# Patient Record
Sex: Male | Born: 1935 | Race: White | Hispanic: No | Marital: Married | Smoking: Former smoker
Health system: Southern US, Community
[De-identification: ages and names within clinical notes are randomized; demographics above are authoritative.]

## PROBLEM LIST (undated history)

## (undated) DIAGNOSIS — F329 Major depressive disorder, single episode, unspecified: Secondary | ICD-10-CM

## (undated) DIAGNOSIS — N529 Male erectile dysfunction, unspecified: Secondary | ICD-10-CM

## (undated) DIAGNOSIS — F411 Generalized anxiety disorder: Secondary | ICD-10-CM

## (undated) DIAGNOSIS — I10 Essential (primary) hypertension: Secondary | ICD-10-CM

## (undated) DIAGNOSIS — K8070 Calculus of gallbladder and bile duct without cholecystitis without obstruction: Secondary | ICD-10-CM

## (undated) DIAGNOSIS — D34 Benign neoplasm of thyroid gland: Secondary | ICD-10-CM

## (undated) DIAGNOSIS — R519 Headache, unspecified: Secondary | ICD-10-CM

## (undated) DIAGNOSIS — J449 Chronic obstructive pulmonary disease, unspecified: Secondary | ICD-10-CM

## (undated) DIAGNOSIS — J984 Other disorders of lung: Secondary | ICD-10-CM

## (undated) DIAGNOSIS — N183 Chronic kidney disease, stage 3 unspecified: Secondary | ICD-10-CM

## (undated) DIAGNOSIS — E785 Hyperlipidemia, unspecified: Secondary | ICD-10-CM

## (undated) DIAGNOSIS — R21 Rash and other nonspecific skin eruption: Secondary | ICD-10-CM

## (undated) DIAGNOSIS — R911 Solitary pulmonary nodule: Secondary | ICD-10-CM

## (undated) DIAGNOSIS — I4891 Unspecified atrial fibrillation: Secondary | ICD-10-CM

## (undated) DIAGNOSIS — E041 Nontoxic single thyroid nodule: Principal | ICD-10-CM

## (undated) DIAGNOSIS — F0390 Unspecified dementia without behavioral disturbance: Secondary | ICD-10-CM

## (undated) HISTORY — DX: Hyperlipidemia, unspecified: E78.5

## (undated) HISTORY — DX: Nontoxic single thyroid nodule: E04.1

## (undated) HISTORY — DX: Rash and other nonspecific skin eruption: R21

## (undated) HISTORY — DX: Solitary pulmonary nodule: R91.1

## (undated) HISTORY — DX: Chronic obstructive pulmonary disease, unspecified: J44.9

## (undated) HISTORY — DX: Calculus of gallbladder and bile duct without cholecystitis without obstruction: K80.70

## (undated) HISTORY — DX: Male erectile dysfunction, unspecified: N52.9

## (undated) HISTORY — DX: Major depressive disorder, single episode, unspecified: F32.9

## (undated) HISTORY — DX: Generalized anxiety disorder: F41.1

## (undated) HISTORY — DX: Other disorders of lung: J98.4

## (undated) HISTORY — DX: Benign neoplasm of thyroid gland: D34

---

## 1996-06-09 HISTORY — PX: CARDIAC CATHETERIZATION: SHX172

## 2008-05-17 ENCOUNTER — Encounter: Admission: RE | Admit: 2008-05-17 | Discharge: 2008-05-17 | Payer: Self-pay | Admitting: Family Medicine

## 2009-05-16 ENCOUNTER — Encounter: Payer: Self-pay | Admitting: Internal Medicine

## 2009-05-31 ENCOUNTER — Encounter: Payer: Self-pay | Admitting: Internal Medicine

## 2009-06-18 ENCOUNTER — Ambulatory Visit: Payer: Self-pay | Admitting: Internal Medicine

## 2009-06-18 DIAGNOSIS — J984 Other disorders of lung: Secondary | ICD-10-CM

## 2009-06-18 DIAGNOSIS — N529 Male erectile dysfunction, unspecified: Secondary | ICD-10-CM | POA: Insufficient documentation

## 2009-06-18 DIAGNOSIS — L989 Disorder of the skin and subcutaneous tissue, unspecified: Secondary | ICD-10-CM | POA: Insufficient documentation

## 2009-06-18 DIAGNOSIS — E785 Hyperlipidemia, unspecified: Secondary | ICD-10-CM

## 2009-06-18 HISTORY — DX: Hyperlipidemia, unspecified: E78.5

## 2009-06-18 HISTORY — DX: Other disorders of lung: J98.4

## 2009-06-18 HISTORY — DX: Male erectile dysfunction, unspecified: N52.9

## 2009-06-20 LAB — CONVERTED CEMR LAB
ALT: 25 units/L (ref 0–53)
AST: 21 units/L (ref 0–37)
Bilirubin Urine: NEGATIVE
CO2: 31 meq/L (ref 19–32)
Calcium: 9.8 mg/dL (ref 8.4–10.5)
Cholesterol: 244 mg/dL — ABNORMAL HIGH (ref 0–200)
Creatinine, Ser: 1.4 mg/dL (ref 0.4–1.5)
Creatinine,U: 250.4 mg/dL
Direct LDL: 159.8 mg/dL
Eosinophils Relative: 2.7 % (ref 0.0–5.0)
GFR calc non Af Amer: 52.99 mL/min (ref >60)
HCT: 49.4 % (ref 39.0–52.0)
Hemoglobin: 16.4 g/dL (ref 13.0–17.0)
Ketones, ur: NEGATIVE mg/dL
Lymphocytes Relative: 17.2 % (ref 12.0–46.0)
Lymphs Abs: 1.3 10*3/uL (ref 0.7–4.0)
Microalb Creat Ratio: 4 mg/g (ref 0.0–30.0)
Monocytes Relative: 8.4 % (ref 3.0–12.0)
Neutro Abs: 5.2 10*3/uL (ref 1.4–7.7)
PSA: 1.11 ng/mL (ref 0.10–4.00)
RBC: 5.41 M/uL (ref 4.22–5.81)
Sodium: 143 meq/L (ref 135–145)
Specific Gravity, Urine: >=1.03 (ref 1.000–1.030)
Total Protein: 7.4 g/dL (ref 6.0–8.3)
Triglycerides: 275 mg/dL — ABNORMAL HIGH (ref 0.0–149.0)
Urine Glucose: NEGATIVE mg/dL
WBC: 7.3 10*3/uL (ref 4.5–10.5)
pH: 5 (ref 5.0–8.0)

## 2009-06-21 ENCOUNTER — Ambulatory Visit: Payer: Self-pay | Admitting: Internal Medicine

## 2009-06-21 ENCOUNTER — Telehealth: Payer: Self-pay | Admitting: Internal Medicine

## 2009-06-21 DIAGNOSIS — IMO0002 Reserved for concepts with insufficient information to code with codable children: Secondary | ICD-10-CM

## 2009-06-21 DIAGNOSIS — M542 Cervicalgia: Secondary | ICD-10-CM | POA: Insufficient documentation

## 2009-06-22 DIAGNOSIS — F329 Major depressive disorder, single episode, unspecified: Secondary | ICD-10-CM

## 2009-06-22 DIAGNOSIS — F3289 Other specified depressive episodes: Secondary | ICD-10-CM

## 2009-06-22 DIAGNOSIS — F418 Other specified anxiety disorders: Secondary | ICD-10-CM

## 2009-06-22 HISTORY — DX: Other specified depressive episodes: F32.89

## 2009-06-22 HISTORY — DX: Major depressive disorder, single episode, unspecified: F32.9

## 2009-07-17 ENCOUNTER — Telehealth: Payer: Self-pay | Admitting: Internal Medicine

## 2009-07-19 ENCOUNTER — Ambulatory Visit: Payer: Self-pay | Admitting: Internal Medicine

## 2009-07-19 LAB — CONVERTED CEMR LAB
Albumin: 3.9 g/dL (ref 3.5–5.2)
Alkaline Phosphatase: 72 units/L (ref 39–117)
Cholesterol: 139 mg/dL (ref 0–200)
HDL: 43.8 mg/dL (ref >39.00)
LDL Cholesterol: 61 mg/dL (ref 0–99)
Total CHOL/HDL Ratio: 3
Total Protein: 7.1 g/dL (ref 6.0–8.3)
VLDL: 34.4 mg/dL (ref 0.0–40.0)

## 2009-07-25 ENCOUNTER — Ambulatory Visit: Payer: Self-pay | Admitting: Internal Medicine

## 2009-07-26 ENCOUNTER — Encounter: Payer: Self-pay | Admitting: Internal Medicine

## 2009-10-10 ENCOUNTER — Emergency Department (HOSPITAL_COMMUNITY): Admission: EM | Admit: 2009-10-10 | Discharge: 2009-10-10 | Payer: Self-pay | Admitting: Family Medicine

## 2009-10-22 ENCOUNTER — Encounter: Payer: Self-pay | Admitting: Internal Medicine

## 2009-10-22 ENCOUNTER — Telehealth: Payer: Self-pay | Admitting: Internal Medicine

## 2010-02-19 ENCOUNTER — Ambulatory Visit: Payer: Self-pay | Admitting: Internal Medicine

## 2010-02-19 ENCOUNTER — Encounter: Payer: Self-pay | Admitting: Internal Medicine

## 2010-02-20 ENCOUNTER — Telehealth: Payer: Self-pay | Admitting: Internal Medicine

## 2010-02-22 ENCOUNTER — Ambulatory Visit: Payer: Self-pay | Admitting: Internal Medicine

## 2010-02-23 LAB — CONVERTED CEMR LAB
BUN: 20 mg/dL (ref 6–23)
Calcium: 8.8 mg/dL (ref 8.4–10.5)
GFR calc non Af Amer: 55.15 mL/min (ref >60)
Glucose, Bld: 93 mg/dL (ref 70–99)
Potassium: 4.2 meq/L (ref 3.5–5.1)
Sodium: 141 meq/L (ref 135–145)

## 2010-03-04 ENCOUNTER — Telehealth: Payer: Self-pay | Admitting: Internal Medicine

## 2010-05-13 ENCOUNTER — Ambulatory Visit: Payer: Self-pay | Admitting: Internal Medicine

## 2010-05-13 DIAGNOSIS — M549 Dorsalgia, unspecified: Secondary | ICD-10-CM | POA: Insufficient documentation

## 2010-05-13 DIAGNOSIS — F411 Generalized anxiety disorder: Secondary | ICD-10-CM | POA: Insufficient documentation

## 2010-05-13 DIAGNOSIS — R5383 Other fatigue: Secondary | ICD-10-CM

## 2010-05-13 DIAGNOSIS — R5381 Other malaise: Secondary | ICD-10-CM

## 2010-05-13 HISTORY — DX: Generalized anxiety disorder: F41.1

## 2010-05-13 LAB — CONVERTED CEMR LAB
ALT: 19 units/L (ref 0–53)
Albumin: 4 g/dL (ref 3.5–5.2)
BUN: 15 mg/dL (ref 6–23)
Basophils Relative: 0.4 % (ref 0.0–3.0)
Bilirubin, Direct: 0.1 mg/dL (ref 0.0–0.3)
CO2: 32 meq/L (ref 19–32)
Chloride: 101 meq/L (ref 96–112)
Creatinine, Ser: 1.5 mg/dL (ref 0.4–1.5)
Eosinophils Absolute: 0.2 10*3/uL (ref 0.0–0.7)
Eosinophils Relative: 2.4 % (ref 0.0–5.0)
HCT: 45.8 % (ref 39.0–52.0)
Lymphs Abs: 1.2 10*3/uL (ref 0.7–4.0)
MCHC: 34.2 g/dL (ref 30.0–36.0)
MCV: 89.5 fL (ref 78.0–100.0)
Monocytes Absolute: 0.5 10*3/uL (ref 0.1–1.0)
Neutrophils Relative %: 74.7 % (ref 43.0–77.0)
Potassium: 4.1 meq/L (ref 3.5–5.1)
RBC: 5.11 M/uL (ref 4.22–5.81)
TSH: 2.32 microintl units/mL (ref 0.35–5.50)
Total Protein: 6.5 g/dL (ref 6.0–8.3)
WBC: 7.2 10*3/uL (ref 4.5–10.5)

## 2010-05-24 ENCOUNTER — Ambulatory Visit: Payer: Self-pay | Admitting: Internal Medicine

## 2010-06-24 ENCOUNTER — Ambulatory Visit: Payer: Self-pay | Admitting: Cardiology

## 2010-06-28 ENCOUNTER — Telehealth: Payer: Self-pay | Admitting: Internal Medicine

## 2010-06-28 DIAGNOSIS — K8070 Calculus of gallbladder and bile duct without cholecystitis without obstruction: Secondary | ICD-10-CM

## 2010-06-28 DIAGNOSIS — D34 Benign neoplasm of thyroid gland: Secondary | ICD-10-CM

## 2010-06-28 HISTORY — DX: Benign neoplasm of thyroid gland: D34

## 2010-06-28 HISTORY — DX: Calculus of gallbladder and bile duct without cholecystitis without obstruction: K80.70

## 2010-06-30 ENCOUNTER — Encounter: Payer: Self-pay | Admitting: Internal Medicine

## 2010-07-03 ENCOUNTER — Other Ambulatory Visit: Payer: Self-pay | Admitting: Internal Medicine

## 2010-07-03 DIAGNOSIS — K8020 Calculus of gallbladder without cholecystitis without obstruction: Secondary | ICD-10-CM

## 2010-07-03 DIAGNOSIS — E049 Nontoxic goiter, unspecified: Secondary | ICD-10-CM

## 2010-07-05 ENCOUNTER — Ambulatory Visit: Admit: 2010-07-05 | Payer: Self-pay | Admitting: Internal Medicine

## 2010-07-09 NOTE — Letter (Signed)
Summary: [facility]  [facility]   Imported By: Lester  07/12/2009 08:55:08  _____________________________________________________________________  External Attachment:    Type:   Image     Comment:   External Document

## 2010-07-09 NOTE — Progress Notes (Signed)
Summary: Letter  Phone Note Call from Patient Call back at Home Phone [phone]   Caller: Patient Summary of Call: pt's spouse called requesting a letter from MD stating that pt is under [facility]'s care and that he has had to go to ER. Pt's wife is requesting this to provide to [place] company to show financiall hardship so that she may be put on a payment plan. please advise Initial call taken by: Margaret Pyle, CMA,  Oct 22, 2009 3:48 PM  Follow-up for Phone Call        done hardcopy to [facility] side B - dahlia  Follow-up by: Corwin Levins MD,  Oct 22, 2009 5:43 PM  Additional Follow-up for Phone Call Additional follow up Details #1::        pt's spouse informed, Letter mailed per pt request Additional Follow-up by: Margaret Pyle, CMA,  Oct 23, 2009 8:19 AM

## 2010-07-09 NOTE — Letter (Signed)
Summary: [facility]  [facility]   Imported By: Lester Rheems 07/12/2009 08:56:38  _____________________________________________________________________  External Attachment:    Type:   Image     Comment:   External Document

## 2010-07-09 NOTE — Assessment & Plan Note (Signed)
Summary: FEVER/ COUGH/NWS   Vital Signs:  Patient profile:   75 year old male Height:      67.5 inches Weight:      192.38 pounds BMI:     29.79 O2 Sat:      94 % on Room air Temp:     99.4 degrees F oral Pulse rate:   73 / minute BP sitting:   120 / 70  (left arm) Cuff size:   regular  Vitals Entered By: Zella Ball Ewing CMA Duncan Dull) (February 19, 2010 1:59 PM)  O2 Flow:  Room air  CC: Cough, fever, congestion, body aches for 2 days/RE   CC:  Cough, fever, congestion, and body aches for 2 days/RE.  History of Present Illness: here with acute onset x 2 days above, with headache, fever, myalgias, general weakness and malaise as well as mild ST and prod cough greenish sputum;  Pt denies CP, worsening sob, doe, wheezing, orthopnea, pnd, worsening LE edema, palps, dizziness or syncope  Pt denies new neuro symptoms such as headache, facial or extremity weakness  No recent  wt loss, night sweats, loss of appetite or other constitutional symptoms   Has ongoing fatigue now worse, but denies worsening depressive symtpoms or suicidal ideation, or panic/anxiety  Problems Prior to Update: 1)  Bronchitis-acute  (ICD-466.0) 2)  Hepatotoxicity, Drug-induced, Risk of  (ICD-V58.69) 3)  Depression  (ICD-311) 4)  Neck Pain, Right  (ICD-723.1) 5)  Cellulitis, Arm  (ICD-682.3) 6)  Preventive Health Care  (ICD-V70.0) 7)  Skin Lesions, Multiple  (ICD-709.9) 8)  Erectile Dysfunction, Organic  (ICD-607.84) 9)  Hyperlipidemia  (ICD-272.4) 10)  Pulmonary Nodule  (ICD-518.89) 11)  COPD  (ICD-496)  Medications Prior to Update: 1)  Proair Hfa 108 (90 Base) Mcg/act Aers (Albuterol Sulfate) .... 2 Puffs Four Times Per Day As Needed For Shortness of Breath 2)  Symbicort 160-4.5 Mcg/act Aero (Budesonide-Formoterol Fumarate) .... 2 Puffs Two Times A Day 3)  Simvastatin 40 Mg Tabs (Simvastatin) .Marland Kitchen.. 1po Once Daily 4)  Citalopram Hydrobromide 10 Mg Tabs (Citalopram Hydrobromide) .Marland Kitchen.. 1po Once Daily  Current  Medications (verified): 1)  Proair Hfa 108 (90 Base) Mcg/act Aers (Albuterol Sulfate) .... 2 Puffs Four Times Per Day As Needed For Shortness of Breath 2)  Symbicort 160-4.5 Mcg/act Aero (Budesonide-Formoterol Fumarate) .... 2 Puffs Two Times A Day 3)  Simvastatin 40 Mg Tabs (Simvastatin) .Marland Kitchen.. 1po Once Daily 4)  Citalopram Hydrobromide 10 Mg Tabs (Citalopram Hydrobromide) .Marland Kitchen.. 1po Once Daily 5)  Augmentin 875-125 Mg Tabs (Amoxicillin-Pot Clavulanate) .... Generic - 1 By Mouth Two Times A Day 6)  Hydrocodone-Homatropine 5-1.5 Mg/51ml Syrp (Hydrocodone-Homatropine) .Marland Kitchen.. 1 Tsp By Mouth Q 6 Hrs As Needed Cough  Allergies (verified): No Known Drug Allergies  Past History:  Past Medical History: Last updated: 06/21/2009 COPD/emphysema right lung nodule Hyperlipidemia E.D.  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  , mild ill  Head:  normocephalic and atraumatic.   Eyes:  vision grossly intact, pupils equal, and pupils round.   Ears:  bilat tm's red, sinus nontender Nose:  nasal dischargemucosal pallor  and mucosal edema.   Mouth:  pharyngeal erythema and fair dentition.   Neck:  supple and no masses.   Lungs:  normal respiratory effort, R decreased breath sounds, and L decreased breath sounds.  but no wheezing Heart:  normal rate and regular rhythm.   Extremities:  no edema, no erythema    Impression & Recommendations:  Problem # 1:  BRONCHITIS-ACUTE (ICD-466.0)  His updated medication list for this problem includes:    Proair Hfa 108 (90 Base) Mcg/act Aers (Albuterol sulfate) .Marland Kitchen... 2 puffs four  times per day as needed for shortness of breath    Symbicort 160-4.5 Mcg/act Aero (Budesonide-formoterol fumarate) .Marland Kitchen... 2 puffs two times a day    Augmentin 875-125 Mg Tabs (Amoxicillin-pot clavulanate) .Marland Kitchen... Generic - 1 by mouth two times a day    Hydrocodone-homatropine 5-1.5 Mg/41ml Syrp (Hydrocodone-homatropine) .Marland Kitchen... 1 tsp by mouth q 6 hrs as needed cough  Orders: T-2 View CXR, Same Day (71020.5TC) treat as above, f/u any worsening signs or symptoms , cant r/o pna  - for cxr as well  Problem # 2:  COPD (ICD-496)  His updated medication list for this problem includes:    Proair Hfa 108 (90 Base) Mcg/act Aers (Albuterol sulfate) .Marland Kitchen... 2 puffs four times per day as needed for shortness of breath    Symbicort 160-4.5 Mcg/act Aero (Budesonide-formoterol fumarate) .Marland Kitchen... 2 puffs two times a day stable overall by hx and exam, ok to continue meds/tx as is   Problem # 3:  DEPRESSION (ICD-311)  His updated medication list for this problem includes:    Citalopram Hydrobromide 10 Mg Tabs (Citalopram hydrobromide) .Marland Kitchen... 1po once daily stable overall by hx and exam, ok to continue meds/tx as is   Complete Medication List: 1)  Proair Hfa 108 (90 Base) Mcg/act Aers (Albuterol sulfate) .... 2 puffs four times per day as needed for shortness of breath 2)  Symbicort 160-4.5 Mcg/act Aero (Budesonide-formoterol fumarate) .... 2 puffs two times a day 3)  Simvastatin 40 Mg Tabs (Simvastatin) .Marland Kitchen.. 1po once daily 4)  Citalopram Hydrobromide 10 Mg Tabs (Citalopram hydrobromide) .Marland Kitchen.. 1po once daily 5)  Augmentin 875-125 Mg Tabs (Amoxicillin-pot clavulanate) .... Generic - 1 by mouth two times a day 6)  Hydrocodone-homatropine 5-1.5 Mg/69ml Syrp (Hydrocodone-homatropine) .Marland Kitchen.. 1 tsp by mouth q 6 hrs as needed cough  Patient Instructions: 1)  Please take all new medications as prescribed 2)  Continue all previous medications as before this visit  3)  Please go to Radiology in the basement level for your X-Ray  today  4)  Please call the number on the [facility] Card for results of your testing  5)  Please schedule a follow-up appointment in Jan 2012 with CPX labs Prescriptions: HYDROCODONE-HOMATROPINE 5-1.5 MG/5ML SYRP (HYDROCODONE-HOMATROPINE) 1 tsp by mouth q 6 hrs as needed cough  #6oz x 1   Entered and Authorized by:   Corwin Levins MD   Signed by:   Corwin Levins MD on 02/19/2010   Method used:   Print then Give to Patient   RxID:   [id number] AUGMENTIN 875-125 MG TABS (AMOXICILLIN-POT CLAVULANATE) generic - 1 by mouth two times a day  #20 x 0   Entered and Authorized by:   Corwin Levins MD   Signed by:   Corwin Levins MD on 02/19/2010   Method used:   Electronically to        [facility]  [phone]* (retail)  [address]       [place], [place]  [address]       Ph: [phone] or [phone]       Fax: [phone]   RxID:   [id number]

## 2010-07-09 NOTE — Progress Notes (Signed)
----   Converted from flag ---- ---- 03/04/2010 2:29 PM, Corwin Levins MD wrote: noted, will try to d/w pt next visit  ---- 03/04/2010 2:29 PM, Shelbie Proctor wrote:  per [place] CT - [facility]  pt decline CT pt was scheduled for 03-01-2010 ------------------------------

## 2010-07-09 NOTE — Assessment & Plan Note (Signed)
Summary: not eating, not feeling well/cd   Vital Signs:  Patient profile:   75 year old male Height:      67 inches Weight:      188 pounds BMI:     29.55 O2 Sat:      97 % on Room air Temp:     97.8 degrees F oral Pulse rate:   100 / minute BP sitting:   140 / 80  (left arm) Cuff size:   regular  Vitals Entered ByZella Ball Ewing (June 21, 2009 11:28 AM)  O2 Flow:  Room air CC: headache,nauseated,no appetite   CC:  headache, nauseated, and no appetite.  History of Present Illness: here with acute onset x 2 days grad wosening redness, tender and swelling at the right upper arm site of previous immunizations at last visit;  feels poorly but o/w cannot be specfic;  no high fever or chills; has some slight ST and chest congestion wtih mild production with slight colored sputum but no blood and essentially no change from chronic AM symtpoms;  no increased CP, sob, wheezing, orthopnea, pnd or worsening LE edema.  has some assoc headache, nausea and decrased appetitie. Also mentions right post -lat neck soreness but only started this am and does not sound radicular.   Also menitons spiriva just does not seem to help at all.  Also venlafaxine generic very expensive with his current drug plan and cannot afford - still wtih significant depressive symtpoms but no suicidal ideation or increased anxiety or panic.  He is ok with starting the new statin but just has not started yet  Problems Prior to Update: 1)  Cellulitis, Arm  (ICD-682.3) 2)  Preventive Health Care  (ICD-V70.0) 3)  Skin Lesions, Multiple  (ICD-709.9) 4)  Erectile Dysfunction, Organic  (ICD-607.84) 5)  Hyperlipidemia  (ICD-272.4) 6)  Pulmonary Nodule  (ICD-518.89) 7)  COPD  (ICD-496)  Medications Prior to Update: 1)  Venlafaxine Hcl 37.5 Mg Xr24h-Cap (Venlafaxine Hcl) .Marland Kitchen.. 1 By Mouth Once Daily 2)  Proair Hfa 108 (90 Base) Mcg/act Aers (Albuterol Sulfate) .... 2 Puffs Four Times Per Day As Needed For Shortness of Breath 3)   Spiriva Handihaler 18 Mcg Caps (Tiotropium Bromide Monohydrate) .Marland Kitchen.. 1 Puff Once Daily 4)  Simvastatin 40 Mg Tabs (Simvastatin) .Marland Kitchen.. 1po Once Daily  Current Medications (verified): 1)  Citalopram Hydrobromide 10 Mg Tabs (Citalopram Hydrobromide) .Marland Kitchen.. 1 By Mouth Once Daily 2)  Proair Hfa 108 (90 Base) Mcg/act Aers (Albuterol Sulfate) .... 2 Puffs Four Times Per Day As Needed For Shortness of Breath 3)  Symbicort 160-4.5 Mcg/act Aero (Budesonide-Formoterol Fumarate) .... 2 Puffs Two Times A Day 4)  Simvastatin 40 Mg Tabs (Simvastatin) .Marland Kitchen.. 1po Once Daily 5)  Doxycycline Hyclate 100 Mg Caps (Doxycycline Hyclate) .Marland Kitchen.. 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  Depression  Review of Systems       all otherwise negative per pt -   Physical Exam  General:  alert and overweight-appearing.  , miid ill  Head:  normocephalic and atraumatic.   Eyes:  vision grossly intact, pupils equal,  and pupils round.   Ears:  R ear normal and L ear normal.   Nose:  no external deformity and no nasal discharge.   Mouth:  no gingival abnormalities and pharynx pink and moist.   Neck:  supple and no masses.   Lungs:  normal respiratory effort, R decreased breath sounds, and L decreased breath sounds.   Heart:  normal rate and regular rhythm.   Abdomen:  soft, non-tender, and normal bowel sounds.   Msk:  has so me mild right post lat paracervical tender, but  trapezoid tedner Extremities:  no edema, no erythema  Neurologic:  cranial nerves II-XII intact and strength normal in upper  extremities.   Skin:  approx 4x5 cm area red and tender to right deltoid area with minor induration, no fluctuation or abscess and no lymphag streaks.     Psych:  not anxious appearing and depressed affect.     Impression & Recommendations:  Problem # 1:  CELLULITIS, ARM (ICD-682.3)  His updated medication list for this problem includes:    Doxycycline Hyclate 100 Mg Caps (Doxycycline hyclate) .Marland Kitchen... 1 by mouth two times a day treat as above, f/u any worsening signs or symptoms   Problem # 2:  COPD (ICD-496)  His updated medication list for this problem includes:    Proair Hfa 108 (90 Base) Mcg/act Aers (Albuterol sulfate) .Marland Kitchen... 2 puffs four times per day as needed for shortness of breath    Symbicort 160-4.5 Mcg/act Aero (Budesonide-formoterol fumarate) .Marland Kitchen... 2 puffs two times a day treat as above, f/u any worsening signs or symptoms   Problem # 3:  NECK PAIN, RIGHT (ICD-723.1) mild msk strain - for tylenol as needed, do not suspect significant disc or radicular problems at this itme  Problem # 4:  DEPRESSION (ICD-311)  His updated medication list for this problem includes:    Citalopram Hydrobromide 10 Mg Tabs (Citalopram hydrobromide) .Marland Kitchen... 1 by mouth once daily treat as above, f/u any worsening signs or symptoms   Problem # 5:  HYPERLIPIDEMIA (ICD-272.4)  His updated medication list for this problem includes:    Simvastatin 40 Mg Tabs (Simvastatin) .Marland Kitchen... 1po once daily  Labs Reviewed: SGOT: 21 (06/18/2009)   SGPT: 25 (06/18/2009)   HDL:38.10 (06/18/2009)  Chol:244 (06/18/2009)  Trig:275.0 (06/18/2009) to start statin with f/u as per EMR  Complete Medication List: 1)  Citalopram Hydrobromide 10 Mg Tabs (Citalopram hydrobromide) .Marland Kitchen.. 1 by mouth once daily 2)  Proair Hfa 108 (90 Base) Mcg/act Aers (Albuterol sulfate) .... 2 puffs four times per day as needed for shortness of breath 3)  Symbicort 160-4.5 Mcg/act Aero (Budesonide-formoterol fumarate) .... 2 puffs two times a  day 4)  Simvastatin 40 Mg Tabs (Simvastatin) .Marland Kitchen.. 1po once daily 5)  Doxycycline Hyclate 100 Mg Caps (Doxycycline hyclate) .Marland Kitchen.. 1 by mouth two times a day  Patient Instructions: 1)  stop the spiriva 2)  stop the venlafaxine 3)  start the symbicort at 2 puffs twice per day 4)  Please take all new medications as prescribed  - the antibiotic, adn the citalopram, and the cholesterol medication 5)  Please schedule a follow-up appointment as recommended at your last office visit Prescriptions: DOXYCYCLINE HYCLATE 100 MG CAPS (DOXYCYCLINE HYCLATE) 1 by mouth two times a day  #20 x 0   Entered and Authorized by:   Corwin Levins MD   Signed by:   Corwin Levins MD on 06/21/2009   Method used:   Print then Give to Patient  RxID:   [id number] CITALOPRAM HYDROBROMIDE 10 MG TABS (CITALOPRAM HYDROBROMIDE) 1 by mouth once daily  #90 x 3   Entered and Authorized by:   Corwin Levins MD   Signed by:   Corwin Levins MD on 06/21/2009   Method used:   Print then Give to Patient   RxID:   [id number] SYMBICORT 160-4.5 MCG/ACT AERO (BUDESONIDE-FORMOTEROL FUMARATE) 2 puffs two times a day  #1 x 11   Entered and Authorized by:   Corwin Levins MD   Signed by:   Corwin Levins MD on 06/21/2009   Method used:   Print then Give to Patient   RxID:   [id number] SYMBICORT 160-4.5 MCG/ACT AERO (BUDESONIDE-FORMOTEROL FUMARATE) 2 puffs two times a day  #3 x 3   Entered and Authorized by:   Corwin Levins MD   Signed by:   Corwin Levins MD on 06/21/2009   Method used:   Print then Give to Patient   RxID:   [id number]

## 2010-07-09 NOTE — Progress Notes (Signed)
Summary: Derm appt declined   Phone Note Outgoing Call   Call placed by: Dagoberto Reef,  June 21, 2009 9:07 AM Summary of Call: Dr Jonny Ruiz, Called pt about appt with Dr Michaell Cowing ([facility]) 06/28/09 @ 10:30am pt said it was healed and declined appt.  Initial call taken by: Dagoberto Reef,  June 21, 2009 9:09 AM  Follow-up for Phone Call        noted Follow-up by: Corwin Levins MD,  June 21, 2009 12:49 PM

## 2010-07-09 NOTE — Assessment & Plan Note (Signed)
Summary: freq falls/#/cd   Vital Signs:  Patient profile:   75 year old male Height:      67 inches Weight:      191.38 pounds BMI:     30.08 O2 Sat:      98 % on Room air Temp:     96.8 degrees F oral Pulse rate:   67 / minute BP sitting:   140 / 88  (left arm) Cuff size:   large  O2 Flow:  Room air CC: frequent falls/discuss citalopram/re   CC:  frequent falls/discuss citalopram/re.  History of Present Illness: no recent falls but fearful he may do so;  Pt denies new CP, or worsening sob, doe, wheezing, orthopnea, pnd, worsening LE edema, palps, dizziness or syncope   Pt denies new neuro symptoms such as headache, facial or extremity weakness   Arm redness resolved, and no fever, swelling resolved.  Overall good compliacne with meds, tolerating well.  Depressive symptoms improved, without suicidal ideation and improved mood, much less irritability.  No increased anxiety.    Problems Prior to Update: 1)  Hepatotoxicity, Drug-induced, Risk of  (ICD-V58.69) 2)  Depression  (ICD-311) 3)  Neck Pain, Right  (ICD-723.1) 4)  Cellulitis, Arm  (ICD-682.3) 5)  Preventive Health Care  (ICD-V70.0) 6)  Skin Lesions, Multiple  (ICD-709.9) 7)  Erectile Dysfunction, Organic  (ICD-607.84) 8)  Hyperlipidemia  (ICD-272.4) 9)  Pulmonary Nodule  (ICD-518.89) 10)  COPD  (ICD-496)  Medications Prior to Update: 1)  Citalopram Hydrobromide 10 Mg Tabs (Citalopram Hydrobromide) .Marland Kitchen.. 1 By Mouth Once Daily 2)  Proair Hfa 108 (90 Base) Mcg/act Aers (Albuterol Sulfate) .... 2 Puffs Four Times Per Day As Needed For Shortness of Breath 3)  Symbicort 160-4.5 Mcg/act Aero (Budesonide-Formoterol Fumarate) .... 2 Puffs Two Times A Day 4)  Simvastatin 40 Mg Tabs (Simvastatin) .Marland Kitchen.. 1po Once Daily 5)  Doxycycline Hyclate 100 Mg Caps (Doxycycline Hyclate) .Marland Kitchen.. 1 By Mouth Two Times A Day  Current Medications (verified): 1)  Proair Hfa 108 (90 Base) Mcg/act Aers (Albuterol Sulfate) .... 2 Puffs Four Times Per Day  As Needed For Shortness of Breath 2)  Symbicort 160-4.5 Mcg/act Aero (Budesonide-Formoterol Fumarate) .... 2 Puffs Two Times A Day 3)  Simvastatin 40 Mg Tabs (Simvastatin) .Marland Kitchen.. 1po Once Daily 4)  Citalopram Hydrobromide 10 Mg Tabs (Citalopram Hydrobromide) .Marland Kitchen.. 1po Once Daily  Allergies (verified): No Known Drug Allergies  Past History:  Past Medical History: Last updated: 06/21/2009 COPD/emphysema right lung nodule Hyperlipidemia E.D.  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   Head:  normocephalic and atraumatic.   Eyes:  vision grossly intact, pupils equal, and pupils round.   Ears:  R ear normal and L ear normal.   Nose:  no external deformity and no nasal discharge.   Mouth:  no gingival abnormalities and pharynx pink and moist.   Neck:  supple and no masses.   Lungs:  normal respiratory effort, R decreased breath sounds, and L decreased breath sounds. , no wheezes  Heart:  normal rate and regular rhythm.   Extremities:  no edema,  no erythema  Skin:  color normal and no rashes.     Impression & Recommendations:  Problem # 1:  HYPERLIPIDEMIA (ICD-272.4)  His updated medication list for this problem includes:    Simvastatin 40 Mg Tabs (Simvastatin) .Marland Kitchen... 1po once daily  Orders: TLB-Lipid Panel (80061-LIPID)  Labs Reviewed: SGOT: 21 (06/18/2009)   SGPT: 25 (06/18/2009)   HDL:38.10 (06/18/2009)  Chol:244 (06/18/2009)  Trig:275.0 (06/18/2009) stable overall by hx and exam, ok to continue meds/tx as is , Pt to continue diet efforts, good med tolerance; to check labs - goal LDL less  than 100   Problem # 2:  CELLULITIS, ARM (ICD-682.3)  The following medications were removed from the medication list:    Doxycycline Hyclate 100 Mg Caps (Doxycycline hyclate) .Marland Kitchen... 1 by mouth two times a day resolved, reassured  Problem # 3:  COPD (ICD-496)  His updated medication list for this problem includes:    Proair Hfa 108 (90 Base) Mcg/act Aers (Albuterol sulfate) .Marland Kitchen... 2 puffs four times per day as needed for shortness of breath    Symbicort 160-4.5 Mcg/act Aero (Budesonide-formoterol fumarate) .Marland Kitchen... 2 puffs two times a day for PFT's, o/w stable overall by hx and exam, ok to continue meds/tx as is   Orders: Misc. Referral (Misc. Ref)  Problem # 4:  DEPRESSION (ICD-311)  The following medications were removed from the medication list:    Citalopram Hydrobromide 10 Mg Tabs (Citalopram hydrobromide) .Marland Kitchen... 1 by mouth once daily His updated medication list for this problem includes:    Citalopram Hydrobromide 10 Mg Tabs (Citalopram hydrobromide) .Marland Kitchen... 1po once daily improved, Continue all previous medications as before this visit   Complete Medication List: 1)  Proair Hfa 108 (90 Base) Mcg/act Aers (Albuterol sulfate) .... 2 puffs four times per day as needed for shortness of breath 2)  Symbicort 160-4.5 Mcg/act Aero (Budesonide-formoterol fumarate) .... 2 puffs two times a day 3)  Simvastatin 40 Mg Tabs (Simvastatin) .Marland Kitchen.. 1po once daily 4)  Citalopram Hydrobromide 10 Mg Tabs (Citalopram hydrobromide) .Marland Kitchen.. 1po once daily  Other Orders: TLB-Hepatic/Liver Function Pnl (80076-HEPATIC)  Patient Instructions: 1)  Continue all previous medications as before this visit  2)  You will be contacted about the referral(s) to: lung testing (pulmonary function tests) 3)  Please go to the Lab in the basement for your blood and/or urine tests today  4)  Please schedule a follow-up appointment in 6 months or sooner if needed Prescriptions: SYMBICORT 160-4.5 MCG/ACT AERO  (BUDESONIDE-FORMOTEROL FUMARATE) 2 puffs two times a day  #1 x 11   Entered and Authorized by:   Corwin Levins MD   Signed by:   Corwin Levins MD on 07/19/2009   Method used:   Print then Give to Patient   RxID:   [id number] PROAIR HFA 108 (90 BASE) MCG/ACT AERS (ALBUTEROL SULFATE) 2 puffs four times per day as needed for shortness of breath  #1 x 11   Entered and Authorized by:   Corwin Levins MD   Signed by:   Corwin Levins MD on 07/19/2009   Method used:   Print then Give to Patient   RxID:   [id number]

## 2010-07-09 NOTE — Letter (Signed)
Summary: Generic Letter  [facility]  [address] [place], [place] [address]   Phone: [phone]  Fax: [phone]    10/22/2009  JAVION HOLMER [address] [place], [place]  [address]  Dear Mr. Artiga,       This letter is to certify that you are married to Donnetta Hail, and you are in fact an active patient of [employer].  You have been determined to suffer from multiple medical conditions, including severe lung disease, such that termination of home utility service will result in negative  immediate effect on your health.         I hope this letter is helpful in support of your ability to remain in a home situation with active utility services.      Sincerely,   Oliver Barre MD

## 2010-07-09 NOTE — Assessment & Plan Note (Signed)
Summary: tired/weak,no pep/cd   Vital Signs:  Patient profile:   75 year old male Height:      67.5 inches Weight:      193.38 pounds BMI:     29.95 O2 Sat:      94 % on Room air Temp:     98 degrees F oral Pulse rate:   65 / minute BP sitting:   120 / 62  (left arm) Cuff size:   regular  Vitals Entered By: Zella Ball Ewing CMA (AAMA) (May 13, 2010 9:05 AM)  O2 Flow:  Room air  CC: SOB, no energy/RE   CC:  SOB and no energy/RE.  History of Present Illness: here to f/u with family present;  overall doing ok but c/o increased fatigue and no energy , sleeping more in the past month;  Pt denies CP,  orthopnea, pnd, worsening LE edema, palps, dizziness or syncope , but has had sob/doe but no wheezing or chest tightness.  Pt denies new neuro symptoms such as headache, facial or extremity weakness  Pt denies polydipsia, polyuria   Overall good compliance with meds, trying to follow low chol diet, wt stable, little excercise however .  No fever, wt loss, night sweats, loss of appetite or other constitutional symptoms Overall good compliance with meds, and good tolerability. Denies worsening depressive symptoms, suicidal ideation, or panic, but has ongoing anxiety , maybe a bit worse in the past few wks..   Due for f/u ct for pulm nodule.  No cough or hemoptysis. Does also have lower back pain , more on the left than right, but no radiation, LE pain/weak/numb, gait change or falls though has less stamina recently.  No bowel or bladder change.    Preventive Screening-Counseling & Management      Drug Use:  no.    Problems Prior to Update: 1)  Fatigue  (ICD-780.79) 2)  Back Pain  (ICD-724.5) 3)  Anxiety  (ICD-300.00) 4)  Pulmonary Nodule, Right Lower Lobe  (ICD-518.89) 5)  Hepatotoxicity, Drug-induced, Risk of  (ICD-V58.69) 6)  Depression  (ICD-311) 7)  Neck Pain, Right  (ICD-723.1) 8)  Cellulitis, Arm  (ICD-682.3) 9)  Preventive Health Care  (ICD-V70.0) 10)  Skin Lesions, Multiple   (ICD-709.9) 11)  Erectile Dysfunction, Organic  (ICD-607.84) 12)  Hyperlipidemia  (ICD-272.4) 13)  Pulmonary Nodule  (ICD-518.89) 14)  COPD  (ICD-496)  Medications Prior to Update: 1)  Proair Hfa 108 (90 Base) Mcg/act Aers (Albuterol Sulfate) .... 2 Puffs Four Times Per Day As Needed For Shortness of Breath 2)  Symbicort 160-4.5 Mcg/act Aero (Budesonide-Formoterol Fumarate) .... 2 Puffs Two Times A Day 3)  Simvastatin 40 Mg Tabs (Simvastatin) .Marland Kitchen.. 1po Once Daily 4)  Citalopram Hydrobromide 10 Mg Tabs (Citalopram Hydrobromide) .Marland Kitchen.. 1po Once Daily 5)  Augmentin 875-125 Mg Tabs (Amoxicillin-Pot Clavulanate) .... Generic - 1 By Mouth Two Times A Day 6)  Hydrocodone-Homatropine 5-1.5 Mg/32ml Syrp (Hydrocodone-Homatropine) .Marland Kitchen.. 1 Tsp By Mouth Q 6 Hrs As Needed Cough  Current Medications (verified): 1)  Proair Hfa 108 (90 Base) Mcg/act Aers (Albuterol Sulfate) .... 2 Puffs Four Times Per Day As Needed For Shortness of Breath 2)  Symbicort 160-4.5 Mcg/act Aero (Budesonide-Formoterol Fumarate) .... 2 Puffs Two Times A Day 3)  Simvastatin 40 Mg Tabs (Simvastatin) .Marland Kitchen.. 1po Once Daily 4)  Citalopram Hydrobromide 10 Mg Tabs (Citalopram Hydrobromide) .Marland Kitchen.. 1po Once Daily 5)  Clonazepam 0.5 Mg Tbdp (Clonazepam) .Marland Kitchen.. 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   Head:  normocephalic and atraumatic.   Eyes:  vision grossly intact, pupils equal, and pupils round.   Ears:  R ear normal and L ear normal.   Nose:  no external deformity and no nasal discharge.   Mouth:  no gingival abnormalities and pharynx pink and moist.   Neck:  supple and no masses.   Lungs:  normal respiratory effort, R decreased breath sounds, and L decreased breath sounds but no wheezing.   Heart:  normal rate and regular rhythm.   Abdomen:  soft, non-tender, and normal bowel sounds.   Msk:  no joint tenderness and no joint swelling.  , no spine tender or signifcant flank or paravertebral tender or swelling Extremities:  no edema, no erythema  Neurologic:  strength normal in all extremities and DTRs symmetrical and normal.   Skin:  color normal and no rashes.   Psych:  not depressed appearing and moderately anxious.     Impression & Recommendations:  Problem # 1:  PULMONARY NODULE, RIGHT LOWER LOBE (ICD-518.89)  ok for ct chest - pt consents now, and refer to pulm  Orders: Radiology Referral (Radiology) Pulmonary Referral (Pulmonary)  Problem # 2:  COPD (ICD-496)  His updated medication list for this problem includes:    Proair Hfa 108 (90 Base) Mcg/act Aers (Albuterol sulfate) .Marland Kitchen... 2 puffs four times per day as needed for shortness of breath    Symbicort 160-4.5 Mcg/act Aero (Budesonide-formoterol fumarate) .Marland Kitchen... 2 puffs two times a day stable overall by hx and exam, ok to continue meds/tx as is  Orders: Pulmonary Referral (Pulmonary)  Problem # 3:  ANXIETY (ICD-300.00)  His updated medication list for this problem includes:    Citalopram Hydrobromide 10 Mg Tabs (Citalopram hydrobromide) .Marland Kitchen... 1po once daily    Clonazepam 0.5 Mg Tbdp (Clonazepam) .Marland Kitchen... 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 2 puffs four times per day as needed for shortness of breath 2)  Symbicort 160-4.5 Mcg/act Aero (Budesonide-formoterol fumarate) .... 2 puffs two times a day 3)  Simvastatin 40 Mg Tabs (Simvastatin) .Marland Kitchen.. 1po once daily 4)  Citalopram Hydrobromide 10 Mg Tabs (Citalopram hydrobromide) .Marland Kitchen.. 1po once daily 5)  Clonazepam 0.5 Mg Tbdp (Clonazepam) .Marland Kitchen.. 1po two times a day as needed nerves  Patient Instructions: 1)  Please take all new medications as prescribed - the clonazepam (especially take before the CT scan) 2)  Continue all previous medications as before this visit 3)  You will be contacted about the referral(s) to: pulmonary, and CT scan 4)  Please go to the Lab in the basement for your blood and/or urine tests today 5)  Please call the number on the [facility] Card for results of your testing 6)  you can also take tylenol arthritis (or its generic) for pain 7)  Please schedule a follow-up appointment in 6 months for CPX with labs  (ok to cancel any other appt before then), or soone if needed Prescriptions: CLONAZEPAM 0.5 MG TBDP (CLONAZEPAM) 1po two times a day as needed nerves  #60 x 2   Entered and Authorized by:   Corwin Levins MD   Signed by:   Corwin Levins MD on 05/13/2010   Method used:   Print then Give to Patient   RxID:   [id number]    Orders Added: 1)  TLB-BMP (Basic Metabolic Panel-BMET) [80048-METABOL] 2)   TLB-CBC Platelet - w/Differential [85025-CBCD] 3)  TLB-Hepatic/Liver Function Pnl [80076-HEPATIC] 4)  TLB-TSH (Thyroid Stimulating Hormone) [14782-NFA] 5)  Radiology Referral [Radiology] 6)  Pulmonary Referral [Pulmonary] 7)  Est. Patient Level IV [21308]

## 2010-07-09 NOTE — Assessment & Plan Note (Signed)
Summary: NEW/MEDICARE/#/LB   Vital Signs:  Patient profile:   75 year old male Height:      67.5 inches Weight:      191 pounds BMI:     29.58 O2 Sat:      97 % on Room air Temp:     97 degrees F oral Pulse rate:   74 / minute BP sitting:   164 / 100  (left arm) Cuff size:   regular  Vitals Entered ByMarland Kitchen Zella Ball Ewing (June 18, 2009 1:10 PM)  O2 Flow:  Room air  CC: new pt/Re   CC:  new pt/Re.  History of Present Illness: overall doing ok - here to establish;  Pt denies CP, sob, doe, wheezing, orthopnea, pnd, worsening LE edema, palps, dizziness or syncope  Pt denies new neuro symptoms such as headache, facial or extremity weakness Also with recurrent LBP.  No fever, wt loss, night sweats; sometimes sleeping more during the day.    Preventive Screening-Counseling & Management  Alcohol-Tobacco     Smoking Status: quit  Problems Prior to Update: None  Medications Prior to Update: 1)  None  Current Medications (verified): 1)  Venlafaxine Hcl 37.5 Mg Xr24h-Cap (Venlafaxine Hcl) .Marland Kitchen.. 1 By Mouth Once Daily 2)  Proair Hfa 108 (90 Base) Mcg/act Aers (Albuterol Sulfate) .... 2 Puffs Four Times Per Day As Needed For Shortness of Breath 3)  Spiriva Handihaler 18 Mcg Caps (Tiotropium Bromide Monohydrate) .Marland Kitchen.. 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   Past Surgical History: s/o heart cath  - normal per pt 1998   Family History: Reviewed history and no changes required. father with colon cancer sister died after knee surgury  Social  History: Reviewed history and no changes required. 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         all otherwise negative per pt except for recurring skin sores to scalp and elsewhere  Physical Exam  General:  alert and overweight-appearing.   Head:  normocephalic and atraumatic.   Eyes:  vision grossly intact, pupils equal, and pupils round.   Ears:  R ear normal and L ear normal.   Nose:  no external deformity and no nasal discharge.   Mouth:  no gingival abnormalities and pharynx pink and moist.   Neck:  supple and no masses.   Lungs:  normal respiratory effort and normal breath sounds.   Heart:  normal rate and regular rhythm.   Abdomen:  soft, non-tender, and normal bowel sounds.   Msk:  no joint tenderness and no joint swelling.   Extremities:  no edema, no erythema  Neurologic:  cranial nerves II-XII intact and strength normal  in all extremities.   Skin:  several 3 to 5 mm open sores to scalp adn right arm   Impression & Recommendations:  Problem # 1:  Preventive Health Care (ICD-V70.0)  Overall doing well, up to date, counseled on routine health concerns for screening and prevention, immunizations up to date or declined, labs ordered  Orders: TLB-BMP (Basic Metabolic Panel-BMET) (80048-METABOL) TLB-CBC Platelet - w/Differential (85025-CBCD) TLB-Hepatic/Liver Function Pnl  (80076-HEPATIC) TLB-Lipid Panel (80061-LIPID) TLB-TSH (Thyroid Stimulating Hormone) (84443-TSH) TLB-PSA (Prostate Specific Antigen) (84153-PSA) TLB-Udip ONLY (81003-UDIP)  Problem # 2:  SKIN LESIONS, MULTIPLE (ICD-709.9)  Orders: Dermatology Referral (Derma) refer derm  Problem # 3:  PULMONARY NODULE (ICD-518.89)  Orders: T-2 View CXR, Same Day (71020.5TC) due for f/u after dec 2009 cxr  Problem # 4:  COPD (ICD-496)  His updated medication list for this problem includes:    Proair Hfa 108 (90 Base) Mcg/act Aers (Albuterol sulfate) .Marland Kitchen... 2 puffs four times per day as needed for shortness of breath    Spiriva Handihaler 18 Mcg Caps (Tiotropium bromide monohydrate) .Marland Kitchen... 1 puff once daily .start treat as above, f/u any worsening signs or symptoms   Complete Medication List: 1)  Venlafaxine Hcl 37.5 Mg Xr24h-cap (Venlafaxine hcl) .Marland Kitchen.. 1 by mouth once daily 2)  Proair Hfa 108 (90 Base) Mcg/act Aers (Albuterol sulfate) .... 2 puffs four times per day as needed for shortness of breath 3)  Spiriva Handihaler 18 Mcg Caps (Tiotropium bromide monohydrate) .Marland Kitchen.. 1 puff once daily  Other Orders: TLB-Microalbumin/Creat Ratio, Urine (82043-MALB)  Patient Instructions: 1)  Please go to Radiology in the basement level for your X-Ray today  2)  Please go to the Lab in the basement for your blood and/or urine tests today 3)  Continue all previous medications as before this visit 4)  you had the flu shot, tetanus and the pneumonia shot today 5)  please call if yoiu change your mind  about  the colonoscopy 6)  please stop at the front desk to sign the Release of Information Form 7)  You will be contacted about the referral(s) to: Dermatology 8)  Please schedule a follow-up appointment in 3 months. Prescriptions: SPIRIVA HANDIHALER 18 MCG CAPS (TIOTROPIUM BROMIDE MONOHYDRATE) 1 puff once daily  #30 x 11   Entered and Authorized by:   Corwin Levins MD   Signed by:   Corwin Levins MD on  06/18/2009   Method used:   Electronically to        [facility]  [phone]* (retail)       [address]       [place], [place]  [address]       Ph: [phone] or [phone]       Fax: [phone]   RxID:   [id number] PROAIR HFA 108 (90 BASE) MCG/ACT AERS (ALBUTEROL SULFATE) 2 puffs four times per day as needed for shortness of breath  #1 x 11   Entered and Authorized by:   Corwin Levins MD   Signed by:   Corwin Levins MD on 06/18/2009   Method used:   Electronically to        [facility]  [phone]* (retail)       [address]       [place], [place]  [address]       Ph: [phone] or [phone]       Fax:  [phone]   RxID:   [id number] VENLAFAXINE HCL 37.5 MG XR24H-CAP (VENLAFAXINE HCL) 1 by mouth once daily  #90 x 3   Entered and Authorized by:   Corwin Levins MD   Signed by:   Corwin Levins MD on 06/18/2009   Method used:   Electronically to        [facility]  [phone]* (retail)       [address]       [place], [place]  [address]       Ph: [phone] or [phone]       Fax: [phone]   RxID:   [id number]   Appended Document: Immunization Entry     Immunization History:  Influenza Immunization History:    Influenza:  historical (06/18/2009)

## 2010-07-09 NOTE — Miscellaneous (Signed)
Summary: Orders Update pft charges  Clinical Lists Changes  Orders: Added new Service order of Carbon Monoxide diffusing w/capacity (94720) - Signed Added new Service order of Lung Volumes (94240) - Signed Added new Service order of Spirometry (Pre & Post) (94060) - Signed 

## 2010-07-09 NOTE — Progress Notes (Signed)
Summary: Dizziness/fallen  Phone Note Call from Patient Call back at Home Phone [phone]   Caller: Spouse Summary of Call: Patient spouse left message on triage that patient will be having labs on Friday, they would like to know if he should fast. --I will let them know to fast.  Patient c/o dizzy spells and has fallen. Should we bring patient into office for eval prior to those labs or other recommendations? Please advise. Initial call taken by: Lucious Groves,  July 17, 2009 4:02 PM  Follow-up for Phone Call        should prob have visit this wk, labs can be considered at time of OV Follow-up by: Corwin Levins MD,  July 17, 2009 4:09 PM  Additional Follow-up for Phone Call Additional follow up Details #1::        Patient spouse notified and will make appt. Additional Follow-up by: Lucious Groves,  July 17, 2009 4:30 PM

## 2010-07-09 NOTE — Miscellaneous (Signed)
Summary: Orders Update   Clinical Lists Changes  Orders: Added new Referral order of Radiology Referral (Radiology) - Signed 

## 2010-07-09 NOTE — Progress Notes (Signed)
Summary: Call Report  Phone Note From Other Clinic   Caller: Nurse [facility] [phone] Summary of Call: Chest XRAY New RT lower lobe pulmonary nodule suspected, CT Chest w constrast is suggested for further eval Initial call taken by: Margaret Pyle, CMA,  February 20, 2010 8:27 AM  Follow-up for Phone Call        already addressed - see emr Follow-up by: Corwin Levins MD,  February 20, 2010 8:55 AM

## 2010-07-10 ENCOUNTER — Encounter: Payer: Self-pay | Admitting: Internal Medicine

## 2010-07-11 NOTE — Assessment & Plan Note (Signed)
Summary: pulmonary nodule/mhh   Visit Type:  Initial Consult Copy to:  Dr. Oliver Barre Primary Provider/Referring Provider:  Dr. Oliver Barre  CC:  Pt here for abnormal cxr. pt has CT set fro 05-27-10 but did not have this done. Marland Kitchen  History of Present Illness: June 21, 2010:  75 year old male. Ex-38 pack smoker. Referred by Dr Jonny Ruiz. Wife sasy there is a spot in lung. States it has gotten worse but they do not know any other details. REview of CXRs 02/19/2010 suggests possible RLL nodule that is new since 06/18/2009.  He does admit to chronic, insidiious onset dyspnea on exertion. PResent for 2 years. Did not hvae dyspnea when they moved from [place] 4 years ago.  Dyspnea occurs when he walks "1/4 mile" using a stick. Dyspnea relieved by rest. Stable since onset. Diagnosd as COPD by Dr Jonny Ruiz per wife and per notes. PFT 07/25/2009 showed FEv1 1.8L/66%  with 11% BD response and DLCO 90%. Has been on symbicort for 18 months - 24 months which he states is helping him. Denies associated cough, chest pain, hemotpysis, fever, chills, weight loss, edema, orthopnea, paroxysmal noctural dyspnea.    Preventive Screening-Counseling & Management  Alcohol-Tobacco     Smoking Status: quit     Packs/Day: 1.0     Year Started: 1953     Year Quit: 1991     Pack years: 79  Current Medications (verified): 1)  Proair Hfa 108 (90 Base) Mcg/act Aers (Albuterol Sulfate) .... 2 Puffs Four Times Per Day As Needed For Shortness of Breath 2)  Symbicort 160-4.5 Mcg/act Aero (Budesonide-Formoterol Fumarate) .... 2 Puffs Two Times A Day 3)  Simvastatin 40 Mg Tabs (Simvastatin) .Marland Kitchen.. 1po Once Daily 4)  Citalopram Hydrobromide 10 Mg Tabs (Citalopram Hydrobromide) .Marland Kitchen.. 1po Once Daily 5)  Clonazepam 0.5 Mg Tbdp (Clonazepam) .Marland Kitchen.. 1po Two Times A Day As Needed Nerves 6)  Aspirin 81 Mg Tbec (Aspirin) .... 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  Also smoke pipe in 1969 and quit in 1991 states smoked pipe several times a day.  Alcohol use-yes - rare Drug use-no Packs/Day:  1.0 Pack years:  38  Review of Systems       The patient complains of shortness of breath with activity, non-productive cough, and loss of appetite.  The patient denies shortness of breath at rest, productive cough, coughing up blood, chest pain, irregular heartbeats, acid heartburn, indigestion, weight change, abdominal pain, difficulty swallowing, sore throat, tooth/dental problems, headaches, nasal congestion/difficulty breathing through nose, sneezing, itching, ear ache, anxiety, depression, hand/feet swelling, joint stiffness or pain, rash, change in color of mucus, and fever.    Vital Signs:  Patient profile:   75 year old male Height:      67 inches Weight:      199 pounds BMI:     31.28 O2 Sat:      97 % on Room air Temp:     97.8 degrees F oral Pulse rate:   68 / minute BP sitting:   140 / 82  (right arm) Cuff size:   regular  Vitals Entered By: Carron Curie CMA (June 21, 2010 2:01 PM)  O2 Flow:  Room air  Serial Vital Signs/Assessments:  Comments: Ambulatory Pulse Oximetry  Resting; HR__64___  02 Sat__94___  Lap1 (185 feet)   HR_79____   02 Sat__93___ Lap2 (185 feet)   HR__87___   02 Sat__90___    Lap3 (185 feet)   HR___89__   02 Sat__90___  _x__Test Completed without Difficulty ___Test Stopped due to:   By: Carron Curie CMA   CC: Pt here for abnormal cxr. pt has CT set fro 05-27-10 but did not have this done.  Comments Medications reviewed with patient Carron Curie CMA  June 21, 2010 2:03 PM Daytime phone number verified with patient.    Physical Exam  General:  well developed, well nourished, in no acute distressobese.   Head:  normocephalic and atraumatic Eyes:  PERRLA/EOM intact;  conjunctiva and sclera clear Ears:  TMs intact and clear with normal canals Nose:  no deformity, discharge, inflammation, or lesions Mouth:  no deformity or lesions Neck:  no masses, thyromegaly, or abnormal cervical nodes Chest Wall:  no deformities noted Lungs:  clear bilaterally to auscultation and percussion Heart:  regular rate and rhythm, S1, S2 without murmurs, rubs, gallops, or clicks Abdomen:  bowel sounds positive; abdomen soft and non-tender without masses, or organomegaly Msk:  no deformity or scoliosis noted with normal posture Pulses:  pulses normal Extremities:  no clubbing, cyanosis, edema, or deformity noted Neurologic:  CN II-XII grossly intact with normal reflexes, coordination, muscle strength and tone Skin:  intact without lesions or rashes Cervical Nodes:  no significant adenopathy Axillary Nodes:  no significant adenopathy Psych:  alert and cooperative; normal mood and affect; normal attention span and concentration   CXR  Procedure date:  02/19/2010  Findings:      REview of CXRs 02/19/2010 suggests possible RLL nodule that is new since 06/21/2009  Comments:      independently reviwed  Impression & Recommendations:  Problem # 1:  PULMONARY NODULE (ICD-518.89) Assessment New  will need CT chest to assess nodule prsence. I am not sure I am connvinced about nodule on CXR  Orders: Consultation Level V (60454)  Problem # 2:  C O P D (ICD-496) Assessment: New reassess lung function using spirometry at followup get alpha 1 check will need pulmonary rehab  Medications Added to Medication List This Visit: 1)  Aspirin 81 Mg Tbec (Aspirin) .... Take 1 tablet by mouth once a day  Other Orders: Radiology Referral (Radiology)  Patient Instructions: 1)  please have spirometry at next visit 2)  please have CT chest  3)  return for followup after above

## 2010-07-11 NOTE — Progress Notes (Signed)
Summary: needs [place] and spiro  Phone Note Outgoing Call   Summary of Call: gave ct report - no nodule but needs [place] for thyroid gland and gall bladder. needs spirometry with tammy and then see me Initial call taken by: Kalman Shan MD,  June 28, 2010 5:57 PM  Follow-up for Phone Call        [place] scheduled for 07-05-10 as well as spirometry. pt set to see MR as well on 07-05-10 at1:50. Carron Curie CMA  July 02, 2010 10:08 AM   New Problems: CALCU GALLBLADD&BD W/O CHOLCYST W/O MENTION OBST (ICD-574.90) BENIGN NEOPLASM OF THYROID GLANDS (ICD-226)   New Problems: CALCU GALLBLADD&BD W/O CHOLCYST W/O MENTION OBST (ICD-574.90) BENIGN NEOPLASM OF THYROID GLANDS (ICD-226)

## 2010-07-17 ENCOUNTER — Encounter: Payer: Self-pay | Admitting: Internal Medicine

## 2010-07-17 ENCOUNTER — Ambulatory Visit (HOSPITAL_COMMUNITY)
Admission: RE | Admit: 2010-07-17 | Discharge: 2010-07-17 | Disposition: A | Discharge status: Home / Self Care | Payer: Medicare HMO | Source: Ambulatory Visit | Attending: Internal Medicine | Admitting: Internal Medicine

## 2010-07-17 ENCOUNTER — Other Ambulatory Visit (HOSPITAL_COMMUNITY): Payer: Self-pay

## 2010-07-17 ENCOUNTER — Encounter (INDEPENDENT_AMBULATORY_CARE_PROVIDER_SITE_OTHER): Payer: Medicare HMO

## 2010-07-17 DIAGNOSIS — K8020 Calculus of gallbladder without cholecystitis without obstruction: Secondary | ICD-10-CM | POA: Insufficient documentation

## 2010-07-17 DIAGNOSIS — E049 Nontoxic goiter, unspecified: Secondary | ICD-10-CM | POA: Insufficient documentation

## 2010-07-17 DIAGNOSIS — J984 Other disorders of lung: Secondary | ICD-10-CM

## 2010-07-17 DIAGNOSIS — J449 Chronic obstructive pulmonary disease, unspecified: Secondary | ICD-10-CM

## 2010-07-19 ENCOUNTER — Telehealth: Payer: Self-pay | Admitting: Internal Medicine

## 2010-07-25 NOTE — Progress Notes (Addendum)
Summary: Dr Jonny Ruiz to fu [place] issues  ---- Converted from flag ---- ---- 07/19/2010 11:02 AM, Kalman Shan MD wrote: Thanks a lot  ---- 07/18/2010 1:06 PM, Corwin Levins MD wrote: Ok will f/u on these .  thanks ------------------------------  Appended Document: Dr Jonny Ruiz to fu [place] issues thyroid u/s neg for change or suspicous nodule;  ok to follow

## 2010-07-25 NOTE — Assessment & Plan Note (Signed)
Summary: PFT//SH   Copy to:  Dr. Oliver Barre Primary Provider/Referring Provider:  Dr. Oliver Barre   History of Present Illness: this was a pft visit. pls give him opd fu to see me  Allergies: No Known Drug Allergies   Other Orders: Carbon Monoxide diffusing w/capacity (56213) Lung Volumes/Gas dilution or washout (08657) Spirometry (Pre & Post) (84696)  Appended Document: PFT//SH pt set for 07-31-10.

## 2010-07-30 ENCOUNTER — Telehealth: Payer: Self-pay | Admitting: Internal Medicine

## 2010-07-31 ENCOUNTER — Ambulatory Visit: Payer: Medicare HMO | Admitting: Internal Medicine

## 2010-08-06 NOTE — Progress Notes (Signed)
----   Converted from flag ---- ---- 07/29/2010 9:07 PM, Corwin Levins MD wrote: Richard Stanley to call pt - thyroid u/s neg for change or anything suspicious  Pt does have one large gallstone (2.5 cm - unusually large, only one stone though)     - robin to ask if pt had any abd pain   ---- 07/18/2010 1:07 PM, Corwin Levins MD wrote: f/u on large gallstone , and thyroid u/s results ------------------------------  Called pt informed of above information. The patient is not having any abdominal pain at this time. noted Corwin Levins MD  July 30, 2010 9:00 AM

## 2010-08-26 ENCOUNTER — Telehealth: Payer: Self-pay | Admitting: Internal Medicine

## 2010-09-05 NOTE — Progress Notes (Addendum)
Summary: needs fu  Phone Note Outgoing Call   Summary of Call: he no showed for ov 07/31/10 affter his pft . he has copd. needs to come in. pls arrange Initial call taken by: Kalman Shan MD,  August 26, 2010 10:21 PM

## 2010-09-05 NOTE — Progress Notes (Signed)
Summary: 07/03/10 patient cancelled ov  ---- Converted from flag ---- ---- 07/03/2010 11:56 AM, Carron Curie CMA wrote:   ---- 07/03/2010 11:32 AM, Lehman Prom wrote: Lorain Childes: Patient, Richard Stanley ([id number]), his wife called and cancelled spirometry and ov with MR.  Also, cancelled CT of gallbladder and thyroid. ------------------------------

## 2010-09-24 ENCOUNTER — Encounter: Payer: Self-pay | Admitting: Internal Medicine

## 2010-09-26 ENCOUNTER — Ambulatory Visit (INDEPENDENT_AMBULATORY_CARE_PROVIDER_SITE_OTHER): Payer: Medicare HMO | Admitting: Internal Medicine

## 2010-09-26 ENCOUNTER — Encounter: Payer: Self-pay | Admitting: Internal Medicine

## 2010-09-26 DIAGNOSIS — J984 Other disorders of lung: Secondary | ICD-10-CM

## 2010-09-26 DIAGNOSIS — D34 Benign neoplasm of thyroid gland: Secondary | ICD-10-CM

## 2010-09-26 DIAGNOSIS — K8070 Calculus of gallbladder and bile duct without cholecystitis without obstruction: Secondary | ICD-10-CM

## 2010-09-26 DIAGNOSIS — J45909 Unspecified asthma, uncomplicated: Secondary | ICD-10-CM | POA: Insufficient documentation

## 2010-09-26 MED ORDER — BECLOMETHASONE DIPROPIONATE 80 MCG/ACT IN AERS: 2.0000 | INHALATION_SPRAY | Freq: Two times a day (BID) | RESPIRATORY_TRACT | Status: DC

## 2010-09-26 NOTE — Assessment & Plan Note (Signed)
Noted on CT chest and [place] feb 2012. Dr Jonny Ruiz following

## 2010-09-26 NOTE — Progress Notes (Signed)
Subjective:    Patient ID: Richard Stanley, male    DOB: 11-Mar-1936, 75 y.o.   MRN: [id number]  HPI 75 year old male. Ex-38 pack smoker. Chronic, insidiious onset dyspnea on exertion. PResent for 2 years. Did not hvae dyspnea when they moved from [place] 4 years ago.  Dyspnea occurs when he walks "1/4 mile" using a stick. Dyspnea relieved by rest. Stable since onset. Diagnosd as COPD by Dr Jonny Ruiz per wife and per notes. PFT 07/25/2009 showed FEv1 1.8L/66%  with 11% BD response and DLCO 90%. On Symbicort Rx since 2009-2010. Also, concern for RLL nodule on CXR   OV 09/26/2010: Saw him as new consult for above in Jan 2012. Had repeat PFTs in FEb 2012 which show similar results (see overview) and are c/w ASTHMA (NOT COPD). Had CT chest Jan 2012, which did not reveal any nodule. However, there were incidental thyroid nodule an gall stone that Dr Jonny Ruiz confirmed he would follow. Currently not taking symbicort. Still with class 2 dyspnea and taking albuterol daily. Does not understand need for maintenance medications. Denies wheeze, chest pain, nausea, vomit.   Review of Systems Constitutional:   No  weight loss, night sweats,  Fevers, chills, fatigue, lassitude. HEENT:   No headaches,  Difficulty swallowing,  Tooth/dental problems,  Sore throat,                No sneezing, itching, ear ache, nasal congestion, post nasal drip,   CV:  No chest pain,  Orthopnea, PND, swelling in lower extremities, anasarca, dizziness, palpitations  GI  No heartburn, indigestion, abdominal pain, nausea, vomiting, diarrhea, change in bowel habits, loss of appetite  Resp: No excess mucus, no productive cough,  No non-productive cough,  No coughing up of blood.  No change in color of mucus.  No wheezing.  No chest wall deformity  Skin: no rash or lesions.  GU: no dysuria, change in color of urine, no urgency or frequency.  No flank pain.  MS:  No joint pain or swelling.  No decreased range of motion.  No back  pain.  Psych:  No change in mood or affect. No depression or anxiety.  No memory loss.      Objective:   Physical Exam  Nursing note and vitals reviewed. Constitutional: He is oriented to person, place, and time. He appears well-developed and well-nourished. No distress.  HENT:  Head: Normocephalic and atraumatic.  Right Ear: External ear normal.  Left Ear: External ear normal.  Mouth/Throat: Oropharynx is clear and moist. No oropharyngeal exudate.  Eyes: Conjunctivae and EOM are normal. Pupils are equal, round, and reactive to light. Right eye exhibits no discharge. Left eye exhibits no discharge. No scleral icterus.  Neck: Normal range of motion. Neck supple. No JVD present. No tracheal deviation present. No thyromegaly present.  Cardiovascular: Normal rate, regular rhythm and intact distal pulses.  Exam reveals no gallop and no friction rub.   No murmur heard. Pulmonary/Chest: Effort normal and breath sounds normal. No respiratory distress. He has no wheezes. He has no rales. He exhibits no tenderness.  Abdominal: Soft. Bowel sounds are normal. He exhibits no distension and no mass. There is no tenderness. There is no rebound and no guarding.  Musculoskeletal: Normal range of motion. He exhibits no edema and no tenderness.  Lymphadenopathy:    He has no cervical adenopathy.  Neurological: He is alert and oriented to person, place, and time. He has normal reflexes. No cranial nerve deficit. Coordination normal.  Skin: Skin is warm and dry. No rash noted. He is not diaphoretic. No erythema. No pallor.  Psychiatric: He has a normal mood and affect. His behavior is normal. Judgment and thought content normal.           Assessment & Plan:

## 2010-09-26 NOTE — Assessment & Plan Note (Signed)
PULMONARY NODULE, RIGHT LOWER LOBE concrne on CXR jan 2012 but none identified on CT chest Jan 2012. No further fu

## 2010-09-26 NOTE — Assessment & Plan Note (Signed)
#  Asthma and breathing We are changing diagnosis from COPD to MODERATE ASTHMA Stop symbicort You need to take QVAR 2 puff twice daily without fail; my nurse will show you technique and give you samples; compliance important Take albuterol only as needed Return in 3 months Have spirometry at fu and check alpha 1 at fu

## 2010-09-26 NOTE — Assessment & Plan Note (Signed)
Noted on CT chest and [place] feb 2012. Dr. Jonny Ruiz in email communique agreed to fu this. Patient and wife state no discussion of this from Dr. Jonny Ruiz. Will alert Dr. Jonny Ruiz again becusse he has some tiredness

## 2010-09-26 NOTE — Patient Instructions (Signed)
#  Asthma and breathing We are changing diagnosis from COPD to MODERATE ASTHMA You need to take QVAR 2 puff twice daily without fail; my nurse will show you technique and give you samples Take albuterol only as needed Return in 3 months Have spirometry at fu and check alpha 1 at fu #lung nodule  - none detected. No further fu for this issue #thyroid Talk to Dr. Oliver Barre #followup - 3 months

## 2010-09-29 ENCOUNTER — Encounter: Payer: Self-pay | Admitting: Internal Medicine

## 2010-09-29 DIAGNOSIS — Z0000 Encounter for general adult medical examination without abnormal findings: Secondary | ICD-10-CM | POA: Insufficient documentation

## 2010-10-01 ENCOUNTER — Ambulatory Visit (INDEPENDENT_AMBULATORY_CARE_PROVIDER_SITE_OTHER): Payer: Medicare HMO | Admitting: Internal Medicine

## 2010-10-01 ENCOUNTER — Encounter: Payer: Self-pay | Admitting: Internal Medicine

## 2010-10-01 VITALS — BP 142/80 | HR 59 | Temp 97.5°F | Ht 67.0 in | Wt 197.4 lb

## 2010-10-01 DIAGNOSIS — E041 Nontoxic single thyroid nodule: Secondary | ICD-10-CM

## 2010-10-01 DIAGNOSIS — R21 Rash and other nonspecific skin eruption: Secondary | ICD-10-CM

## 2010-10-01 DIAGNOSIS — E785 Hyperlipidemia, unspecified: Secondary | ICD-10-CM

## 2010-10-01 DIAGNOSIS — F329 Major depressive disorder, single episode, unspecified: Secondary | ICD-10-CM

## 2010-10-01 HISTORY — DX: Nontoxic single thyroid nodule: E04.1

## 2010-10-01 HISTORY — DX: Rash and other nonspecific skin eruption: R21

## 2010-10-01 MED ORDER — SIMVASTATIN 40 MG PO TABS: 40.0000 mg | ORAL_TABLET | Freq: Every day | ORAL | Status: DC

## 2010-10-01 MED ORDER — TRIAMCINOLONE ACETONIDE 0.1 % EX CREA: TOPICAL_CREAM | Freq: Two times a day (BID) | CUTANEOUS | Status: AC

## 2010-10-01 NOTE — Patient Instructions (Signed)
No further need to evaluate the thyroid at this time Take all new medications as prescribed Continue all other medications as before, including re-starting the simvastatin Please call in one wk if the rash on the arm is not improved

## 2010-10-06 ENCOUNTER — Encounter: Payer: Self-pay | Admitting: Internal Medicine

## 2010-10-06 NOTE — Progress Notes (Signed)
Subjective:    Patient ID: Richard Stanley, male    DOB: [birth year], 75 y.o.   MRN: [id number]  HPI  Here with wife, who is insistent on discussion of "thyroid issue"'  Recent thyroid u/s neg for signficant nodule and last TSH normal,  Denies hyper or hypo thyroid symptoms such as voice, skin or hair change.  Is out of zocor for the past wk and not taking, did not call for refill .  Not taking the celexa , and Denies worsening depressive symptoms, suicidal ideation, or panic, though has ongoing anxiety, not increased recently per pt.  Does have sense of ongoing fatigue, but denies signficant hypersomnolence.  Does have a small rash to mid right arm with itch and has been scratching, slowly increased in size and itching, scally, not painful, fever , drainage, or red streaks.  Pt denies chest pain, increased sob or doe, wheezing, orthopnea, PND, increased LE swelling, palpitations, dizziness or syncope.  Pt denies new neurological symptoms such as new headache, or facial or extremity weakness or numbness   Pt denies polydipsia, polyuria.  No other complaints today Past Medical History  Diagnosis Date  . COPD (chronic obstructive pulmonary disease)   . Nodule of right lung   . Hyperlipidemia   . ED (erectile dysfunction)   . Depression   . Anxiety   . ANXIETY 05/13/2010  . Asthma 09/26/2010  . DEPRESSION 06/22/2009  . HYPERLIPIDEMIA 06/18/2009  . 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 Other diseases of lung, not elsewhere classified 06/18/2009  . Thyroid nodule 10/01/2010  . Rash 10/01/2010   Past Surgical History  Procedure Date  . Cardiac catheterization 1998    normal per pt    reports that he quit smoking about 21 years ago. His smoking use included Cigarettes. He has a 38 pack-year smoking history. He does not have any smokeless tobacco history on file. He reports that he does not  drink alcohol or use illicit drugs. family history includes Cancer in his father; Colon cancer in his father; and Other in his sister. No Known Allergies Current Outpatient Prescriptions on File Prior to Visit  Medication Sig Dispense Refill  . albuterol (PROAIR HFA) 108 (90 BASE) MCG/ACT inhaler Inhale 2 puffs into the lungs every 6 (six) hours as needed.        Marland Kitchen aspirin 81 MG tablet Take 81 mg by mouth daily.        . beclomethasone (QVAR) 80 MCG/ACT inhaler Inhale 2 puffs into the lungs 2 (two) times daily.  1 Inhaler  3  . citalopram (CELEXA) 10 MG tablet Take 10 mg by mouth daily.        . clonazePAM (KLONOPIN) 0.5 MG tablet Take 0.5 mg by mouth 2 (two) times daily as needed.         Review of Systems All otherwise neg per pt     Objective:   Physical Exam BP 142/80  Pulse 59  Temp(Src) 97.5 F (36.4 C) (Oral)  Ht 5\' 7"  (1.702 m)  Wt 197 lb 6 oz (89.529 kg)  BMI 30.91 kg/m2  SpO2 94% Physical Exam  VS noted Constitutional: Pt appears well-developed and well-nourished.  HENT: Head: Normocephalic.  Right Ear: External ear normal.  Left Ear: External ear normal.  Eyes: Conjunctivae and EOM are normal. Pupils are equal, round, and reactive to light.  Neck: Normal range of motion.  Neck supple.  Cardiovascular: Normal rate and regular rhythm.   Pulmonary/Chest: Effort normal and breath sounds normal.  Abd:  Soft, NT, non-distended, + BS Neurological: Pt is alert. No cranial nerve deficit.  Skin: Skin is warm. No erythema. appro 1.5 cm rash/lesion to right arm scaly with somewhat heaped edges, atypical location for basal cell, nontender, no red streaks, swelling Psychiatric: Pt behavior is normal. Thought content normal. 1-2+ nervous, somewhat dysphoric        Assessment & Plan:

## 2010-10-06 NOTE — Assessment & Plan Note (Signed)
Thyroid u/s results not clincially significant, no further eval or tx needed; pt and wife reassured

## 2010-10-06 NOTE — Assessment & Plan Note (Addendum)
stable overall by hx and exam, most recent lab reviewed with pt, and pt to continue medical treatment as before  Lab Results  Component Value Date   LDLCALC 61 07/19/2009    To cont diet, re-start the statin

## 2010-10-06 NOTE — Assessment & Plan Note (Signed)
stable overall by hx and exam, most recent lab reviewed with pt, and pt to continue medical treatment as before - now off the celexa

## 2010-10-06 NOTE — Assessment & Plan Note (Signed)
Atypical , cant r/o skin ca, but will try triam cr first, consider derm if not improved in less than 1 wk

## 2010-10-31 ENCOUNTER — Encounter: Payer: Self-pay | Admitting: Endocrinology

## 2010-10-31 ENCOUNTER — Ambulatory Visit (INDEPENDENT_AMBULATORY_CARE_PROVIDER_SITE_OTHER): Payer: Medicare HMO | Admitting: Endocrinology

## 2010-10-31 ENCOUNTER — Other Ambulatory Visit (INDEPENDENT_AMBULATORY_CARE_PROVIDER_SITE_OTHER): Payer: Medicare HMO

## 2010-10-31 DIAGNOSIS — M791 Myalgia, unspecified site: Secondary | ICD-10-CM | POA: Insufficient documentation

## 2010-10-31 DIAGNOSIS — R110 Nausea: Secondary | ICD-10-CM | POA: Insufficient documentation

## 2010-10-31 DIAGNOSIS — IMO0001 Reserved for inherently not codable concepts without codable children: Secondary | ICD-10-CM

## 2010-10-31 LAB — CBC WITH DIFFERENTIAL/PLATELET
Basophils Relative: 0.1 % (ref 0.0–3.0)
Eosinophils Absolute: 0 10*3/uL (ref 0.0–0.7)
MCHC: 34.2 g/dL (ref 30.0–36.0)
MCV: 89.2 fl (ref 78.0–100.0)
Monocytes Absolute: 0.5 10*3/uL (ref 0.1–1.0)
Neutrophils Relative %: 86 % — ABNORMAL HIGH (ref 43.0–77.0)
Platelets: 165 10*3/uL (ref 150.0–400.0)

## 2010-10-31 MED ORDER — ONDANSETRON HCL 4 MG PO TABS: 4.0000 mg | ORAL_TABLET | Freq: Three times a day (TID) | ORAL | Status: AC | PRN

## 2010-10-31 MED ORDER — ONDANSETRON HCL 4 MG PO TABS: 4.0000 mg | ORAL_TABLET | Freq: Three times a day (TID) | ORAL | Status: DC | PRN

## 2010-10-31 NOTE — Patient Instructions (Addendum)
i have sent a prescription to your pharmacy for nausea. blood tests are being ordered for you today.  please call [phone] to hear your test results.  You will be prompted to enter the 9-digit "MRN" number that appears at the top left of this page, followed by #.  Then you will hear the message. I hope you feel better soon.  If you don't feel better by next week, please call your doctor. Also, take tylenol, drink plenty of fluids, and rest until you feel better. (update: i left message on phone-tree:  rx as we discussed)

## 2010-10-31 NOTE — Progress Notes (Signed)
Subjective:    Patient ID: Richard Stanley, male    DOB: 1935/10/23, 74 y.o.   MRN: [id number]  HPI 1 day of diffuse myalgias, and assoc decreased appetite.  He has "feeling hot and cold."  He has nausea, but no vomiting. Past Medical History  Diagnosis Date  . COPD (chronic obstructive pulmonary disease)   . Nodule of right lung   . Hyperlipidemia   . ED (erectile dysfunction)   . Depression   . Anxiety   . ANXIETY 05/13/2010  . Asthma 09/26/2010  . DEPRESSION 06/22/2009  . HYPERLIPIDEMIA 06/18/2009  . 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 Other diseases of lung, not elsewhere classified 06/18/2009  . Thyroid nodule 10/01/2010  . Rash 10/01/2010    Past Surgical History  Procedure Date  . Cardiac catheterization 1998    normal per pt    History   Social History  . Marital Status: Married    Spouse Name: N/A    Number of Children: 2  . Years of Education: N/A   Occupational History  . retired from [employer]    Social History Main Topics  . Smoking status: Former Smoker -- 1.0 packs/day for 38 years    Types: Cigarettes    Quit date: 06/09/1989  . Smokeless tobacco: Not on file   Comment: Also smoke pipe in 1969 to 1991, several times a day  . Alcohol Use: No  . Drug Use: No  . Sexually Active: Not on file   Other Topics Concern  . Not on file   Social History Narrative  . No narrative on file    Current Outpatient Prescriptions on File Prior to Visit  Medication Sig Dispense Refill  . albuterol (PROAIR HFA) 108 (90 BASE) MCG/ACT inhaler Inhale 2 puffs into the lungs every 6 (six) hours as needed.        Marland Kitchen aspirin 81 MG tablet Take 81 mg by mouth daily.        . clonazePAM (KLONOPIN) 0.5 MG tablet Take 0.5 mg by mouth 2 (two) times daily as needed.        . simvastatin (ZOCOR) 40 MG tablet Take 1 tablet (40 mg total) by mouth at  bedtime.  90 tablet  3  . triamcinolone (KENALOG) 0.1 % cream Apply topically 2 (two) times daily.  30 g  0  . beclomethasone (QVAR) 80 MCG/ACT inhaler Inhale 2 puffs into the lungs 2 (two) times daily.  1 Inhaler  3    No Known Allergies  Family History  Problem Relation Age of Onset  . Colon cancer Father   . Cancer Father     colon  . Other Sister     died after knee surgery    BP 132/80  Pulse 89  Temp(Src) 98.3 F (36.8 C) (Oral)  Ht 5' 7.5" (1.715 m)  Wt 194 lb 12.8 oz (88.361 kg)  BMI 30.06 kg/m2  SpO2 92%    Review of Systems Denies loc and sob.    Objective:   Physical Exam GENERAL: no distress head: no deformity eyes: no periorbital swelling, no proptosis external nose and ears are normal mouth: no lesion seen Both eac's and tm's are normal LUNGS:  Clear to auscultation ABDOMEN: abdomen is soft, nontender.  no hepatosplenomegaly.   not distended.  no hernia    Lab Results  Component Value Date  WBC 7.7 10/31/2010   HGB 16.3 10/31/2010   HCT 47.5 10/31/2010   PLT 165.0 10/31/2010   CHOL 139 07/19/2009   TRIG 172.0* 07/19/2009   HDL 43.80 07/19/2009   LDLDIRECT 159.8 06/18/2009   ALT 17 10/31/2010   AST 23 10/31/2010   NA 138 10/31/2010   K 4.1 10/31/2010   CL 102 10/31/2010   CREATININE 1.5 10/31/2010   BUN 18 10/31/2010   CO2 27 10/31/2010   TSH 2.32 05/13/2010   PSA 1.11 06/18/2009   MICROALBUR 1.0 06/18/2009      Assessment & Plan:  Viral illness, new

## 2010-11-01 LAB — HEPATIC FUNCTION PANEL
ALT: 17 U/L (ref 0–53)
Total Bilirubin: 0.6 mg/dL (ref 0.3–1.2)

## 2010-11-01 LAB — BASIC METABOLIC PANEL
BUN: 18 mg/dL (ref 6–23)
CO2: 27 mEq/L (ref 19–32)
Chloride: 102 mEq/L (ref 96–112)
Creatinine, Ser: 1.5 mg/dL (ref 0.4–1.5)
Glucose, Bld: 95 mg/dL (ref 70–99)

## 2010-11-12 ENCOUNTER — Encounter: Payer: Self-pay | Admitting: Internal Medicine

## 2011-05-15 ENCOUNTER — Other Ambulatory Visit (INDEPENDENT_AMBULATORY_CARE_PROVIDER_SITE_OTHER): Payer: Medicare HMO

## 2011-05-15 ENCOUNTER — Encounter: Payer: Self-pay | Admitting: Endocrinology

## 2011-05-15 ENCOUNTER — Ambulatory Visit (INDEPENDENT_AMBULATORY_CARE_PROVIDER_SITE_OTHER): Payer: Medicare HMO | Admitting: Endocrinology

## 2011-05-15 VITALS — BP 124/68 | HR 83 | Temp 98.5°F | Ht 67.0 in | Wt 195.6 lb

## 2011-05-15 DIAGNOSIS — R42 Dizziness and giddiness: Secondary | ICD-10-CM

## 2011-05-15 DIAGNOSIS — I4891 Unspecified atrial fibrillation: Secondary | ICD-10-CM

## 2011-05-15 LAB — CBC WITH DIFFERENTIAL/PLATELET
Basophils Absolute: 0 10*3/uL (ref 0.0–0.1)
Basophils Relative: 0.5 % (ref 0.0–3.0)
Eosinophils Absolute: 0.2 10*3/uL (ref 0.0–0.7)
HCT: 49.8 % (ref 39.0–52.0)
Hemoglobin: 16.6 g/dL (ref 13.0–17.0)
Lymphocytes Relative: 16.6 % (ref 12.0–46.0)
Lymphs Abs: 1.5 10*3/uL (ref 0.7–4.0)
MCHC: 33.3 g/dL (ref 30.0–36.0)
MCV: 89.3 fl (ref 78.0–100.0)
Monocytes Absolute: 0.6 10*3/uL (ref 0.1–1.0)
Neutro Abs: 6.8 10*3/uL (ref 1.4–7.7)
RBC: 5.58 Mil/uL (ref 4.22–5.81)
RDW: 13 % (ref 11.5–14.6)

## 2011-05-15 NOTE — Patient Instructions (Addendum)
Refer to a heart specialist.  you will receive a phone call, about a day and time for an appointment. blood tests are being requested for you today.  please call [phone] to hear your test results.  You will be prompted to enter the 9-digit "MRN" number that appears at the top left of this page, followed by #.  Then you will hear the message. it is critically important to prevent falling down (keep floor areas well-lit, dry, and free of loose objects.  If you have a cane, walker, or wheelchair, you should use it, even for short trips around the house.  Also, try not to rush) (update: i left message on phone-tree:  Please have creat rechecked by dr Jonny Ruiz, next time you see him)

## 2011-05-15 NOTE — Progress Notes (Signed)
Subjective:    Patient ID: Richard Stanley, male    DOB: 04-12-1936, 75 y.o.   MRN: [id number]  HPI Pt states 1 week of intermittent mild to mod dizziness, worse in the context of upright posture.  He has 1 day of assoc diarrhea.   Past Medical History  Diagnosis Date  . COPD (chronic obstructive pulmonary disease)   . Nodule of right lung   . Hyperlipidemia   . ED (erectile dysfunction)   . Depression   . Anxiety   . ANXIETY 05/13/2010  . Asthma 09/26/2010  . DEPRESSION 06/22/2009  . HYPERLIPIDEMIA 06/18/2009  . 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 Other diseases of lung, not elsewhere classified 06/18/2009  . Thyroid nodule 10/01/2010  . Rash 10/01/2010    Past Surgical History  Procedure Date  . Cardiac catheterization 1998    normal per pt    History   Social History  . Marital Status: Married    Spouse Name: N/A    Number of Children: 2  . Years of Education: N/A   Occupational History  . retired from [employer]    Social History Main Topics  . Smoking status: Former Smoker -- 1.0 packs/day for 38 years    Types: Cigarettes    Quit date: 06/09/1989  . Smokeless tobacco: Not on file   Comment: Also smoke pipe in 1969 to 1991, several times a day  . Alcohol Use: No  . Drug Use: No  . Sexually Active: Not on file   Other Topics Concern  . Not on file   Social History Narrative  . No narrative on file    Current Outpatient Prescriptions on File Prior to Visit  Medication Sig Dispense Refill  . albuterol (PROAIR HFA) 108 (90 BASE) MCG/ACT inhaler Inhale 2 puffs into the lungs every 6 (six) hours as needed.        Marland Kitchen aspirin 81 MG tablet Take 81 mg by mouth daily.        . clonazePAM (KLONOPIN) 0.5 MG tablet Take 0.5 mg by mouth 2 (two) times daily as needed.        . simvastatin (ZOCOR) 40 MG tablet Take 1 tablet (40 mg total) by  mouth at bedtime.  90 tablet  3  . triamcinolone (KENALOG) 0.1 % cream Apply topically 2 (two) times daily.  30 g  0  . beclomethasone (QVAR) 80 MCG/ACT inhaler Inhale 2 puffs into the lungs 2 (two) times daily.  1 Inhaler  3    No Known Allergies  Family History  Problem Relation Age of Onset  . Colon cancer Father   . Cancer Father     colon  . Other Sister     died after knee surgery    BP 124/68  Pulse 83  Temp(Src) 98.5 F (36.9 C) (Oral)  Ht 5\' 7"  (1.702 m)  Wt 195 lb 9.6 oz (88.724 kg)  BMI 30.64 kg/m2  SpO2 95%    Review of Systems Denies loc/fever/headache/n/v/vis loss/earache/brbpr/hematuria/chest pain    Objective:   Physical Exam bp 120/80 supine, and 110/80 sitting VITAL SIGNS:  See vs page GENERAL: no distress head: no deformity eyes: no periorbital swelling, no proptosis external nose and ears are normal mouth: no lesion seen Both eac's and tm's are normal LUNGS:  Clear to auscultation HEART:  Regular rate and rhythm without murmurs noted.  Normal S1,S2.   Gait; slow but steady, with a cane   i reviewed electrocardiogram Lab Results  Component Value Date   WBC 9.2 05/15/2011   HGB 16.6 05/15/2011   HCT 49.8 05/15/2011   PLT 209.0 05/15/2011   GLUCOSE 116* 05/15/2011   CHOL 139 07/19/2009   TRIG 172.0* 07/19/2009   HDL 43.80 07/19/2009   LDLDIRECT 159.8 06/18/2009   LDLCALC 61 07/19/2009   ALT 17 10/31/2010   AST 23 10/31/2010   NA 142 05/15/2011   K 4.1 05/15/2011   CL 104 05/15/2011   CREATININE 1.6* 05/15/2011   BUN 24* 05/15/2011   CO2 32 05/15/2011   TSH 2.67 05/15/2011   PSA 1.11 06/18/2009   MICROALBUR 1.0 06/18/2009      Assessment & Plan:  AF, apparently new Renal insuff, slightly worse Gait difficulty.  He may not be a coumadin candidate Diarrhea, usually viral

## 2011-05-16 LAB — BASIC METABOLIC PANEL
BUN: 24 mg/dL — ABNORMAL HIGH (ref 6–23)
Calcium: 9.1 mg/dL (ref 8.4–10.5)
Creatinine, Ser: 1.6 mg/dL — ABNORMAL HIGH (ref 0.4–1.5)
GFR: 44.54 mL/min — ABNORMAL LOW (ref >60.00)

## 2011-06-13 ENCOUNTER — Ambulatory Visit: Payer: Medicare HMO | Admitting: Cardiology

## 2011-07-21 ENCOUNTER — Other Ambulatory Visit: Payer: Self-pay | Admitting: Internal Medicine

## 2011-08-27 ENCOUNTER — Other Ambulatory Visit (INDEPENDENT_AMBULATORY_CARE_PROVIDER_SITE_OTHER): Payer: Medicare Other

## 2011-08-27 ENCOUNTER — Encounter: Payer: Self-pay | Admitting: Internal Medicine

## 2011-08-27 ENCOUNTER — Ambulatory Visit (INDEPENDENT_AMBULATORY_CARE_PROVIDER_SITE_OTHER)
Admission: RE | Admit: 2011-08-27 | Discharge: 2011-08-27 | Disposition: A | Discharge status: Home / Self Care | Payer: Medicare Other | Source: Ambulatory Visit | Attending: Internal Medicine | Admitting: Internal Medicine

## 2011-08-27 ENCOUNTER — Ambulatory Visit (INDEPENDENT_AMBULATORY_CARE_PROVIDER_SITE_OTHER): Payer: Medicare Other | Admitting: Internal Medicine

## 2011-08-27 VITALS — BP 122/80 | HR 61 | Temp 97.8°F | Ht 68.0 in | Wt 194.5 lb

## 2011-08-27 DIAGNOSIS — J45909 Unspecified asthma, uncomplicated: Secondary | ICD-10-CM

## 2011-08-27 DIAGNOSIS — R0989 Other specified symptoms and signs involving the circulatory and respiratory systems: Secondary | ICD-10-CM

## 2011-08-27 DIAGNOSIS — R5381 Other malaise: Secondary | ICD-10-CM

## 2011-08-27 DIAGNOSIS — R0600 Dyspnea, unspecified: Secondary | ICD-10-CM | POA: Insufficient documentation

## 2011-08-27 DIAGNOSIS — N189 Chronic kidney disease, unspecified: Secondary | ICD-10-CM

## 2011-08-27 DIAGNOSIS — Z0000 Encounter for general adult medical examination without abnormal findings: Secondary | ICD-10-CM

## 2011-08-27 DIAGNOSIS — I4891 Unspecified atrial fibrillation: Secondary | ICD-10-CM

## 2011-08-27 DIAGNOSIS — R5383 Other fatigue: Secondary | ICD-10-CM

## 2011-08-27 DIAGNOSIS — N183 Chronic kidney disease, stage 3 unspecified: Secondary | ICD-10-CM | POA: Insufficient documentation

## 2011-08-27 LAB — CBC WITH DIFFERENTIAL/PLATELET
Basophils Absolute: 0.1 10*3/uL (ref 0.0–0.1)
Basophils Relative: 0.6 % (ref 0.0–3.0)
Eosinophils Relative: 2.6 % (ref 0.0–5.0)
Hemoglobin: 16.2 g/dL (ref 13.0–17.0)
Lymphocytes Relative: 13.9 % (ref 12.0–46.0)
Monocytes Relative: 6.7 % (ref 3.0–12.0)
Neutro Abs: 6.9 10*3/uL (ref 1.4–7.7)
RBC: 5.46 Mil/uL (ref 4.22–5.81)
RDW: 13.1 % (ref 11.5–14.6)
WBC: 9 10*3/uL (ref 4.5–10.5)

## 2011-08-27 LAB — BASIC METABOLIC PANEL
CO2: 29 mEq/L (ref 19–32)
Calcium: 9.4 mg/dL (ref 8.4–10.5)
Creatinine, Ser: 1.5 mg/dL (ref 0.4–1.5)
Glucose, Bld: 106 mg/dL — ABNORMAL HIGH (ref 70–99)

## 2011-08-27 LAB — HEPATIC FUNCTION PANEL
Albumin: 4.2 g/dL (ref 3.5–5.2)
Total Protein: 7.6 g/dL (ref 6.0–8.3)

## 2011-08-27 MED ORDER — BUDESONIDE-FORMOTEROL FUMARATE 160-4.5 MCG/ACT IN AERO: 2.0000 | INHALATION_SPRAY | Freq: Two times a day (BID) | RESPIRATORY_TRACT | Status: AC

## 2011-08-27 NOTE — Assessment & Plan Note (Signed)
?   PAF, ECG reviewed as per emr - sinus today, for echo, cont asa for now, to cardiology

## 2011-08-27 NOTE — Patient Instructions (Signed)
Your EKG shows normal rhythm today Take all new medications as prescribed - the symbicort Please go to XRAY in the Basement for the x-ray test Please go to LAB in the Basement for the blood and/or urine tests to be done today You will be contacted regarding the referral for: echocardiogram, and cardiology, and abd ultrasound You will be contacted by phone if any changes need to be made immediately.  Otherwise, you will receive a letter about your results with an explanation.

## 2011-08-27 NOTE — Assessment & Plan Note (Signed)
To try symbicort again for mild uncontroled symptoms

## 2011-08-31 ENCOUNTER — Encounter: Payer: Self-pay | Admitting: Internal Medicine

## 2011-08-31 NOTE — Assessment & Plan Note (Signed)
Unclear etiology, for cxr,  to f/u any worsening symptoms or concerns

## 2011-08-31 NOTE — Progress Notes (Signed)
Subjective:    Patient ID: Richard Stanley, male    DOB: 1935/10/24, 76 y.o.   MRN: [id number]  HPI  Here for f/u;  C/o sob and wheezing, not using the qvar, worse at night, did not do well previously with symbicort but willing to try again;  Never did f/u with card as well despite new documented afib;  Pt denies chest pain, increased  orthopnea, PND, increased LE swelling, palpitations, dizziness or syncope.  Pt denies new neurological symptoms such as new headache, or facial or extremity weakness or numbness   Pt denies polydipsia, polyuria.  Does have sense of ongoing fatigue, but denies signficant hypersomnolence.  Also mentions left flank area pain, recurrent for several months, intermittent mild without change in severity, bowel or bladder change, fever, wt loss,  worsening LE pain/numbness/weakness, gait change or falls. Worried b/c a friend had renal cell ca with the same area of pain.  Denies worsening depressive symptoms, suicidal ideation, or panic Past Medical History  Diagnosis Date  . COPD (chronic obstructive pulmonary disease)   . Nodule of right lung   . Hyperlipidemia   . ED (erectile dysfunction)   . Depression   . Anxiety   . ANXIETY 05/13/2010  . Asthma 09/26/2010  . DEPRESSION 06/22/2009  . HYPERLIPIDEMIA 06/18/2009  . 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 Other diseases of lung, not elsewhere classified 06/18/2009  . Thyroid nodule 10/01/2010  . Rash 10/01/2010   Past Surgical History  Procedure Date  . Cardiac catheterization 1998    normal per pt    reports that he quit smoking about 22 years ago. His smoking use included Cigarettes. He has a 38 pack-year smoking history. He does not have any smokeless tobacco history on file. He reports that he does not drink alcohol or use illicit drugs. family history includes Cancer in his father; Colon cancer in his  father; and Other in his sister. No Known Allergies Current Outpatient Prescriptions on File Prior to Visit  Medication Sig Dispense Refill  . albuterol (PROAIR HFA) 108 (90 BASE) MCG/ACT inhaler Inhale 2 puffs into the lungs every 6 (six) hours as needed.        Marland Kitchen aspirin 81 MG tablet Take 81 mg by mouth daily.        . citalopram (CELEXA) 10 MG tablet TAKE ONE TABLET BY MOUTH EVERY DAY  90 tablet  0  . clonazePAM (KLONOPIN) 0.5 MG tablet Take 0.5 mg by mouth 2 (two) times daily as needed.        . simvastatin (ZOCOR) 40 MG tablet Take 1 tablet (40 mg total) by mouth at bedtime.  90 tablet  3  . triamcinolone (KENALOG) 0.1 % cream Apply topically 2 (two) times daily.  30 g  0  . beclomethasone (QVAR) 80 MCG/ACT inhaler Inhale 2 puffs into the lungs 2 (two) times daily.  1 Inhaler  3   Review of Systems Review of Systems  Constitutional: Negative for diaphoresis and unexpected weight change.  HENT: Negative for drooling and tinnitus.   Eyes: Negative for photophobia and visual disturbance.  Respiratory: Negative for choking and stridor.   Gastrointestinal: Negative for vomiting and blood in stool.  Genitourinary: Negative for hematuria and decreased urine volume.  Musculoskeletal: Negative for gait problem.  Skin: Negative for color change and wound.    Objective:   Physical Exam BP 122/80  Pulse 61  Temp(Src) 97.8 F (36.6 C) (Oral)  Ht 5\' 8"  (1.727 m)  Wt 194 lb 8 oz (88.225 kg)  BMI 29.57 kg/m2  SpO2 90% Physical Exam  VS noted,not il appaering Constitutional: Pt appears well-developed and well-nourished.  HENT: Head: Normocephalic.  Right Ear: External ear normal.  Left Ear: External ear normal.  Eyes: Conjunctivae and EOM are normal. Pupils are equal, round, and reactive to light.  Neck: Normal range of motion. Neck supple.  Cardiovascular: Normal rate and regular rhythm.   Pulmonary/Chest: Effort normal and breath sounds mild decreased, no wheeze just after albut  use Abd:  Soft, NT, non-distended, + BS Spine nontender Mild left flank tender Neurological: Pt is alert. No cranial nerve deficit.  Skin: Skin is warm. No erythema.  Psychiatric: Pt behavior is normal. Thought content normal.     Assessment & Plan:

## 2011-08-31 NOTE — Assessment & Plan Note (Addendum)
Etiology unclear, Exam otherwise benign, to check labs as documented, follow with expectant management  Note:  Time for pt hx, exam, review of record with pt in room including the jan 2013 ecg; determination of diagnosis with plan for further eval and tx is > 40 min

## 2011-08-31 NOTE — Assessment & Plan Note (Signed)
Mild worse, for abd u/s with attention to renal,  to f/u any worsening symptoms or concerns

## 2011-09-01 ENCOUNTER — Encounter: Payer: Self-pay | Admitting: Internal Medicine

## 2011-09-01 ENCOUNTER — Ambulatory Visit
Admission: RE | Admit: 2011-09-01 | Discharge: 2011-09-01 | Disposition: A | Discharge status: Home / Self Care | Payer: Medicare Other | Source: Ambulatory Visit | Attending: Internal Medicine | Admitting: Internal Medicine

## 2011-09-01 DIAGNOSIS — N189 Chronic kidney disease, unspecified: Secondary | ICD-10-CM

## 2011-09-08 ENCOUNTER — Ambulatory Visit (HOSPITAL_COMMUNITY): Payer: Medicare Other

## 2011-11-08 ENCOUNTER — Other Ambulatory Visit: Payer: Self-pay | Admitting: Internal Medicine

## 2011-11-13 ENCOUNTER — Emergency Department (HOSPITAL_COMMUNITY): Payer: Medicare Other

## 2011-11-13 ENCOUNTER — Telehealth: Payer: Self-pay | Admitting: Internal Medicine

## 2011-11-13 ENCOUNTER — Encounter (HOSPITAL_COMMUNITY): Payer: Self-pay | Admitting: Emergency Medicine

## 2011-11-13 ENCOUNTER — Emergency Department (HOSPITAL_COMMUNITY)
Admission: EM | Admit: 2011-11-13 | Discharge: 2011-11-13 | Disposition: A | Discharge status: Home / Self Care | Payer: Medicare Other | Attending: Emergency Medicine | Admitting: Emergency Medicine

## 2011-11-13 DIAGNOSIS — E785 Hyperlipidemia, unspecified: Secondary | ICD-10-CM | POA: Insufficient documentation

## 2011-11-13 DIAGNOSIS — J4489 Other specified chronic obstructive pulmonary disease: Secondary | ICD-10-CM | POA: Insufficient documentation

## 2011-11-13 DIAGNOSIS — J069 Acute upper respiratory infection, unspecified: Secondary | ICD-10-CM | POA: Insufficient documentation

## 2011-11-13 DIAGNOSIS — J449 Chronic obstructive pulmonary disease, unspecified: Secondary | ICD-10-CM | POA: Insufficient documentation

## 2011-11-13 DIAGNOSIS — F3289 Other specified depressive episodes: Secondary | ICD-10-CM | POA: Insufficient documentation

## 2011-11-13 DIAGNOSIS — R0982 Postnasal drip: Secondary | ICD-10-CM

## 2011-11-13 DIAGNOSIS — Z87891 Personal history of nicotine dependence: Secondary | ICD-10-CM | POA: Insufficient documentation

## 2011-11-13 DIAGNOSIS — F329 Major depressive disorder, single episode, unspecified: Secondary | ICD-10-CM | POA: Insufficient documentation

## 2011-11-13 DIAGNOSIS — F411 Generalized anxiety disorder: Secondary | ICD-10-CM | POA: Insufficient documentation

## 2011-11-13 MED ORDER — AZITHROMYCIN 250 MG PO TABS
500.0000 mg | ORAL_TABLET | Freq: Once | ORAL | Status: AC
  Administered 2011-11-13: 500 mg via ORAL
  Filled 2011-11-13: qty 2

## 2011-11-13 MED ORDER — DEXTROMETHORPHAN-GUAIFENESIN 10-200 MG PO CAPS: 1.0000 | ORAL_CAPSULE | Freq: Every day | ORAL | Status: DC

## 2011-11-13 MED ORDER — AZITHROMYCIN 250 MG PO TABS: 250.0000 mg | ORAL_TABLET | Freq: Every day | ORAL | Status: DC

## 2011-11-13 NOTE — ED Provider Notes (Signed)
History     CSN: [id number]  Arrival date & time 11/13/11  1950   First MD Initiated Contact with Patient 11/13/11 2144      Chief Complaint  Patient presents with  . Cough    (Consider location/radiation/quality/duration/timing/severity/associated sxs/prior treatment) HPI Comments: Patient has had a persistent cough.  All day, worse when he lies down.  He does have new onset rhinorrhea.  He has family member did call Dr. Jonny Ruiz who recommended that he come to the emergency department. He has a history of "asthma"  Patient is a 76 y.o. male presenting with cough. The history is provided by the patient.  Cough This is a new problem. The current episode started 12 to 24 hours ago. The problem occurs every few minutes. The problem has not changed since onset.The cough is non-productive. Associated symptoms include rhinorrhea. Pertinent negatives include no chest pain, no chills, no ear congestion, no ear pain, no headaches, no sore throat, no shortness of breath and no wheezing.    Past Medical History  Diagnosis Date  . COPD (chronic obstructive pulmonary disease)   . Nodule of right lung   . Hyperlipidemia   . ED (erectile dysfunction)   . Depression   . Anxiety   . ANXIETY 05/13/2010  . Asthma 09/26/2010  . DEPRESSION 06/22/2009  . HYPERLIPIDEMIA 06/18/2009  . 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 Other diseases of lung, not elsewhere classified 06/18/2009  . Thyroid nodule 10/01/2010  . Rash 10/01/2010    Past Surgical History  Procedure Date  . Cardiac catheterization 1998    normal per pt    Family History  Problem Relation Age of Onset  . Colon cancer Father   . Cancer Father     colon  . Other Sister     died after knee surgery    History  Substance Use Topics  . Smoking status: Former Smoker -- 1.0 packs/day for 38 years    Types: Cigarettes   Quit date: 06/09/1989  . Smokeless tobacco: Not on file   Comment: Also smoke pipe in 1969 to 1991, several times a day  . Alcohol Use: No      Review of Systems  Constitutional: Negative for fever and chills.  HENT: Positive for rhinorrhea and postnasal drip. Negative for ear pain, congestion and sore throat.   Respiratory: Positive for cough. Negative for chest tightness, shortness of breath and wheezing.   Cardiovascular: Negative for chest pain.  Gastrointestinal: Negative for nausea.  Skin: Negative for pallor.  Neurological: Negative for dizziness and headaches.    Allergies  Review of patient's allergies indicates no known allergies.  Home Medications   Current Outpatient Rx  Name Route Sig Dispense Refill  . ALBUTEROL SULFATE HFA 108 (90 BASE) MCG/ACT IN AERS Inhalation Inhale 2 puffs into the lungs every 6 (six) hours as needed.      . ASPIRIN 81 MG PO TABS Oral Take 81 mg by mouth daily.      . BUDESONIDE-FORMOTEROL FUMARATE 160-4.5 MCG/ACT IN AERO Inhalation Inhale 2 puffs into the lungs 2 (two) times daily. 1 Inhaler 3  . CITALOPRAM HYDROBROMIDE 10 MG PO TABS  TAKE ONE TABLET BY MOUTH EVERY DAY 90 tablet 2  . CLONAZEPAM 0.5 MG PO TABS Oral Take 0.5 mg by mouth 2 (two) times daily as needed.      . ADULT MULTIVITAMIN W/MINERALS [facility]  Oral Take 1 tablet by mouth daily.    Marland Kitchen SIMVASTATIN 40 MG PO TABS Oral Take 1 tablet (40 mg total) by mouth at bedtime. 90 tablet 3  . AZITHROMYCIN 250 MG PO TABS Oral Take 1 tablet (250 mg total) by mouth daily. 4 tablet 0  . BECLOMETHASONE DIPROPIONATE 80 MCG/ACT IN AERS Inhalation Inhale 2 puffs into the lungs 2 (two) times daily. 1 Inhaler 3  . DEXTROMETHORPHAN-GUAIFENESIN 10-200 MG PO CAPS Oral Take 1 tablet by mouth 6 (six) times daily. 30 each 0    BP 140/80  Pulse 79  Temp(Src) 99 F (37.2 C) (Oral)  Resp 18  SpO2 92%  Physical Exam  Constitutional: He is oriented to person, place, and time. He appears well-developed and  well-nourished.  HENT:  Head: Normocephalic and atraumatic.  Nose: Rhinorrhea present.  Mouth/Throat: Oropharynx is clear and moist. No oropharyngeal exudate.  Eyes: Pupils are equal, round, and reactive to light.  Neck: Normal range of motion.  Cardiovascular: Normal rate.   Pulmonary/Chest: Effort normal and breath sounds normal. No respiratory distress. He has no wheezes. He exhibits no tenderness.  Musculoskeletal: Normal range of motion.  Neurological: He is alert and oriented to person, place, and time.  Skin: Skin is warm.    ED Course  Procedures (including critical care time)  Labs Reviewed - No data to display Dg Chest 2 View  11/13/2011  *RADIOLOGY REPORT*  Clinical Data: Cough, asthma.  CHEST - 2 VIEW  Comparison: 08/27/2011  Findings: There is hyperinflation of the lungs compatible with COPD.  Mild peribronchial thickening. Heart and mediastinal contours are within normal limits.  No focal opacities or effusions.  No acute bony abnormality.  IMPRESSION: COPD/chronic changes.  No active disease.  Original Report Authenticated By: Cyndie Chime, M.D.     1. Upper respiratory tract infection   2. COPD (chronic obstructive pulmonary disease)   3. Post-nasal drip       MDM   Use x-ray, which reveals changes of COPD, no pneumonia or consolidations.  I believe that this is a URI with a postnasal drip, but do to his, COPD we'll treat with a course of azithromycin, as well as a decongestant        Arman Filter, NP 11/13/11 2152  Arman Filter, NP 11/13/11 2152

## 2011-11-13 NOTE — ED Notes (Signed)
PT. REPORTS DRY COUGH WITH SOB ONSET TODAY , DENIES FEVER OR CHILLS.

## 2011-11-13 NOTE — Telephone Encounter (Signed)
Caller: Susan/Spouse; PCP: Sanda Linger; CB#: [phone]; ; ; Call regarding Cough/Congestion;  Susan/Spouse calling regarding cough that started 11/13/11 this am. Eyes red from coughing and has been coughing all day. Afebrile. Gave lemon and honey and no relief. Non productive cough. Has hx Asthma and uses inhalers prn (not as directed per spouse), Symbicort/Qvair. Emergent s/s for Cough r/o per protocol except for see in 4 hrs due to new cough and known respiratory condition, spouse states pt is not going to go to the ED and will call for appt in the am. Script states to notify point nurse but all staff has left.

## 2011-11-13 NOTE — Discharge Instructions (Signed)
Chronic Obstructive Pulmonary Disease Chronic obstructive pulmonary disease (COPD) is a condition in which airflow from the lungs is restricted. The lungs can never return to normal, but there are measures you can take which will improve them and make you feel better. CAUSES   Smoking.   Exposure to secondhand smoke.   Breathing in irritants (pollution, cigarette smoke, strong smells, aerosol sprays, paint fumes).   History of lung infections.  TREATMENT  Treatment focuses on making you comfortable (supportive care). Your caregiver may prescribe medications (inhaled or pills) to help improve your breathing. HOME CARE INSTRUCTIONS   If you smoke, stop smoking.   Avoid exposure to smoke, chemicals, and fumes that aggravate your breathing.   Take antibiotic medicines as directed by your caregiver.   Avoid medicines that dry up your system and slow down the elimination of secretions (antihistamines and cough syrups). This decreases respiratory capacity and may lead to infections.   Drink enough water and fluids to keep your urine clear or pale yellow. This loosens secretions.   Use humidifiers at home and at your bedside if they do not make breathing difficult.   Receive all protective vaccines your caregiver suggests, especially pneumococcal and influenza.   Use home oxygen as suggested.   Stay active. Exercise and physical activity will help maintain your ability to do things you want to do.   Eat a healthy diet.  SEEK MEDICAL CARE IF:   You develop pus-like mucus (sputum).   Breathing is more labored or exercise becomes difficult to do.   You are running out of the medicine you take for your breathing.  SEEK IMMEDIATE MEDICAL CARE IF:   You have a rapid heart rate.   You have agitation, confusion, tremors, or are in a stupor (family members may need to observe this).   It becomes difficult to breathe.   You develop chest pain.   You have a fever.  MAKE SURE YOU:    Understand these instructions.   Will watch your condition.   Will get help right away if you are not doing well or get worse.  Document Released: 03/05/2005 Document Revised: 05/15/2011 Document Reviewed: 07/26/2010 [facility] Patient Information 2012 [place], [place].Cough, Adult  A cough is a reflex that helps clear your throat and airways. It can help heal the body or may be a reaction to an irritated airway. A cough may only last 2 or 3 weeks (acute) or may last more than 8 weeks (chronic).  CAUSES Acute cough:  Viral or bacterial infections.  Chronic cough:  Infections.   Allergies.   Asthma.   Post-nasal drip.   Smoking.   Heartburn or acid reflux.   Some medicines.   Chronic lung problems (COPD).   Cancer.  SYMPTOMS   Cough.   Fever.   Chest pain.   Increased breathing rate.   High-pitched whistling sound when breathing (wheezing).   Colored mucus that you cough up (sputum).  TREATMENT   A bacterial cough may be treated with antibiotic medicine.   A viral cough must run its course and will not respond to antibiotics.   Your caregiver may recommend other treatments if you have a chronic cough.  HOME CARE INSTRUCTIONS   Only take over-the-counter or prescription medicines for pain, discomfort, or fever as directed by your caregiver. Use cough suppressants only as directed by your caregiver.   Use a cold steam vaporizer or humidifier in your bedroom or home to help loosen secretions.   Sleep in  a semi-upright position if your cough is worse at night.   Rest as needed.   Stop smoking if you smoke.  SEEK IMMEDIATE MEDICAL CARE IF:   You have pus in your sputum.   Your cough starts to worsen.   You cannot control your cough with suppressants and are losing sleep.   You begin coughing up blood.   You have difficulty breathing.   You develop pain which is getting worse or is uncontrolled with medicine.   You have a fever.  MAKE SURE YOU:    Understand these instructions.   Will watch your condition.   Will get help right away if you are not doing well or get worse.  Document Released: 11/22/2010 Document Revised: 05/15/2011 Document Reviewed: 11/22/2010 [facility] Patient Information 2012 [place], [place].Cough, Adult  A cough is a reflex that helps clear your throat and airways. It can help heal the body or may be a reaction to an irritated airway. A cough may only last 2 or 3 weeks (acute) or may last more than 8 weeks (chronic).  CAUSES Acute cough:  Viral or bacterial infections.  Chronic cough:  Infections.   Allergies.   Asthma.   Post-nasal drip.   Smoking.   Heartburn or acid reflux.   Some medicines.   Chronic lung problems (COPD).   Cancer.  SYMPTOMS   Cough.   Fever.   Chest pain.   Increased breathing rate.   High-pitched whistling sound when breathing (wheezing).   Colored mucus that you cough up (sputum).  TREATMENT   A bacterial cough may be treated with antibiotic medicine.   A viral cough must run its course and will not respond to antibiotics.   Your caregiver may recommend other treatments if you have a chronic cough.  HOME CARE INSTRUCTIONS   Only take over-the-counter or prescription medicines for pain, discomfort, or fever as directed by your caregiver. Use cough suppressants only as directed by your caregiver.   Use a cold steam vaporizer or humidifier in your bedroom or home to help loosen secretions.   Sleep in a semi-upright position if your cough is worse at night.   Rest as needed.   Stop smoking if you smoke.  SEEK IMMEDIATE MEDICAL CARE IF:   You have pus in your sputum.   Your cough starts to worsen.   You cannot control your cough with suppressants and are losing sleep.   You begin coughing up blood.   You have difficulty breathing.   You develop pain which is getting worse or is uncontrolled with medicine.   You have a fever.  MAKE SURE YOU:    Understand these instructions.   Will watch your condition.   Will get help right away if you are not doing well or get worse.  Document Released: 11/22/2010 Document Revised: 05/15/2011 Document Reviewed: 11/22/2010 [facility] Patient Information 2012 [place], [place].

## 2011-11-13 NOTE — ED Notes (Signed)
The pt has been coughing all day today and not feeling well with no appetite.  Unable to sleep tonight dur to his coughing

## 2011-11-14 ENCOUNTER — Ambulatory Visit (INDEPENDENT_AMBULATORY_CARE_PROVIDER_SITE_OTHER): Payer: Medicare Other | Admitting: Internal Medicine

## 2011-11-14 ENCOUNTER — Encounter: Payer: Self-pay | Admitting: Internal Medicine

## 2011-11-14 VITALS — BP 142/80 | HR 82 | Temp 99.5°F | Ht 67.0 in | Wt 196.1 lb

## 2011-11-14 DIAGNOSIS — N189 Chronic kidney disease, unspecified: Secondary | ICD-10-CM

## 2011-11-14 DIAGNOSIS — J209 Acute bronchitis, unspecified: Secondary | ICD-10-CM | POA: Insufficient documentation

## 2011-11-14 DIAGNOSIS — E785 Hyperlipidemia, unspecified: Secondary | ICD-10-CM

## 2011-11-14 DIAGNOSIS — R062 Wheezing: Secondary | ICD-10-CM | POA: Insufficient documentation

## 2011-11-14 MED ORDER — PREDNISONE 10 MG PO TABS: ORAL_TABLET | ORAL | Status: DC

## 2011-11-14 MED ORDER — LEVOFLOXACIN 250 MG PO TABS: 250.0000 mg | ORAL_TABLET | Freq: Every day | ORAL | Status: AC

## 2011-11-14 MED ORDER — METHYLPREDNISOLONE ACETATE 80 MG/ML IJ SUSP
120.0000 mg | Freq: Once | INTRAMUSCULAR | Status: AC
  Administered 2011-11-14: 120 mg via INTRAMUSCULAR

## 2011-11-14 MED ORDER — HYDROCODONE-HOMATROPINE 5-1.5 MG/5ML PO SYRP: 5.0000 mL | ORAL_SOLUTION | Freq: Four times a day (QID) | ORAL | Status: AC | PRN

## 2011-11-14 NOTE — Telephone Encounter (Signed)
Ok to come today

## 2011-11-14 NOTE — Telephone Encounter (Signed)
Pt went to the ER last night per his wife.  She didn't think he would want to come today.  I made him an ER FU for Tues June 11.

## 2011-11-14 NOTE — Telephone Encounter (Signed)
Dr. Jonny Ruiz is PCP

## 2011-11-14 NOTE — Patient Instructions (Signed)
Take all new medications as prescribed Continue all other medications as before OK to continue the coricidin

## 2011-11-15 ENCOUNTER — Encounter: Payer: Self-pay | Admitting: Internal Medicine

## 2011-11-15 MED ORDER — SIMVASTATIN 40 MG PO TABS: 40.0000 mg | ORAL_TABLET | Freq: Every day | ORAL | Status: DC

## 2011-11-15 MED ORDER — CITALOPRAM HYDROBROMIDE 10 MG PO TABS: 10.0000 mg | ORAL_TABLET | Freq: Every day | ORAL | Status: DC

## 2011-11-15 NOTE — Progress Notes (Signed)
Subjective:    Patient ID: Richard Stanley, male    DOB: [birth year], 76 y.o.   MRN: [id number]  HPI  Here with family, having been seen June 6 in ER with URI/COPD, for f/u today unfortunately with feverish feeling, increased ST, prod cough, upper fleeting sharp CP with cough, mild wheeze/sob and weakness with a fall 2 days ago, bruise to the knee, abrasion to the arm. but Pt denies other chest pain, increased sob or doe, wheezing, orthopnea, PND, increased LE swelling, palpitations, dizziness or syncope.   Pt denies recent wt loss, night sweats, loss of appetite, or other constitutional symptoms   Pt denies polydipsia, polyuria. Pt denies new neurological symptoms such as new headache, or facial or extremity weakness or numbness.    Trying to follow lower chol diet, Overall good compliance with treatment, and good medicine tolerability.  Past Medical History  Diagnosis Date  . COPD (chronic obstructive pulmonary disease)   . Nodule of right lung   . Hyperlipidemia   . ED (erectile dysfunction)   . Depression   . Anxiety   . ANXIETY 05/13/2010  . Asthma 09/26/2010  . DEPRESSION 06/22/2009  . HYPERLIPIDEMIA 06/18/2009  . 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 Other diseases of lung, not elsewhere classified 06/18/2009  . Thyroid nodule 10/01/2010  . Rash 10/01/2010   Past Surgical History  Procedure Date  . Cardiac catheterization 1998    normal per pt    reports that he quit smoking about 22 years ago. His smoking use included Cigarettes. He has a 38 pack-year smoking history. He does not have any smokeless tobacco history on file. He reports that he does not drink alcohol or use illicit drugs. family history includes Cancer in his father; Colon cancer in his father; and Other in his sister. No Known Allergies Current Outpatient Prescriptions on File Prior to Visit  Medication  Sig Dispense Refill  . albuterol (PROAIR HFA) 108 (90 BASE) MCG/ACT inhaler Inhale 2 puffs into the lungs every 6 (six) hours as needed.        Marland Kitchen aspirin 81 MG tablet Take 81 mg by mouth daily.        . budesonide-formoterol (SYMBICORT) 160-4.5 MCG/ACT inhaler Inhale 2 puffs into the lungs 2 (two) times daily.  1 Inhaler  3  . citalopram (CELEXA) 10 MG tablet TAKE ONE TABLET BY MOUTH EVERY DAY  90 tablet  2  . clonazePAM (KLONOPIN) 0.5 MG tablet Take 0.5 mg by mouth 2 (two) times daily as needed.        Marland Kitchen Dextromethorphan-Guaifenesin (CORICIDIN HBP CONGESTION/COUGH) 10-200 MG CAPS Take 1 tablet by mouth 6 (six) times daily.  30 each  0  . Multiple Vitamin (MULTIVITAMIN WITH MINERALS) TABS Take 1 tablet by mouth daily.      . simvastatin (ZOCOR) 40 MG tablet Take 1 tablet (40 mg total) by mouth at bedtime.  90 tablet  3  . beclomethasone (QVAR) 80 MCG/ACT inhaler Inhale 2 puffs into the lungs 2 (two) times daily.  1 Inhaler  3   No current facility-administered medications on file prior to visit.   Review of Systems Review of Systems  Constitutional: Negative for diaphoresis and unexpected weight change.  HENT: Negative for drooling and tinnitus.   Eyes: Negative for photophobia and visual disturbance.  Respiratory: Negative for choking and stridor.   Gastrointestinal: Negative for vomiting and blood  in stool.  Genitourinary: Negative for hematuria and decreased urine volume.  Skin: Negative for color change and wound.  Neurological: Negative for tremors and numbness.  Psychiatric/Behavioral: Negative for decreased concentration. The patient is not hyperactive.      Objective:   Physical Exam BP 142/80  Pulse 82  Temp(Src) 99.5 F (37.5 C) (Oral)  Ht 5\' 7"  (1.702 m)  Wt 196 lb 2 oz (88.962 kg)  BMI 30.72 kg/m2  SpO2 90% Physical Exam  VS noted, mild ill Constitutional: Pt appears well-developed and well-nourished.  HENT: Head: Normocephalic.  Right Ear: External ear normal.    Left Ear: External ear normal.  Bilat tm's mild erythema.  Sinus nontender.  Pharynx mild erythema Eyes: Conjunctivae and EOM are normal. Pupils are equal, round, and reactive to light.  Neck: Normal range of motion. Neck supple.  Cardiovascular: Normal rate and regular rhythm.   Pulmonary/Chest: Effort normal and breath sounds mild decreased with bilat wheeze, no rale Neurological: Pt is alert. Not worsening confusion Skin: Skin is warm. No erythema. No LE edema Psychiatric: Pt behavior is normal. Not depressed affect    Assessment & Plan:

## 2011-11-15 NOTE — Assessment & Plan Note (Signed)
Mild to mod, for antibx course,  to f/u any worsening symptoms or concerns 

## 2011-11-15 NOTE — Assessment & Plan Note (Signed)
Mild to mod, for depomedrol and predpack asd,  to f/u any worsening symptoms or concerns 

## 2011-11-15 NOTE — Assessment & Plan Note (Signed)
stable overall by hx and exam, most recent data reviewed with pt, and pt to continue medical treatment as before Lab Results  Component Value Date   CREATININE 1.5 08/27/2011

## 2011-11-15 NOTE — ED Provider Notes (Signed)
Medical screening examination/treatment/procedure(s) were performed by non-physician practitioner and as supervising physician I was immediately available for consultation/collaboration.   Mahrosh Donnell A Randi Poullard, MD 11/15/11 0744 

## 2011-11-15 NOTE — Assessment & Plan Note (Signed)
stable overall by hx and exam, most recent data reviewed with pt, and pt to continue medical treatment as before Lab Results  Component Value Date   LDLCALC 61 07/19/2009

## 2011-11-18 ENCOUNTER — Ambulatory Visit: Payer: Medicare Other | Admitting: Internal Medicine

## 2011-11-25 ENCOUNTER — Ambulatory Visit (INDEPENDENT_AMBULATORY_CARE_PROVIDER_SITE_OTHER)
Admission: RE | Admit: 2011-11-25 | Discharge: 2011-11-25 | Disposition: A | Discharge status: Home / Self Care | Payer: Medicare Other | Source: Ambulatory Visit | Attending: Internal Medicine | Admitting: Internal Medicine

## 2011-11-25 ENCOUNTER — Ambulatory Visit (INDEPENDENT_AMBULATORY_CARE_PROVIDER_SITE_OTHER): Payer: Medicare Other | Admitting: Internal Medicine

## 2011-11-25 ENCOUNTER — Encounter: Payer: Self-pay | Admitting: Internal Medicine

## 2011-11-25 VITALS — BP 138/74 | HR 63 | Temp 97.1°F | Ht 67.5 in | Wt 192.4 lb

## 2011-11-25 DIAGNOSIS — R21 Rash and other nonspecific skin eruption: Secondary | ICD-10-CM

## 2011-11-25 DIAGNOSIS — F411 Generalized anxiety disorder: Secondary | ICD-10-CM

## 2011-11-25 DIAGNOSIS — M549 Dorsalgia, unspecified: Secondary | ICD-10-CM

## 2011-11-25 MED ORDER — CYCLOBENZAPRINE HCL 5 MG PO TABS: 5.0000 mg | ORAL_TABLET | Freq: Three times a day (TID) | ORAL | Status: AC | PRN

## 2011-11-25 MED ORDER — TRIAMCINOLONE ACETONIDE 0.1 % EX CREA: TOPICAL_CREAM | Freq: Two times a day (BID) | CUTANEOUS | Status: AC

## 2011-11-25 MED ORDER — TRAMADOL HCL 50 MG PO TABS: 50.0000 mg | ORAL_TABLET | Freq: Four times a day (QID) | ORAL | Status: AC | PRN

## 2011-11-25 NOTE — Patient Instructions (Addendum)
Take all new medications as prescribed Continue all other medications as before Please have the pharmacy call with any refills you may need. Please go to XRAY in the Basement for the x-ray test You will be contacted by phone if any changes need to be made immediately.  Otherwise, you will receive a letter about your results with an explanation.  

## 2011-11-30 ENCOUNTER — Encounter: Payer: Self-pay | Admitting: Internal Medicine

## 2011-11-30 NOTE — Assessment & Plan Note (Signed)
Exam benign, suspect underlying lumbar disc dz or DJD, for pain control, muscle relaxer trial,  to f/u any worsening symptoms or concerns, also for films to r/o fx with hx of fall

## 2011-11-30 NOTE — Assessment & Plan Note (Signed)
With itch and exac by picking at lesions, for triam cr prn,  to f/u any worsening symptoms or concerns

## 2011-11-30 NOTE — Assessment & Plan Note (Signed)
stable overall by hx and exam, most recent data reviewed with pt, and pt to continue medical treatment as before Lab Results  Component Value Date   WBC 9.0 08/27/2011   HGB 16.2 08/27/2011   HCT 49.1 08/27/2011   PLT 185.0 08/27/2011   GLUCOSE 106* 08/27/2011   CHOL 139 07/19/2009   TRIG 172.0* 07/19/2009   HDL 43.80 07/19/2009   LDLDIRECT 159.8 06/18/2009   LDLCALC 61 07/19/2009   ALT 25 08/27/2011   AST 22 08/27/2011   NA 140 08/27/2011   K 4.3 08/27/2011   CL 104 08/27/2011   CREATININE 1.5 08/27/2011   BUN 26* 08/27/2011   CO2 29 08/27/2011   TSH 3.19 08/27/2011   PSA 1.11 06/18/2009   MICROALBUR 1.0 06/18/2009

## 2011-11-30 NOTE — Progress Notes (Signed)
Subjective:    Patient ID: Richard Stanley, male    DOB: May 18, 1936, 76 y.o.   MRN: [id number]  HPI  Here with c/o sharp stabbing LBP at least 6 times per day for the last month, worse to stand, bend, twist but no bowel or bladder change, fever, wt loss,  worsening LE pain/numbness/weakness, gait change or falls, except for the fall in march 2013 to his backside, still walks with cane.  Pt denies chest pain, increased sob or doe, wheezing, orthopnea, PND, increased LE swelling, palpitations, dizziness or syncope.  Pt denies new neurological symptoms such as new headache, or facial or extremity weakness or numbness   Pt denies polydipsia, polyuria. Also tends to pick at his arms with itch and small rash with several areas redness.  Denies worsening depressive symptoms, suicidal ideation, or panic   Past Medical History  Diagnosis Date  . COPD (chronic obstructive pulmonary disease)   . Nodule of right lung   . Hyperlipidemia   . ED (erectile dysfunction)   . Depression   . Anxiety   . ANXIETY 05/13/2010  . Asthma 09/26/2010  . DEPRESSION 06/22/2009  . HYPERLIPIDEMIA 06/18/2009  . 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 Other diseases of lung, not elsewhere classified 06/18/2009  . Thyroid nodule 10/01/2010  . Rash 10/01/2010   Past Surgical History  Procedure Date  . Cardiac catheterization 1998    normal per pt    reports that he quit smoking about 22 years ago. His smoking use included Cigarettes. He has a 38 pack-year smoking history. He does not have any smokeless tobacco history on file. He reports that he does not drink alcohol or use illicit drugs. family history includes Cancer in his father; Colon cancer in his father; and Other in his sister. No Known Allergies Current Outpatient Prescriptions on File Prior to Visit  Medication Sig Dispense Refill  . albuterol (PROAIR  HFA) 108 (90 BASE) MCG/ACT inhaler Inhale 2 puffs into the lungs every 6 (six) hours as needed.        Marland Kitchen aspirin 81 MG tablet Take 81 mg by mouth daily.        . budesonide-formoterol (SYMBICORT) 160-4.5 MCG/ACT inhaler Inhale 2 puffs into the lungs 2 (two) times daily.  1 Inhaler  3  . citalopram (CELEXA) 10 MG tablet Take 1 tablet (10 mg total) by mouth daily.  90 tablet  3  . clonazePAM (KLONOPIN) 0.5 MG tablet Take 0.5 mg by mouth 2 (two) times daily as needed.        Marland Kitchen Dextromethorphan-Guaifenesin (CORICIDIN HBP CONGESTION/COUGH) 10-200 MG CAPS Take 1 tablet by mouth 6 (six) times daily.  30 each  0  . Multiple Vitamin (MULTIVITAMIN WITH MINERALS) TABS Take 1 tablet by mouth daily.      . simvastatin (ZOCOR) 40 MG tablet Take 1 tablet (40 mg total) by mouth at bedtime.  90 tablet  3  . beclomethasone (QVAR) 80 MCG/ACT inhaler Inhale 2 puffs into the lungs 2 (two) times daily.  1 Inhaler  3   Review of Systems Review of Systems  Constitutional: Negative for diaphoresis and unexpected weight change.  HENT: Negative for drooling and tinnitus.   Eyes: Negative for photophobia and visual disturbance.  Respiratory: Negative for choking and stridor.   Gastrointestinal: Negative for vomiting and blood in stool.  Genitourinary: Negative for hematuria and decreased urine volume.  Musculoskeletal: Negative for joint swelling Skin: Negative for color change and wound.  Neurological: Negative for tremors and numbness.  Psychiatric/Behavioral: Negative for decreased concentration. The patient is not hyperactive.       Objective:   Physical Exam BP 138/74  Pulse 63  Temp 97.1 F (36.2 C) (Oral)  Ht 5' 7.5" (1.715 m)  Wt 192 lb 6 oz (87.261 kg)  BMI 29.69 kg/m2  SpO2 94% Physical Exam  VS noted Constitutional: Pt appears well-developed and well-nourished.  HENT: Head: Normocephalic.  Right Ear: External ear normal.  Left Ear: External ear normal.  Eyes: Conjunctivae and EOM are normal.  Pupils are equal, round, and reactive to light.  Neck: Normal range of motion. Neck supple.  Cardiovascular: Normal rate and regular rhythm.   Pulmonary/Chest: Effort normal and breath sounds normal.  Abd:  Soft, NT, non-distended, + BS Neurological: Pt is alert. No cranial nerve deficit. motor/dtr/gait intact Skin: Skin is warm. No erythema. except for numerous small erythem lesions scab like to arms Spine:  nontender Psychiatric: Pt behavior is normal. Thought content normal. 1+ nervous    Assessment & Plan:

## 2011-12-18 ENCOUNTER — Ambulatory Visit: Payer: Medicare Other | Admitting: Internal Medicine

## 2013-01-14 ENCOUNTER — Ambulatory Visit (INDEPENDENT_AMBULATORY_CARE_PROVIDER_SITE_OTHER): Payer: 59 | Admitting: General Surgery

## 2013-01-28 ENCOUNTER — Other Ambulatory Visit: Payer: Self-pay | Admitting: Internal Medicine

## 2017-06-30 ENCOUNTER — Institutional Professional Consult (permissible substitution): Payer: Self-pay | Admitting: Pulmonary Disease

## 2017-08-16 ENCOUNTER — Emergency Department (HOSPITAL_COMMUNITY): Payer: Medicare HMO

## 2017-08-16 ENCOUNTER — Observation Stay (HOSPITAL_COMMUNITY)
Admission: EM | Admit: 2017-08-16 | Discharge: 2017-08-18 | Disposition: A | Discharge status: Home / Self Care | Payer: Medicare HMO | Attending: Internal Medicine | Admitting: Internal Medicine

## 2017-08-16 ENCOUNTER — Other Ambulatory Visit: Payer: Self-pay

## 2017-08-16 ENCOUNTER — Encounter (HOSPITAL_COMMUNITY): Payer: Self-pay | Admitting: Emergency Medicine

## 2017-08-16 DIAGNOSIS — Z87891 Personal history of nicotine dependence: Secondary | ICD-10-CM | POA: Insufficient documentation

## 2017-08-16 DIAGNOSIS — Y92000 Kitchen of unspecified non-institutional (private) residence as  the place of occurrence of the external cause: Secondary | ICD-10-CM | POA: Insufficient documentation

## 2017-08-16 DIAGNOSIS — Z79899 Other long term (current) drug therapy: Secondary | ICD-10-CM | POA: Insufficient documentation

## 2017-08-16 DIAGNOSIS — S82831A Other fracture of upper and lower end of right fibula, initial encounter for closed fracture: Secondary | ICD-10-CM | POA: Insufficient documentation

## 2017-08-16 DIAGNOSIS — R2681 Unsteadiness on feet: Secondary | ICD-10-CM | POA: Insufficient documentation

## 2017-08-16 DIAGNOSIS — E785 Hyperlipidemia, unspecified: Secondary | ICD-10-CM | POA: Diagnosis present

## 2017-08-16 DIAGNOSIS — W010XXA Fall on same level from slipping, tripping and stumbling without subsequent striking against object, initial encounter: Secondary | ICD-10-CM | POA: Insufficient documentation

## 2017-08-16 DIAGNOSIS — F418 Other specified anxiety disorders: Secondary | ICD-10-CM | POA: Diagnosis present

## 2017-08-16 DIAGNOSIS — I4891 Unspecified atrial fibrillation: Secondary | ICD-10-CM | POA: Diagnosis present

## 2017-08-16 DIAGNOSIS — N183 Chronic kidney disease, stage 3 unspecified: Secondary | ICD-10-CM | POA: Diagnosis present

## 2017-08-16 DIAGNOSIS — I129 Hypertensive chronic kidney disease with stage 1 through stage 4 chronic kidney disease, or unspecified chronic kidney disease: Secondary | ICD-10-CM | POA: Insufficient documentation

## 2017-08-16 DIAGNOSIS — R42 Dizziness and giddiness: Secondary | ICD-10-CM | POA: Insufficient documentation

## 2017-08-16 DIAGNOSIS — J449 Chronic obstructive pulmonary disease, unspecified: Secondary | ICD-10-CM | POA: Diagnosis present

## 2017-08-16 DIAGNOSIS — D72829 Elevated white blood cell count, unspecified: Secondary | ICD-10-CM | POA: Diagnosis present

## 2017-08-16 DIAGNOSIS — Z7982 Long term (current) use of aspirin: Secondary | ICD-10-CM | POA: Insufficient documentation

## 2017-08-16 DIAGNOSIS — I480 Paroxysmal atrial fibrillation: Principal | ICD-10-CM | POA: Insufficient documentation

## 2017-08-16 DIAGNOSIS — S82891A Other fracture of right lower leg, initial encounter for closed fracture: Secondary | ICD-10-CM | POA: Diagnosis present

## 2017-08-16 DIAGNOSIS — W19XXXA Unspecified fall, initial encounter: Secondary | ICD-10-CM | POA: Diagnosis present

## 2017-08-16 HISTORY — DX: Chronic kidney disease, stage 3 (moderate): N18.3

## 2017-08-16 HISTORY — DX: Unspecified atrial fibrillation: I48.91

## 2017-08-16 HISTORY — DX: Essential (primary) hypertension: I10

## 2017-08-16 HISTORY — DX: Chronic kidney disease, stage 3 unspecified: N18.30

## 2017-08-16 LAB — CBG MONITORING, ED: Glucose-Capillary: 110 mg/dL — ABNORMAL HIGH (ref 65–99)

## 2017-08-16 NOTE — ED Triage Notes (Addendum)
Pt states he went to kitchen to get coffee just pta and both his feet "gaveway" and he fell.  C/o R ankle pain and swelling.  Denies feeling dizzy, slipping, or tripping.  States he did have a headache approx 30 min prior to falling.  Denies headache at present.

## 2017-08-17 ENCOUNTER — Observation Stay (HOSPITAL_COMMUNITY): Payer: Medicare HMO

## 2017-08-17 ENCOUNTER — Observation Stay (HOSPITAL_BASED_OUTPATIENT_CLINIC_OR_DEPARTMENT_OTHER): Payer: Medicare HMO

## 2017-08-17 ENCOUNTER — Encounter (HOSPITAL_COMMUNITY): Payer: Self-pay | Admitting: Internal Medicine

## 2017-08-17 DIAGNOSIS — I10 Essential (primary) hypertension: Secondary | ICD-10-CM | POA: Insufficient documentation

## 2017-08-17 DIAGNOSIS — I4891 Unspecified atrial fibrillation: Secondary | ICD-10-CM

## 2017-08-17 DIAGNOSIS — J449 Chronic obstructive pulmonary disease, unspecified: Secondary | ICD-10-CM | POA: Diagnosis present

## 2017-08-17 DIAGNOSIS — F418 Other specified anxiety disorders: Secondary | ICD-10-CM | POA: Diagnosis not present

## 2017-08-17 DIAGNOSIS — S82891A Other fracture of right lower leg, initial encounter for closed fracture: Secondary | ICD-10-CM | POA: Diagnosis present

## 2017-08-17 DIAGNOSIS — E7800 Pure hypercholesterolemia, unspecified: Secondary | ICD-10-CM

## 2017-08-17 DIAGNOSIS — N183 Chronic kidney disease, stage 3 (moderate): Secondary | ICD-10-CM

## 2017-08-17 DIAGNOSIS — D72829 Elevated white blood cell count, unspecified: Secondary | ICD-10-CM | POA: Diagnosis not present

## 2017-08-17 DIAGNOSIS — S82831A Other fracture of upper and lower end of right fibula, initial encounter for closed fracture: Secondary | ICD-10-CM | POA: Diagnosis not present

## 2017-08-17 DIAGNOSIS — E785 Hyperlipidemia, unspecified: Secondary | ICD-10-CM | POA: Diagnosis present

## 2017-08-17 DIAGNOSIS — W19XXXA Unspecified fall, initial encounter: Secondary | ICD-10-CM | POA: Diagnosis not present

## 2017-08-17 LAB — CBC
HCT: 46.7 % (ref 39.0–52.0)
HCT: 52.3 % — ABNORMAL HIGH (ref 39.0–52.0)
Hemoglobin: 15.1 g/dL (ref 13.0–17.0)
Hemoglobin: 17.1 g/dL — ABNORMAL HIGH (ref 13.0–17.0)
MCH: 30 pg (ref 26.0–34.0)
MCH: 30.4 pg (ref 26.0–34.0)
MCHC: 32.3 g/dL (ref 30.0–36.0)
MCHC: 32.7 g/dL (ref 30.0–36.0)
MCV: 92.8 fL (ref 78.0–100.0)
MCV: 92.9 fL (ref 78.0–100.0)
PLATELETS: 164 10*3/uL (ref 150–400)
PLATELETS: 230 10*3/uL (ref 150–400)
RBC: 5.03 MIL/uL (ref 4.22–5.81)
RBC: 5.63 MIL/uL (ref 4.22–5.81)
RDW: 12.9 % (ref 11.5–15.5)
RDW: 12.9 % (ref 11.5–15.5)
WBC: 12.5 10*3/uL — AB (ref 4.0–10.5)
WBC: 8.1 10*3/uL (ref 4.0–10.5)

## 2017-08-17 LAB — HEMOGLOBIN A1C
Hgb A1c MFr Bld: 5.4 % (ref 4.8–5.6)
Mean Plasma Glucose: 108.28 mg/dL

## 2017-08-17 LAB — BASIC METABOLIC PANEL
Anion gap: 10 (ref 5–15)
Anion gap: 8 (ref 5–15)
BUN: 23 mg/dL — AB (ref 6–20)
BUN: 25 mg/dL — AB (ref 6–20)
CALCIUM: 8.7 mg/dL — AB (ref 8.9–10.3)
CO2: 25 mmol/L (ref 22–32)
CO2: 27 mmol/L (ref 22–32)
CREATININE: 1.77 mg/dL — AB (ref 0.61–1.24)
CREATININE: 1.84 mg/dL — AB (ref 0.61–1.24)
Calcium: 8.2 mg/dL — ABNORMAL LOW (ref 8.9–10.3)
Chloride: 104 mmol/L (ref 101–111)
Chloride: 105 mmol/L (ref 101–111)
GFR calc Af Amer: 40 mL/min — ABNORMAL LOW (ref >60)
GFR calc non Af Amer: 33 mL/min — ABNORMAL LOW (ref >60)
GFR, EST AFRICAN AMERICAN: 38 mL/min — AB (ref >60)
GFR, EST NON AFRICAN AMERICAN: 35 mL/min — AB (ref >60)
Glucose, Bld: 104 mg/dL — ABNORMAL HIGH (ref 65–99)
Glucose, Bld: 137 mg/dL — ABNORMAL HIGH (ref 65–99)
Potassium: 3.9 mmol/L (ref 3.5–5.1)
Potassium: 4.2 mmol/L (ref 3.5–5.1)
SODIUM: 141 mmol/L (ref 135–145)
Sodium: 138 mmol/L (ref 135–145)

## 2017-08-17 LAB — RAPID URINE DRUG SCREEN, HOSP PERFORMED
Amphetamines: NOT DETECTED
Barbiturates: NOT DETECTED
Benzodiazepines: NOT DETECTED
Cocaine: NOT DETECTED
Opiates: POSITIVE — AB
TETRAHYDROCANNABINOL: NOT DETECTED

## 2017-08-17 LAB — LIPID PANEL
Cholesterol: 159 mg/dL (ref 0–200)
HDL: 40 mg/dL — AB (ref >40)
LDL CALC: 77 mg/dL (ref 0–99)
TRIGLYCERIDES: 209 mg/dL — AB (ref <150)
Total CHOL/HDL Ratio: 4 RATIO
VLDL: 42 mg/dL — AB (ref 0–40)

## 2017-08-17 LAB — URINALYSIS, ROUTINE W REFLEX MICROSCOPIC
Bilirubin Urine: NEGATIVE
Glucose, UA: NEGATIVE mg/dL
Hgb urine dipstick: NEGATIVE
KETONES UR: 5 mg/dL — AB
LEUKOCYTES UA: NEGATIVE
Nitrite: NEGATIVE
PROTEIN: NEGATIVE mg/dL
Specific Gravity, Urine: 1.028 (ref 1.005–1.030)
pH: 5 (ref 5.0–8.0)

## 2017-08-17 LAB — TROPONIN I: Troponin I: <0.03 ng/mL (ref <0.03)

## 2017-08-17 LAB — APTT: APTT: 26 s (ref 24–36)

## 2017-08-17 LAB — I-STAT TROPONIN, ED: Troponin i, poc: 0 ng/mL (ref 0.00–0.08)

## 2017-08-17 LAB — PROTIME-INR
INR: 0.93
Prothrombin Time: 12.4 seconds (ref 11.4–15.2)

## 2017-08-17 LAB — TYPE AND SCREEN
ABO/RH(D): A POS
Antibody Screen: NEGATIVE

## 2017-08-17 LAB — BRAIN NATRIURETIC PEPTIDE: B NATRIURETIC PEPTIDE 5: 277.1 pg/mL — AB (ref 0.0–100.0)

## 2017-08-17 LAB — TSH: TSH: 4.376 u[IU]/mL (ref 0.350–4.500)

## 2017-08-17 LAB — ABO/RH: ABO/RH(D): A POS

## 2017-08-17 MED ORDER — ACETAMINOPHEN 650 MG RE SUPP: 650.0000 mg | Freq: Four times a day (QID) | RECTAL | Status: DC | PRN

## 2017-08-17 MED ORDER — BECLOMETHASONE DIPROPIONATE 80 MCG/ACT IN AERS: 2.0000 | INHALATION_SPRAY | Freq: Two times a day (BID) | RESPIRATORY_TRACT | Status: DC

## 2017-08-17 MED ORDER — DILTIAZEM HCL 30 MG PO TABS: 30.0000 mg | ORAL_TABLET | Freq: Two times a day (BID) | ORAL | Status: DC

## 2017-08-17 MED ORDER — ATORVASTATIN CALCIUM 20 MG PO TABS
20.0000 mg | ORAL_TABLET | Freq: Every day | ORAL | Status: DC
  Administered 2017-08-17: 20 mg via ORAL
  Filled 2017-08-17: qty 1

## 2017-08-17 MED ORDER — METHOCARBAMOL 500 MG PO TABS
500.0000 mg | ORAL_TABLET | Freq: Three times a day (TID) | ORAL | Status: DC | PRN
  Administered 2017-08-17: 500 mg via ORAL
  Filled 2017-08-17: qty 1

## 2017-08-17 MED ORDER — HEPARIN SODIUM (PORCINE) 5000 UNIT/ML IJ SOLN
5000.0000 [IU] | Freq: Three times a day (TID) | INTRAMUSCULAR | Status: DC
  Administered 2017-08-17: 5000 [IU] via SUBCUTANEOUS
  Filled 2017-08-17: qty 1

## 2017-08-17 MED ORDER — HYDRALAZINE HCL 20 MG/ML IJ SOLN: 5.0000 mg | INTRAMUSCULAR | Status: DC | PRN

## 2017-08-17 MED ORDER — SIMVASTATIN 40 MG PO TABS
40.0000 mg | ORAL_TABLET | Freq: Every day | ORAL | Status: DC
  Filled 2017-08-17: qty 1

## 2017-08-17 MED ORDER — ADULT MULTIVITAMIN W/MINERALS CH
1.0000 | ORAL_TABLET | Freq: Every day | ORAL | Status: DC
  Administered 2017-08-17 – 2017-08-18 (×2): 1 via ORAL
  Filled 2017-08-17 (×2): qty 1

## 2017-08-17 MED ORDER — DILTIAZEM HCL ER COATED BEADS 120 MG PO CP24
120.0000 mg | ORAL_CAPSULE | Freq: Every day | ORAL | Status: DC
  Administered 2017-08-17 – 2017-08-18 (×2): 120 mg via ORAL
  Filled 2017-08-17 (×2): qty 1

## 2017-08-17 MED ORDER — DEXTROMETHORPHAN-GUAIFENESIN 10-200 MG PO CAPS: 1.0000 | ORAL_CAPSULE | Freq: Every day | ORAL | Status: DC

## 2017-08-17 MED ORDER — ACETAMINOPHEN 325 MG PO TABS: 650.0000 mg | ORAL_TABLET | Freq: Four times a day (QID) | ORAL | Status: DC | PRN

## 2017-08-17 MED ORDER — CLONAZEPAM 0.5 MG PO TABS
0.5000 mg | ORAL_TABLET | Freq: Two times a day (BID) | ORAL | Status: DC | PRN
  Administered 2017-08-17: 0.5 mg via ORAL
  Filled 2017-08-17: qty 1

## 2017-08-17 MED ORDER — ALBUTEROL SULFATE (2.5 MG/3ML) 0.083% IN NEBU: 2.5000 mg | INHALATION_SOLUTION | RESPIRATORY_TRACT | Status: DC | PRN

## 2017-08-17 MED ORDER — OXYCODONE-ACETAMINOPHEN 5-325 MG PO TABS
1.0000 | ORAL_TABLET | Freq: Four times a day (QID) | ORAL | Status: DC | PRN
  Administered 2017-08-17 (×3): 1 via ORAL
  Filled 2017-08-17 (×3): qty 1

## 2017-08-17 MED ORDER — BUDESONIDE 0.5 MG/2ML IN SUSP
0.5000 mg | Freq: Two times a day (BID) | RESPIRATORY_TRACT | Status: DC
  Administered 2017-08-17 – 2017-08-18 (×2): 0.5 mg via RESPIRATORY_TRACT
  Filled 2017-08-17 (×4): qty 2

## 2017-08-17 MED ORDER — ZOLPIDEM TARTRATE 5 MG PO TABS
5.0000 mg | ORAL_TABLET | Freq: Every evening | ORAL | Status: DC | PRN
  Administered 2017-08-17: 5 mg via ORAL
  Filled 2017-08-17: qty 1

## 2017-08-17 MED ORDER — ONDANSETRON HCL 4 MG PO TABS
4.0000 mg | ORAL_TABLET | Freq: Four times a day (QID) | ORAL | Status: DC | PRN
  Administered 2017-08-17: 4 mg via ORAL
  Filled 2017-08-17: qty 1

## 2017-08-17 MED ORDER — POLYETHYLENE GLYCOL 3350 17 G PO PACK: 17.0000 g | PACK | Freq: Every day | ORAL | Status: DC | PRN

## 2017-08-17 MED ORDER — SODIUM CHLORIDE 0.9 % IV SOLN
INTRAVENOUS | Status: DC
  Administered 2017-08-17: 03:00:00 via INTRAVENOUS

## 2017-08-17 MED ORDER — CITALOPRAM HYDROBROMIDE 20 MG PO TABS
10.0000 mg | ORAL_TABLET | Freq: Every day | ORAL | Status: DC
  Administered 2017-08-17 – 2017-08-18 (×2): 10 mg via ORAL
  Filled 2017-08-17 (×2): qty 1

## 2017-08-17 MED ORDER — APIXABAN 2.5 MG PO TABS
2.5000 mg | ORAL_TABLET | Freq: Two times a day (BID) | ORAL | Status: DC
  Administered 2017-08-17 – 2017-08-18 (×2): 2.5 mg via ORAL
  Filled 2017-08-17 (×3): qty 1

## 2017-08-17 MED ORDER — ONDANSETRON HCL 4 MG/2ML IJ SOLN: 4.0000 mg | Freq: Four times a day (QID) | INTRAMUSCULAR | Status: DC | PRN

## 2017-08-17 MED ORDER — ASPIRIN 81 MG PO CHEW
81.0000 mg | CHEWABLE_TABLET | Freq: Every day | ORAL | Status: DC
  Administered 2017-08-17 – 2017-08-18 (×2): 81 mg via ORAL
  Filled 2017-08-17 (×2): qty 1

## 2017-08-17 NOTE — Progress Notes (Signed)
Pt arrived from ED to room. Pt a&ox4. Denies pain. Family at bedside. Discussed POC, no questions at this time.

## 2017-08-17 NOTE — H&P (Signed)
History and Physical    Richard Stanley ZOX:[id number] DOB: 1936-02-11 DOA: 08/16/2017  Referring MD/NP/PA:   PCP: Chesley Noon, MD   Patient coming from:  The patient is coming from home.  At baseline, pt is independent for most of ADL.   Chief Complaint: fall and right ankle pain  HPI: Richard Stanley is a 82 y.o. male with medical history significant of hyperlipidemia, COPD, asthma, PAF not on anticoagulants, depression with anxiety, CKD-3, who presents with fall and right ankle pain.  Pt states that went to kitchen to get coffee, and suddenly felt dizzy and both his feet "gaveway" and he fell. Patient did not have LOC. Denies unilateral weakness, numbness or tingling in extremities. No facial droop, slurred speech. No vision change or hearing loss. Patient denies chest pain, SOB, cough. No fever or chills. Denies nausea, vomiting, diarrhea, abdominal pain, symptoms of UTI. Patient complains of right ankle swelling and moderate pain after fall.  The pain is is constant, moderate, nonradiating, sharp. Patient was found to have atrial fibrillation with RVR, with heart rate up 160 initially in ED, then converted to sinus rhythm spontaneously. Currently heart rate is 70-90s.  ED Course: pt was found to have WBC 12.5, slightly worsening renal function, temperature normal, oxygen saturation 93-95 percent on room air. X-ray of the right ankle showed oblique fracture through distal fibula. Pt is placed on tele bed for obs. Ortho, Dr. Mardelle Matte was consulted.   Review of Systems:   General: no fevers, chills, no body weight gain, has fatigue HEENT: no blurry vision, hearing changes or sore throat Respiratory: no dyspnea, coughing, wheezing CV: no chest pain, no palpitations GI: no nausea, vomiting, abdominal pain, diarrhea, constipation GU: no dysuria, burning on urination, increased urinary frequency, hematuria  Ext: no leg edema Neuro: no unilateral weakness, numbness, or tingling, no vision  change or hearing loss. Has fall. Skin: No skin tear. MSK: has right ankle swelling and pain Heme: No easy bruising.  Travel history: No recent long distant travel.  Allergy: No Known Allergies  Past Medical History:  Diagnosis Date  . Anxiety   . ANXIETY 05/13/2010  . Asthma 09/26/2010  . 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 COPD (chronic obstructive pulmonary disease) ([place])   . Depression   . DEPRESSION 06/22/2009  . ED (erectile dysfunction)   . 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 HLD (hyperlipidemia)   . Hyperlipidemia   . HYPERLIPIDEMIA 06/18/2009  . Nodule of right lung   . Other diseases of lung, not elsewhere classified 06/18/2009  . Rash 10/01/2010  . Thyroid nodule 10/01/2010    Past Surgical History:  Procedure Laterality Date  . CARDIAC CATHETERIZATION  1998   normal per pt    Social History:  reports that he quit smoking about 28 years ago. His smoking use included cigarettes. He has a 38.00 pack-year smoking history. he has never used smokeless tobacco. He reports that he does not drink alcohol or use drugs.  Family History:  Family History  Problem Relation Age of Onset  . Other Sister        died after knee surgery  . Colon cancer Father   . Cancer Father        colon     Prior to Admission medications   Medication Sig Start Date End Date Taking? Authorizing Provider  albuterol (PROAIR HFA) 108 (90 BASE) MCG/ACT inhaler Inhale 2 puffs into  the lungs every 6 (six) hours as needed.      [provider]  aspirin 81 MG tablet Take 81 mg by mouth daily.      [provider]  beclomethasone (QVAR) 80 MCG/ACT inhaler Inhale 2 puffs into the lungs 2 (two) times daily. 09/26/10 10/26/10  Brand Males, MD  citalopram (CELEXA) 10 MG tablet Take 1 tablet (10 mg total) by mouth daily. 11/14/11   Biagio Borg, MD  clonazePAM (KLONOPIN) 0.5 MG tablet Take 0.5 mg by mouth 2  (two) times daily as needed.      [provider]  Dextromethorphan-Guaifenesin (CORICIDIN HBP CONGESTION/COUGH) 10-200 MG CAPS Take 1 tablet by mouth 6 (six) times daily. 11/13/11   Junius Creamer, NP  Multiple Vitamin (MULTIVITAMIN WITH MINERALS) TABS Take 1 tablet by mouth daily.    [provider]  simvastatin (ZOCOR) 40 MG tablet TAKE ONE TABLET BY MOUTH AT BEDTIME 01/28/13   Biagio Borg, MD    Physical Exam: Vitals:   08/17/17 0115 08/17/17 0130 08/17/17 0145 08/17/17 0200  BP: 135/83 125/77 125/75 121/62  Pulse: 68 73 72 71  Resp: (!) 27 19 (!) 21 16  Temp:      TempSrc:      SpO2: 95% 95% 95% 93%  Weight:      Height:       General: Not in acute distress HEENT:       Eyes: PERRL, EOMI, no scleral icterus.       ENT: No discharge from the ears and nose, no pharynx injection, no tonsillar enlargement.        Neck: No JVD, no bruit, no mass felt. Heme: No neck lymph node enlargement. Cardiac: S1/S2, RRR, No murmurs, No gallops or rubs. Respiratory: No rales, wheezing, rhonchi or rubs. GI: Soft, nondistended, nontender, no rebound pain, no organomegaly, BS present. GU: No hematuria Ext: No pitting leg edema bilaterally. 2+DP/PT pulse bilaterally. Musculoskeletal: has right ankle swelling and tenderness. Skin: No skin tear. Neuro: Alert, oriented X3, cranial nerves II-XII grossly intact, moves all extremities normally. Muscle strength 5/5 in all extremities, sensation to light touch intact. Brachial reflex 2+ bilaterally. Psych: Patient is not psychotic, no suicidal or hemocidal ideation.  Labs on Admission: I have personally reviewed following labs and imaging studies  CBC: Recent Labs  Lab 08/16/17 2338  WBC 12.5*  HGB 17.1*  HCT 52.3*  MCV 92.9  PLT 580   Basic Metabolic Panel: Recent Labs  Lab 08/16/17 2338  NA 141  K 4.2  CL 104  CO2 27  GLUCOSE 104*  BUN 23*  CREATININE 1.84*  CALCIUM 8.7*   GFR: Estimated Creatinine Clearance: 36.5  mL/min (A) (by C-G formula based on SCr of 1.84 mg/dL (H)). Liver Function Tests: No results for input(s): AST, ALT, ALKPHOS, BILITOT, PROT, ALBUMIN in the last 168 hours. No results for input(s): LIPASE, AMYLASE in the last 168 hours. No results for input(s): AMMONIA in the last 168 hours. Coagulation Profile: No results for input(s): INR, PROTIME in the last 168 hours. Cardiac Enzymes: No results for input(s): CKTOTAL, CKMB, CKMBINDEX, TROPONINI in the last 168 hours. BNP (last 3 results) No results for input(s): PROBNP in the last 8760 hours. HbA1C: No results for input(s): HGBA1C in the last 72 hours. CBG: Recent Labs  Lab 08/16/17 2342  GLUCAP 110*   Lipid Profile: No results for input(s): CHOL, HDL, LDLCALC, TRIG, CHOLHDL, LDLDIRECT in the last 72 hours. Thyroid Function Tests: No results for  input(s): TSH, T4TOTAL, FREET4, T3FREE, THYROIDAB in the last 72 hours. Anemia Panel: No results for input(s): VITAMINB12, FOLATE, FERRITIN, TIBC, IRON, RETICCTPCT in the last 72 hours. Urine analysis:    Component Value Date/Time   COLORURINE YELLOW 06/18/2009 [place] 06/18/2009 1347   LABSPEC >=1.030 06/18/2009 1347   PHURINE 5.0 06/18/2009 1347   GLUCOSEU NEGATIVE 06/18/2009 [place] 06/18/2009 1347   KETONESUR NEGATIVE 06/18/2009 1347   UROBILINOGEN 0.2 06/18/2009 1347   NITRITE NEGATIVE 06/18/2009 1347   LEUKOCYTESUR NEGATIVE 06/18/2009 1347   Sepsis Labs: @LABRCNTIP (procalcitonin:4,lacticidven:4) )No results found for this or any previous visit (from the past 240 hour(s)).   Radiological Exams on Admission: Dg Ankle Complete Right  Result Date: 08/17/2017 CLINICAL DATA:  Recent fall with right ankle pain, initial encounter EXAM: RIGHT ANKLE - COMPLETE 3+ VIEW COMPARISON:  None. FINDINGS: Considerable soft tissue swelling is noted laterally. There is an oblique fracture through the distal fibula identified without significant  displacement. No definitive tibial fracture is seen. Visualized portions of the foot are within normal limits. IMPRESSION: Oblique fracture through the distal fibula with soft tissue swelling. Electronically Signed   By: Inez Catalina M.D.   On: 08/17/2017 00:00     EKG: Independently reviewed. Atrial fibrillation with RVR, low voltage.   Assessment/Plan Principal Problem:   Fall Active Problems:   Depression with anxiety   Atrial fibrillation with RVR (HCC)   CKD (chronic kidney disease), stage III (HCC)   HLD (hyperlipidemia)   COPD (chronic obstructive pulmonary disease) (HCC)   Closed right ankle fracture   Leukocytosis   Fall and closed right ankle fracture: possibly due to A fib with RVR. No LOC. No focal neurologic findings on physical examination. Now has right ankle fracture. Orthopedic surgeon was consulted. Dr. Mardelle Matte will see pt in AM.  - will place on tele bed for obs - Pain control: percocet for pain - When necessary Zofran for nausea - Robaxin for muscle spasm - type and cross - INR/PTT - PT/OT when able to (not ordered now) - consult CM for [facility] - f/u CT-head  Leukocytosis: Likely due to stress-induced demargination. Patient does not have signs of infection. -Follow-up CBC -f/u UA and Bx  Atrial fibrillation with RVR: pt has hx of PAF, not on AC currently. He had HR up to 160, spontaneously converted into sinus rhythm now. CHA2DS2-VASc Score is 2, needs oral anticoagulation. I will f/u card's recommendation about long term oral AC. Heart rate is 70-90s now. Unclear about triggering factors. No CP or SOB. - cycle CE q6 x3 and repeat EKG in the am  - Risk factor stratification: will check FLP, UDS and A1C  - 2d echo - Inpatient non-urgent card consult order was put in Epic and message to [employer] was sent out.  Depression and anxiety: Stable, no suicidal or homicidal ideations. -Continue home medications: Celexa, Klonopin   CKD (chronic kidney disease),  stage III ([place]): Slightly worsening than baseline. Baseline Cre is 1.5-1.6, pt's Cre is 1.84, BUN 23 on admission. Likely due to prerenal secondary to dehydration - IVF: ns at 75 cc/h - Follow up renal function by BMP  HLD: -zocor  COPD (chronic obstructive pulmonary disease) ([place]): stable.  -prn albuterol nebs -continue home Qvar,  DVT ppx: SQ Heparin   Code Status: Full code Family Communication:  Yes, patient's wife at bed side Disposition Plan:  Anticipate discharge back to previous home environment Consults called: ortho, Dr. Mardelle Matte  Admission status: Obs / tele  Date of Service 08/17/2017    Ivor Costa [facility] Pager [phone]  If 7PM-7AM, please contact night-coverage [email or web address] Password [email or web address] 08/17/2017, 2:16 AM

## 2017-08-17 NOTE — Progress Notes (Signed)
ANTICOAGULATION CONSULT NOTE - Initial Consult  Pharmacy Consult for Eliquis Indication: atrial fibrillation  No Known Allergies  Patient Measurements: Height: 5\' 8"  (172.7 cm) Weight: 210 lb (95.3 kg) IBW/kg (Calculated) : 68.4  Assessment: 82 yo M presents on 3/11 with fall and R ankle pain. Has fall risk. CBC stable.  Goal of Therapy:  Monitor platelets by anticoagulation protocol: Yes   Plan:  Start Eliquis 2.5mg  PO BID Monitor CBC, s/s of bleed   Richard Stanley J 08/17/2017,4:17 PM

## 2017-08-17 NOTE — ED Notes (Signed)
Patient transported to echo ?

## 2017-08-17 NOTE — ED Provider Notes (Addendum)
[facility] EMERGENCY DEPARTMENT Provider Note   CSN: [id number] Arrival date & time: 08/16/17  2323     History   Chief Complaint Chief Complaint  Patient presents with  . Fall  . Ankle Pain    HPI Richard Stanley is a 82 y.o. male.  Patient presents for evaluation of ankle pain after a fall.  Patient reports that he stood up to walk, became acutely dizzy, lost his balance and fell to the ground.  He did not completely lose consciousness.  Patient complaining of pain of the right ankle, cannot put weight on it because of the pain.  He denies chest pain, palpitations, shortness of breath.  Patient noted to have a heart rate of 160 in triage.      Past Medical History:  Diagnosis Date  . Anxiety   . ANXIETY 05/13/2010  . Asthma 09/26/2010  . 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 COPD (chronic obstructive pulmonary disease) ([place])   . Depression   . DEPRESSION 06/22/2009  . ED (erectile dysfunction)   . 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 HLD (hyperlipidemia)   . Hyperlipidemia   . HYPERLIPIDEMIA 06/18/2009  . Nodule of right lung   . Other diseases of lung, not elsewhere classified 06/18/2009  . Rash 10/01/2010  . Thyroid nodule 10/01/2010    Patient Active Problem List   Diagnosis Date Noted  . COPD (chronic obstructive pulmonary disease) ([place]) 08/17/2017  . Fall 08/17/2017  . Closed right ankle fracture 08/17/2017  . Leukocytosis 08/17/2017  . HLD (hyperlipidemia)   . Preventative health care 08/27/2011  . CKD (chronic kidney disease), stage III ([place]) 08/27/2011  . Atrial fibrillation with RVR ([place]) 05/15/2011  . Thyroid nodule 10/01/2010  . Rash 10/01/2010  . Asthma 09/26/2010  . 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 ANXIETY 05/13/2010  . BACK PAIN 05/13/2010  . Depression with anxiety  06/22/2009  . HYPERLIPIDEMIA 06/18/2009  . 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 CARDIAC CATHETERIZATION  1998   normal per pt       Home Medications    Prior to Admission medications   Medication Sig Start Date End Date Taking? Authorizing Provider  albuterol (PROAIR HFA) 108 (90 BASE) MCG/ACT inhaler Inhale 2 puffs into the lungs every 6 (six) hours as needed.      [provider]  aspirin 81 MG tablet Take 81 mg by mouth daily.      [provider]  beclomethasone (QVAR) 80 MCG/ACT inhaler Inhale 2 puffs into the lungs 2 (two) times daily. 09/26/10 10/26/10  Brand Males, MD  citalopram (CELEXA) 10 MG tablet Take 1 tablet (10 mg total) by mouth daily. 11/14/11   Biagio Borg, MD  clonazePAM (KLONOPIN) 0.5 MG tablet Take 0.5 mg by mouth 2 (two) times daily as needed.      [provider]  Dextromethorphan-Guaifenesin (CORICIDIN HBP CONGESTION/COUGH) 10-200 MG CAPS Take 1 tablet by mouth 6 (six) times daily. 11/13/11   Junius Creamer, NP  Multiple Vitamin (MULTIVITAMIN WITH MINERALS) TABS Take 1 tablet by mouth daily.    [provider]  simvastatin (ZOCOR) 40 MG tablet TAKE ONE TABLET BY MOUTH AT BEDTIME 01/28/13   Biagio Borg, MD    Family History Family History  Problem Relation Age of Onset  .  Other Sister        died after knee surgery  . Colon cancer Father   . Cancer Father        colon    Social History Social History   Tobacco Use  . Smoking status: Former Smoker    Packs/day: 1.00    Years: 38.00    Pack years: 38.00    Types: Cigarettes    Last attempt to quit: 06/09/1989    Years since quitting: 28.2  . Smokeless tobacco: Never Used  . Tobacco comment: Also smoke pipe in 1969 to 1991, several times a day  Substance Use Topics  . Alcohol use: No  . Drug use: No     Allergies   Patient has no known allergies.   Review of Systems Review of Systems    Musculoskeletal: Positive for arthralgias.  Neurological: Positive for dizziness.  All other systems reviewed and are negative.    Physical Exam Updated Vital Signs BP 121/62   Pulse 71   Temp 98.5 F (36.9 C) (Oral)   Resp 16   Ht 5\' 8"  (1.727 m)   Wt 95.3 kg (210 lb)   SpO2 93%   BMI 31.93 kg/m   Physical Exam  Constitutional: He is oriented to person, place, and time. He appears well-developed and well-nourished. No distress.  HENT:  Head: Normocephalic and atraumatic.  Right Ear: Hearing normal.  Left Ear: Hearing normal.  Nose: Nose normal.  Mouth/Throat: Oropharynx is clear and moist and mucous membranes are normal.  Eyes: Conjunctivae and EOM are normal. Pupils are equal, round, and reactive to light.  Neck: Normal range of motion. Neck supple.  Cardiovascular: Regular rhythm, S1 normal and S2 normal. Exam reveals no gallop and no friction rub.  No murmur heard. Pulmonary/Chest: Effort normal and breath sounds normal. No respiratory distress. He exhibits no tenderness.  Abdominal: Soft. Normal appearance and bowel sounds are normal. There is no hepatosplenomegaly. There is no tenderness. There is no rebound, no guarding, no tenderness at McBurney's point and negative Murphy's sign. No hernia.  Musculoskeletal: Normal range of motion.       Right ankle: He exhibits swelling. Tenderness. Lateral malleolus tenderness found. Achilles tendon normal.  Neurological: He is alert and oriented to person, place, and time. He has normal strength. No cranial nerve deficit or sensory deficit. Coordination normal. GCS eye subscore is 4. GCS verbal subscore is 5. GCS motor subscore is 6.  Skin: Skin is warm, dry and intact. No rash noted. No cyanosis.  Psychiatric: He has a normal mood and affect. His speech is normal and behavior is normal. Thought content normal.  Nursing note and vitals reviewed.    ED Treatments / Results  Labs (all labs ordered are listed, but only abnormal  results are displayed) Labs Reviewed  BASIC METABOLIC PANEL - Abnormal; Notable for the following components:      Result Value   Glucose, Bld 104 (*)    BUN 23 (*)    Creatinine, Ser 1.84 (*)    Calcium 8.7 (*)    GFR calc non Af Amer 33 (*)    GFR calc Af Amer 38 (*)    All other components within normal limits  CBC - Abnormal; Notable for the following components:   WBC 12.5 (*)    Hemoglobin 17.1 (*)    HCT 52.3 (*)    All other components within normal limits  CBG MONITORING, ED - Abnormal; Notable for the following components:  Glucose-Capillary 110 (*)    All other components within normal limits  CULTURE, BLOOD (ROUTINE X 2)  CULTURE, BLOOD (ROUTINE X 2)  URINALYSIS, ROUTINE W REFLEX MICROSCOPIC  RAPID URINE DRUG SCREEN, HOSP PERFORMED  PROTIME-INR  APTT  HEMOGLOBIN A1C  LIPID PANEL  TROPONIN I  TROPONIN I  TROPONIN I  TSH  BRAIN NATRIURETIC PEPTIDE  BASIC METABOLIC PANEL  CBC  I-STAT TROPONIN, ED  TYPE AND SCREEN    EKG  EKG Interpretation  Date/Time:  Sunday August 16 2017 23:37:26 EDT Ventricular Rate:  160 PR Interval:    QRS Duration: 74 QT Interval:  296 QTC Calculation: 482 R Axis:   -84 Text Interpretation:  Atrial fibrillation with rapid ventricular response Left axis deviation Nonspecific ST and T wave abnormality Abnormal ECG Confirmed by Orpah Greek [phone]) on 08/17/2017 12:23:07 AM       Radiology Dg Ankle Complete Right  Result Date: 08/17/2017 CLINICAL DATA:  Recent fall with right ankle pain, initial encounter EXAM: RIGHT ANKLE - COMPLETE 3+ VIEW COMPARISON:  None. FINDINGS: Considerable soft tissue swelling is noted laterally. There is an oblique fracture through the distal fibula identified without significant displacement. No definitive tibial fracture is seen. Visualized portions of the foot are within normal limits. IMPRESSION: Oblique fracture through the distal fibula with soft tissue swelling. Electronically Signed    By: Inez Catalina M.D.   On: 08/17/2017 00:00    Procedures Procedures (including critical care time)  Medications Ordered in ED Medications  aspirin tablet 81 mg (not administered)  beclomethasone (QVAR) 80 MCG/ACT inhaler 2 puff (not administered)  citalopram (CELEXA) tablet 10 mg (not administered)  clonazePAM (KLONOPIN) tablet 0.5 mg (not administered)  Dextromethorphan-guaiFENesin 10-200 MG CAPS 1 tablet (not administered)  multivitamin with minerals tablet 1 tablet (not administered)  simvastatin (ZOCOR) tablet 40 mg (not administered)  oxyCODONE-acetaminophen (PERCOCET/ROXICET) 5-325 MG per tablet 1 tablet (not administered)  methocarbamol (ROBAXIN) tablet 500 mg (not administered)  0.9 %  sodium chloride infusion (not administered)  albuterol (PROVENTIL) (2.5 MG/3ML) 0.083% nebulizer solution 2.5 mg (not administered)  heparin injection 5,000 Units (not administered)  acetaminophen (TYLENOL) tablet 650 mg (not administered)    Or  acetaminophen (TYLENOL) suppository 650 mg (not administered)  ondansetron (ZOFRAN) tablet 4 mg (not administered)    Or  ondansetron (ZOFRAN) injection 4 mg (not administered)  polyethylene glycol (MIRALAX / GLYCOLAX) packet 17 g (not administered)  hydrALAZINE (APRESOLINE) injection 5 mg (not administered)  zolpidem (AMBIEN) tablet 5 mg (not administered)  diltiazem (CARDIZEM) tablet 30 mg (not administered)     Initial Impression / Assessment and Plan / ED Course  I have reviewed the triage vital signs and the nursing notes.  Pertinent labs & imaging results that were available during my care of the patient were reviewed by me and considered in my medical decision making (see chart for details).     Patient presented after a fall.  Patient reports that his fall was caused by acute onset of dizziness.  Upon arrival to the ER he was found to be in atrial fibrillation with a rapid ventricular response.  Looking through his records reveals that  he did have at least one previous episode of atrial fibrillation, dating back to 2013.  He is not currently on anticoagulation.  Patient did spontaneously convert into sinus rhythm here in the ER.  I am concerned that the dizziness was secondary to hypotensive episode from his atrial fibrillation, will recommend that he be observed  overnight for recurrent arrhythmia.  Ankle x-ray discussed with Dr. Mardelle Matte who has reviewed images.  He thinks that this might be a nonoperative approach based on the patient's age.  He is fine with anticoagulation if necessary from a cardiac standpoint.  Will place in a stirrup splint.  CHA2DS2/VAS Stroke Risk Points      3 >= 2 Points: High Risk  1 - 1.99 Points: Medium Risk  0 Points: Low Risk    The patient's score has not changed in the past year.:  No Change     Details    This score determines the patient's risk of having a stroke if the  patient has atrial fibrillation.       Points Metrics  0 Has Congestive Heart Failure:  No   0 Has Vascular Disease:  No   1 Has Hypertension:  Yes    2 Age:  18   0 Has Diabetes:  No   0 Had Stroke:  No  Had TIA:  No  Had thromboembolism:  No   0 Male:  No             Final Clinical Impressions(s) / ED Diagnoses   Final diagnoses:  Atrial fibrillation with RVR (HCC)  Closed fracture of distal end of right fibula, unspecified fracture morphology, initial encounter    ED Discharge Orders    None       Marquez Ceesay, Gwenyth Allegra, MD 08/17/17 0210    Orpah Greek, MD 08/17/17 [phone]

## 2017-08-17 NOTE — ED Notes (Signed)
Admit dr  Eliseo Squires into see pt, diet ordered

## 2017-08-17 NOTE — Evaluation (Signed)
Physical Therapy Evaluation Patient Details Name: Richard Stanley MRN: [id number] DOB: 1935-09-26 Today's Date: 08/17/2017   History of Present Illness  Pt is a 82 y/o male admitted secondary to fall at home. Imaging revelaed R distal fibula fx. CT of head negative for acute abnormality. PMH includes a fib, CKD 3, and COPD.   Clinical Impression  Pt admitted secondary to problem above with deficits below. Pt only able to tolerate short distance within the room and required min A for steadying with use of RW. Able to maintain weightbearing precautions. Feel pt is a high fall risk and will need post acute rehab prior to return home to ensure safe with mobility. Will continue to follow acutely to maximize functional mobility independence and safety.     Follow Up Recommendations SNF;Supervision/Assistance - 24 hour    Equipment Recommendations  Other (comment)(TBD pending progress )    Recommendations for Other Services OT consult     Precautions / Restrictions Precautions Precautions: Fall Precaution Comments: Fall at home prior to admission.  Required Braces or Orthoses: Other Brace/Splint Other Brace/Splint: Splinted RLE at ankle Restrictions Weight Bearing Restrictions: Yes RLE Weight Bearing: Touchdown weight bearing      Mobility  Bed Mobility Overal bed mobility: Needs Assistance Bed Mobility: Supine to Sit;Sit to Supine     Supine to sit: Min assist Sit to supine: Min guard   General bed mobility comments: Min A for trunk elevation and steadying during transfer to sitting. Min guard for safety for return to supine.   Transfers Overall transfer level: Needs assistance Equipment used: Rolling walker (2 wheeled) Transfers: Sit to/from Stand Sit to Stand: Min assist;From elevated surface         General transfer comment: Min A for lift assist and steadying from elevated surface. Verbal cues to maintain weightbearing status throughout.    Ambulation/Gait Ambulation/Gait assistance: Min assist Ambulation Distance (Feet): 3 Feet Assistive device: Rolling walker (2 wheeled) Gait Pattern/deviations: Step-to pattern Gait velocity: Decreased  Gait velocity interpretation: Below normal speed for age/gender General Gait Details: SHort gait distance within the room. Pt limited secondary to increased fatigue and SOB. Required min A for steadying assist throughout. Able to maintain weightbearing status on RLE appropriately.   Stairs            Wheelchair Mobility    Modified Rankin (Stroke Patients Only)       Balance Overall balance assessment: Needs assistance Sitting-balance support: No upper extremity supported;Feet supported Sitting balance-Leahy Scale: Fair     Standing balance support: Bilateral upper extremity supported;During functional activity Standing balance-Leahy Scale: Poor Standing balance comment: Reliant on BUE support and external support to maintain balance.                              Pertinent Vitals/Pain Pain Assessment: Faces Faces Pain Scale: Hurts little more Pain Location: RLE  Pain Descriptors / Indicators: Grimacing Pain Intervention(s): Limited activity within patient's tolerance;Monitored during session;Repositioned    Home Living Family/patient expects to be discharged to:: Private residence Living Arrangements: Spouse/significant other;Children Available Help at Discharge: Family;Available 24 hours/day Type of Home: House Home Access: Stairs to enter Entrance Stairs-Rails: Left;Right;Can reach both Entrance Stairs-Number of Steps: 4 Home Layout: Two level Home Equipment: Walker - 2 wheels      Prior Function Level of Independence: Independent               Hand Dominance  Extremity/Trunk Assessment   Upper Extremity Assessment Upper Extremity Assessment: Defer to OT evaluation    Lower Extremity Assessment Lower Extremity Assessment:  RLE deficits/detail;Generalized weakness RLE Deficits / Details: R ankle splinted.  RLE: Unable to fully assess due to immobilization    Cervical / Trunk Assessment Cervical / Trunk Assessment: Kyphotic  Communication   Communication: Expressive difficulties  Cognition Arousal/Alertness: Awake/alert Behavior During Therapy: WFL for tasks assessed/performed Overall Cognitive Status: No family/caregiver present to determine baseline cognitive functioning                                        General Comments      Exercises     Assessment/Plan    PT Assessment Patient needs continued PT services  PT Problem List Decreased strength;Decreased range of motion;Decreased activity tolerance;Decreased balance;Decreased mobility;Decreased coordination;Decreased knowledge of use of DME;Decreased knowledge of precautions;Pain       PT Treatment Interventions DME instruction;Gait training;Balance training;Stair training;Functional mobility training;Neuromuscular re-education;Patient/family education;Therapeutic exercise;Therapeutic activities    PT Goals (Current goals can be found in the Care Plan section)  Acute Rehab PT Goals Patient Stated Goal: to go home  PT Goal Formulation: With patient Time For Goal Achievement: 08/31/17 Potential to Achieve Goals: Fair    Frequency Min 3X/week   Barriers to discharge        Co-evaluation               AM-PAC PT "6 Clicks" Daily Activity  Outcome Measure Difficulty turning over in bed (including adjusting bedclothes, sheets and blankets)?: A Little Difficulty moving from lying on back to sitting on the side of the bed? : Unable Difficulty sitting down on and standing up from a chair with arms (e.g., wheelchair, bedside commode, etc,.)?: Unable Help needed moving to and from a bed to chair (including a wheelchair)?: A Little Help needed walking in hospital room?: A Lot Help needed climbing 3-5 steps with a railing?  : Total 6 Click Score: 11    End of Session Equipment Utilized During Treatment: Gait belt Activity Tolerance: Patient limited by fatigue Patient left: in bed;with call bell/phone within reach Nurse Communication: Mobility status PT Visit Diagnosis: Unsteadiness on feet (R26.81);History of falling (Z91.81);Other abnormalities of gait and mobility (R26.89);Difficulty in walking, not elsewhere classified (R26.2);Pain Pain - Right/Left: Right Pain - part of body: Leg    Time: 3532-9924 PT Time Calculation (min) (ACUTE ONLY): 17 min   Charges:   PT Evaluation $PT Eval Moderate Complexity: 1 Mod     PT G Codes:        Richard Stanley, PT, DPT  Acute Rehabilitation Services  Pager: [phone]   Richard Stanley 08/17/2017, 2:37 PM

## 2017-08-17 NOTE — Consult Note (Signed)
ORTHOPAEDIC CONSULTATION  REQUESTING PHYSICIAN: Geradine Girt, DO  Chief Complaint: Right ankle pain   HPI: Richard Stanley is a 82 y.o. male who complains of acute moderate right ankle pain after a twisting injury in the kitchen.  He apparently had atrial fibrillation with rapid ventricular response, had a syncopal episode, came into the hospital for evaluation.  He lives with a supportive family.  Pain is located laterally.  He denies pain medially.  Difficulty with ambulation.  Rest makes it better, movement makes it worse.  Past Medical History:  Diagnosis Date  . Anxiety   . ANXIETY 05/13/2010  . Asthma 09/26/2010  . 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 COPD (chronic obstructive pulmonary disease) ([place])   . Depression   . DEPRESSION 06/22/2009  . ED (erectile dysfunction)   . 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 HLD (hyperlipidemia)   . Hyperlipidemia   . HYPERLIPIDEMIA 06/18/2009  . Nodule of right lung   . Other diseases of lung, not elsewhere classified 06/18/2009  . Rash 10/01/2010  . Thyroid nodule 10/01/2010   Past Surgical History:  Procedure Laterality Date  . CARDIAC CATHETERIZATION  1998   normal per pt   Social History   Socioeconomic History  . Marital status: Married    Spouse name: None  . Number of children: 2  . Years of education: None  . Highest education level: None  Social Needs  . Financial resource strain: None  . Food insecurity - worry: None  . Food insecurity - inability: None  . Transportation needs - medical: None  . Transportation needs - non-medical: None  Occupational History  . Occupation: retired from [employer]  Tobacco Use  . Smoking status: Former Smoker    Packs/day: 1.00    Years: 38.00    Pack years: 38.00    Types: Cigarettes    Last attempt to quit: 06/09/1989    Years since quitting: 28.2  . Smokeless tobacco: Never Used  .  Tobacco comment: Also smoke pipe in 1969 to 1991, several times a day  Substance and Sexual Activity  . Alcohol use: No  . Drug use: No  . Sexual activity: None  Other Topics Concern  . None  Social History Narrative  . None   Family History  Problem Relation Age of Onset  . Other Sister        died after knee surgery  . Colon cancer Father   . Cancer Father        colon   No Known Allergies   Positive ROS: All other systems have been reviewed and were otherwise negative with the exception of those mentioned in the HPI and as above.  Physical Exam: General: Alert, no acute distress Cardiovascular: No pedal edema Respiratory: No cyanosis, no use of accessory musculature GI: No organomegaly, abdomen is soft and non-tender Skin: No lesions in the area of chief complaint Neurologic: Sensation intact distally Psychiatric: Patient is competent for consent with normal mood and affect Lymphatic: No axillary or cervical lymphadenopathy  MUSCULOSKELETAL: Right ankle has no pain medially.  He is currently in a posterior splint.  Lateral side does have positive pain to palpation.  Assessment: Principal Problem:   Fall Active Problems:   Depression with anxiety   Atrial fibrillation with RVR (HCC)   CKD (chronic kidney disease), stage III (HCC)   HLD (hyperlipidemia)   COPD (chronic obstructive pulmonary  disease) ([place])   Closed right ankle fracture   Leukocytosis   Right SER two minimally displaced distal fibula fracture with atrial fibrillation and rapid ventricular response  Plan: This is an acute severe injury, although I think his mortise and ligamentous stability looks intact.  Working on plan for nonsurgical management, he is okay to eat from my standpoint.  Okay to anticoagulate per the medical team.  Elevation, touch toe weightbearing right lower extremity, return to clinic with me in 1 week we will get another set of x-rays and likely transition into a cast.  Physical  therapy for gait training in the meantime, discharge home per the primary team once he is optimized from his cardiac standpoint.    Richard Bridge, MD Cell [phone]   08/17/2017 7:57 AM

## 2017-08-17 NOTE — ED Notes (Signed)
Ortho in to see pt

## 2017-08-17 NOTE — Progress Notes (Signed)
Orthopedic Tech Progress Note Patient Details:  Richard Stanley 11-Aug-1935 [id number]  Ortho Devices Type of Ortho Device: Post (short leg) splint Ortho Device/Splint Location: rle Ortho Device/Splint Interventions: Ordered, Application, Adjustment   Post Interventions Patient Tolerated: Well Instructions Provided: Care of device, Adjustment of device   [place], [place] 08/17/2017, 3:53 AM

## 2017-08-17 NOTE — ED Notes (Signed)
Pt reports he was walking tot he kitchen to get coffee when he got dizzy and fell. Pt denies LOC.

## 2017-08-17 NOTE — Progress Notes (Signed)
Discussed case with emergency room provider.  Patient with minimally displaced ankle fracture and atrial fibrillation with rapid ventricular response.  Okay to anticoagulate for his heart from my standpoint.  Splint, elevation, touch toe weightbearing, and I will have a discussion about options both surgical and nonsurgical with him tomorrow.  I suspect nonsurgical management will be my recommended course of action depending on the patient's activity level.  The mortise is still maintained, the fracture is minimally displaced, and he has significant risk factors when considering surgery.  If he did elect for surgery, this can be done on a semielective basis within the next 1-10 days.  Full consult to follow.

## 2017-08-17 NOTE — Consult Note (Signed)
Cardiology Consultation:   Patient ID: Richard Stanley; [id number]; 1935-11-24   Admit date: 08/16/2017 Date of Consult: 08/17/2017  Primary Care Provider: Chesley Noon, MD Primary Cardiologist: new Primary Electrophysiologist:  n/a   Patient Profile:   Richard Stanley is a 82 y.o. male with a hx of thyroid nodule, COPD, HTN, HLD, lung nodule, depression, asthma, CKD III, HOH, who is being seen today for the evaluation of CP and atrial fib at the request of Dr Eliseo Squires.  History of Present Illness:   Mr. Surgeon has been followed by Dr Jenny Reichmann.   Pt was in his USOH yesterday. He had a HA, otherwise felt as usual. He got up to get water and got weak. He fell down, was too weak to hold himself up. He was by himself at the time. Unclear if he lost consciousness. He was able to crawl to the couch, his wife found him there. In the ER, he was found to have fx R ankle.   Initial ECG in ER was atrial fib, HR 160. Pt spontaneously converted to SR. Mr Otting was not aware of the atrial fib. He has never had palpitations. He denies any new DOE, not SOB at rest. He occasionally gets orthostatic dizziness, not as severe as yesterday. Does not feel any different now than he did before he got up off the couch.  He has chronic DOE, after he walks the dogs, he has to lie down and rest. No recent change. He denies orthopnea or PND. Denies LE edema.   He never gets chest pain.    Past Medical History:  Diagnosis Date  . ANXIETY 05/13/2010  . Asthma 09/26/2010  . 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 CKD (chronic kidney disease), stage III ([place])   . COPD (chronic obstructive pulmonary disease) ([place])   . DEPRESSION 06/22/2009  . 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 HYPERLIPIDEMIA 06/18/2009  . Nodule of right lung   . Other diseases of lung, not elsewhere classified 06/18/2009  . Rash 10/01/2010  . Thyroid nodule 10/01/2010     Past Surgical History:  Procedure Laterality Date  . CARDIAC CATHETERIZATION  1998   normal per pt     Prior to Admission medications   Medication Sig Start Date End Date Taking? Authorizing Provider  albuterol (PROAIR HFA) 108 (90 BASE) MCG/ACT inhaler Inhale 2 puffs into the lungs every 6 (six) hours as needed.     Yes [provider]  amLODipine (NORVASC) 5 MG tablet Take 5 mg by mouth daily.   Yes [provider]  aspirin 81 MG tablet Take 81 mg by mouth daily.     Yes [provider]  budesonide-formoterol (SYMBICORT) 160-4.5 MCG/ACT inhaler Inhale 2 puffs into the lungs 2 (two) times daily.   Yes [provider]  diclofenac (VOLTAREN) 75 MG EC tablet Take 75 mg by mouth 2 (two) times daily.   Yes [provider]  escitalopram (LEXAPRO) 20 MG tablet Take 20 mg by mouth daily.   Yes [provider]  HYDROcodone-acetaminophen (NORCO/VICODIN) 5-325 MG tablet Take 1 tablet by mouth every 6 (six) hours as needed for moderate pain.   Yes [provider]  Multiple Vitamin (MULTIVITAMIN WITH MINERALS) TABS Take 1 tablet by mouth daily.   Yes [provider]  simvastatin (ZOCOR) 40 MG tablet TAKE ONE TABLET BY MOUTH AT BEDTIME 01/28/13  Yes Biagio Borg, MD  beclomethasone (  QVAR) 80 MCG/ACT inhaler Inhale 2 puffs into the lungs 2 (two) times daily. 09/26/10 10/26/10  Brand Males, MD  citalopram (CELEXA) 10 MG tablet Take 1 tablet (10 mg total) by mouth daily. Patient not taking: Reported on 08/17/2017 11/14/11   Biagio Borg, MD  clonazePAM (KLONOPIN) 0.5 MG tablet Take 0.5 mg by mouth 2 (two) times daily as needed.      [provider]  Dextromethorphan-Guaifenesin (CORICIDIN HBP CONGESTION/COUGH) 10-200 MG CAPS Take 1 tablet by mouth 6 (six) times daily. Patient not taking: Reported on 08/17/2017 11/13/11   Junius Creamer, NP    Inpatient Medications: Scheduled Meds: . aspirin  81 mg Oral Daily  . budesonide  (PULMICORT) nebulizer solution  0.5 mg Nebulization BID  . citalopram  10 mg Oral Daily  . heparin  5,000 Units Subcutaneous Q8H  . multivitamin with minerals  1 tablet Oral Daily  . simvastatin  40 mg Oral QHS   Continuous Infusions:  PRN Meds: acetaminophen **OR** acetaminophen, albuterol, clonazePAM, hydrALAZINE, methocarbamol, ondansetron **OR** ondansetron (ZOFRAN) IV, oxyCODONE-acetaminophen, polyethylene glycol, zolpidem  Allergies:   No Known Allergies  Social History:   Social History   Socioeconomic History  . Marital status: Married    Spouse name: Not on file  . Number of children: 2  . Years of education: Not on file  . Highest education level: Not on file  Social Needs  . Financial resource strain: Not on file  . Food insecurity - worry: Not on file  . Food insecurity - inability: Not on file  . Transportation needs - medical: Not on file  . Transportation needs - non-medical: Not on file  Occupational History  . Occupation: retired from [employer]  Tobacco Use  . Smoking status: Former Smoker    Packs/day: 1.00    Years: 38.00    Pack years: 38.00    Types: Cigarettes    Last attempt to quit: 06/09/1989    Years since quitting: 28.2  . Smokeless tobacco: Never Used  . Tobacco comment: Also smoke pipe in 1969 to 1991, several times a day  Substance and Sexual Activity  . Alcohol use: No  . Drug use: No  . Sexual activity: Not on file  Other Topics Concern  . Not on file  Social History Narrative   Pt lives with wife, son lives with them.    Family History:   Family History  Problem Relation Age of Onset  . Other Sister        died after knee surgery  . Colon cancer Father   . Cancer Father        colon   Family Status:  Family Status  Relation Name Status  . Sister  Deceased       died after knee surgury  . Father  (Not Specified)    ROS:  Please see the history of present illness.  All other ROS reviewed and negative.      Physical Exam/Data:   Vitals:   08/17/17 1200 08/17/17 1215 08/17/17 1300 08/17/17 1315  BP: (!) 145/84 (!) 122/106 (!) 141/82 (!) 149/76  Pulse: (!) 55 61 63 64  Resp: 18 (!) 23 (!) 24 (!) 24  Temp:      TempSrc:      SpO2: 92% 94% 93% 94%  Weight:      Height:        Intake/Output Summary (Last 24 hours) at 08/17/2017 1324 Last data filed at 08/17/2017 1310  Gross per 24 hour  Intake 500 ml  Output -  Net 500 ml   Filed Weights   08/16/17 2335  Weight: 210 lb (95.3 kg)   Body mass index is 31.93 kg/m.  General:  Well nourished, well developed, in no acute distress HEENT: normal Lymph: no adenopathy Neck: no JVD Endocrine:  No thryomegaly Vascular: No carotid bruits; 3/4 extremity pulses 2+, RLE foot bandaged, not disturbed, cap refill WNL Cardiac:  normal S1, S2; RRR; no murmur  Lungs:  clear to auscultation bilaterally, no wheezing, rhonchi or rales  Abd: soft, nontender, no hepatomegaly  Ext: no edema Musculoskeletal:  No deformities noted but RLE is splinted and bandaged, BUE strength normal and equal Skin: warm and dry  Neuro:  CNs 2-12 intact, no focal abnormalities noted Psych:  Normal affect   EKG:  The EKG was personally reviewed and demonstrates:  Atrial fib, HR 160 Telemetry:  Telemetry was personally reviewed and demonstrates:  SR, HR 60s  Relevant CV Studies:  ECHO: done, results pending  Laboratory Data:  Chemistry Recent Labs  Lab 08/16/17 2338 08/17/17 0940  NA 141 138  K 4.2 3.9  CL 104 105  CO2 27 25  GLUCOSE 104* 137*  BUN 23* 25*  CREATININE 1.84* 1.77*  CALCIUM 8.7* 8.2*  GFRNONAA 33* 35*  GFRAA 38* 40*  ANIONGAP 10 8    Lab Results  Component Value Date   ALT 25 08/27/2011   AST 22 08/27/2011   ALKPHOS 72 08/27/2011   BILITOT 0.6 08/27/2011   Hematology Recent Labs  Lab 08/16/17 2338 08/17/17 0940  WBC 12.5* 8.1  RBC 5.63 5.03  HGB 17.1* 15.1  HCT 52.3* 46.7  MCV 92.9 92.8  MCH 30.4 30.0  MCHC 32.7 32.3   RDW 12.9 12.9  PLT 230 164   Cardiac Enzymes Recent Labs  Lab 08/17/17 0242 08/17/17 0940  TROPONINI <0.03 <0.03    Recent Labs  Lab 08/16/17 2350  TROPIPOC 0.00    BNP Recent Labs  Lab 08/17/17 0242  BNP 277.1*    TSH:  Lab Results  Component Value Date   TSH 4.376 08/17/2017   Lipids: Lab Results  Component Value Date   CHOL 159 08/17/2017   HDL 40 (L) 08/17/2017   LDLCALC 77 08/17/2017   LDLDIRECT 159.8 06/18/2009   TRIG 209 (H) 08/17/2017   CHOLHDL 4.0 08/17/2017   HgbA1c: Lab Results  Component Value Date   HGBA1C 5.4 08/17/2017    Radiology/Studies:  Dg Ankle Complete Right  Result Date: 08/17/2017 CLINICAL DATA:  Recent fall with right ankle pain, initial encounter EXAM: RIGHT ANKLE - COMPLETE 3+ VIEW COMPARISON:  None. FINDINGS: Considerable soft tissue swelling is noted laterally. There is an oblique fracture through the distal fibula identified without significant displacement. No definitive tibial fracture is seen. Visualized portions of the foot are within normal limits. IMPRESSION: Oblique fracture through the distal fibula with soft tissue swelling. Electronically Signed   By: Inez Catalina M.D.   On: 08/17/2017 00:00   Ct Head Wo Contrast  Result Date: 08/17/2017 CLINICAL DATA:  82 year old male with fall. EXAM: CT HEAD WITHOUT CONTRAST TECHNIQUE: Contiguous axial images were obtained from the base of the skull through the vertex without intravenous contrast. COMPARISON:  None. FINDINGS: Brain: There is mild age-related atrophy and chronic microvascular ischemic changes. There is no acute intracranial hemorrhage. No mass effect or midline shift. No extra-axial fluid collection. Vascular: No hyperdense vessel or unexpected calcification. Skull: Normal. Negative for  fracture or focal lesion. Sinuses/Orbits: No acute finding. Other: None IMPRESSION: 1. No acute intracranial hemorrhage. 2. Mild age-related atrophy and chronic microvascular ischemic  changes. Electronically Signed   By: Anner Crete M.D.   On: 08/17/2017 03:45    Assessment and Plan:   Principal Problem: 1.  Fall - per IM/Ortho, possibly med mgt, no surgery immediately planned - per Dr Mardelle Matte, ok to anticoagulate  Active Problems: 2.  Depression with anxiety - per IM  3.  Atrial fibrillation with RVR ([place]) - new dx, pt unaware of the arrhythmia, but this probably led to near-syncope - currently SR - would add Cardizem CD 120 mg qd, believe BP will tolerate. - keep on telemetry while here. - TSH normal - CHA2DS2VASc= 3 (age x 2, HTN) - if fall risk is moderate, would anticoag w/ Eliquis per pharmacy, dose likely 2.5 mg bid - f/u on echo results.  Otherwise, per IM   CKD (chronic kidney disease), stage III ([place])   HLD (hyperlipidemia)   COPD (chronic obstructive pulmonary disease) ([facility])   Closed right ankle fracture   Leukocytosis   For questions or updates, please contact [facility] Please consult [email or web address] for contact info under Cardiology/STEMI.   SignedRosaria Ferries, PA-C  08/17/2017 1:24 PM

## 2017-08-17 NOTE — Progress Notes (Signed)
  Echocardiogram 2D Echocardiogram has been performed.  Jennette Dubin 08/17/2017, 12:46 PM

## 2017-08-17 NOTE — Progress Notes (Addendum)
Patient admitted after midnight, please see H&P.  Here with episode of a fib with fall and subsequent ankle fracture.  Seen by Dr. Mardelle Matte-- touch toe weight bearing.  PT eval.  Follow up with ortho outpatient.   Pending: echo, PT eval, cardiology consult placed by admitting MD- defer anticoagulation to cards.  Eulogio Bear DO

## 2017-08-17 NOTE — ED Notes (Signed)
Pt up to void standing at bedside

## 2017-08-18 ENCOUNTER — Other Ambulatory Visit (HOSPITAL_COMMUNITY): Payer: Medicare HMO

## 2017-08-18 ENCOUNTER — Other Ambulatory Visit: Payer: Self-pay | Admitting: Physician Assistant

## 2017-08-18 DIAGNOSIS — W19XXXA Unspecified fall, initial encounter: Secondary | ICD-10-CM | POA: Diagnosis not present

## 2017-08-18 DIAGNOSIS — R55 Syncope and collapse: Secondary | ICD-10-CM

## 2017-08-18 DIAGNOSIS — I4891 Unspecified atrial fibrillation: Secondary | ICD-10-CM | POA: Diagnosis not present

## 2017-08-18 DIAGNOSIS — S82831A Other fracture of upper and lower end of right fibula, initial encounter for closed fracture: Secondary | ICD-10-CM | POA: Diagnosis not present

## 2017-08-18 LAB — ECHOCARDIOGRAM COMPLETE
Height: 68 in
Weight: 3360 oz

## 2017-08-18 MED ORDER — APIXABAN 2.5 MG PO TABS: 2.5000 mg | ORAL_TABLET | Freq: Two times a day (BID) | ORAL | 0 refills | Status: DC

## 2017-08-18 MED ORDER — POLYETHYLENE GLYCOL 3350 17 G PO PACK: 17.0000 g | PACK | Freq: Every day | ORAL | 0 refills | Status: DC | PRN

## 2017-08-18 MED ORDER — DILTIAZEM HCL ER COATED BEADS 120 MG PO CP24: 120.0000 mg | ORAL_CAPSULE | Freq: Every day | ORAL | 0 refills | Status: DC

## 2017-08-18 MED ORDER — ATORVASTATIN CALCIUM 20 MG PO TABS: 20.0000 mg | ORAL_TABLET | Freq: Every day | ORAL | 0 refills | Status: DC

## 2017-08-18 NOTE — Discharge Summary (Signed)
Physician Discharge Summary  Richard Stanley TIW:[id number] DOB: 01-24-1936 DOA: 08/16/2017  PCP: Chesley Noon, MD  Admit date: 08/16/2017 Discharge date: 08/18/2017   Recommendations for Outpatient Follow-Up:   Patient and family refused SNF and home health as well as DME Per cardiology: outpt event monitor  Elevation, touch toe weightbearing right lower extremity, return to clinic with Dr. Mardelle Matte in 1 week for another set of x-rays and likely transition into a cast.   Discharge Diagnosis:   Principal Problem:   Fall Active Problems:   Depression with anxiety   Atrial fibrillation with RVR ([place])   CKD (chronic kidney disease), stage III ([place])   HLD (hyperlipidemia)   COPD (chronic obstructive pulmonary disease) ([place])   Closed right ankle fracture   Leukocytosis   Discharge disposition:  Home  Discharge Condition: Improved.  Diet recommendation: Low sodium, heart healthy.  Wound care: None.   History of Present Illness:   Richard Stanley is a 82 y.o. male with medical history significant of hyperlipidemia, COPD, asthma, PAF not on anticoagulants, depression with anxiety, CKD-3, who presents with fall and right ankle pain.  Pt states that went to kitchen to get coffee, and suddenly felt dizzy and both his feet "gaveway" and he fell. Patient did not have LOC. Denies unilateral weakness, numbness or tingling in extremities. No facial droop, slurred speech. No vision change or hearing loss. Patient denies chest pain, SOB, cough. No fever or chills. Denies nausea, vomiting, diarrhea, abdominal pain, symptoms of UTI. Patient complains of right ankle swelling and moderate pain after fall.  The pain is is constant, moderate, nonradiating, sharp. Patient was found to have atrial fibrillation with RVR, with heart rate up 160 initially in ED, then converted to sinus rhythm spontaneously. Currently heart rate is 70-90s.     Hospital Course by Problem:   Atrial  fibrillation eliquis 2.5 BID -cardizem -outpatient cardiology follow up including event monitor Echo- normal EF with mild RAE  Ankle fracture -outpatient follow up with Dr. Mardelle Matte -touch toe weight bearing -per ortho:  This is an acute severe injury, although I think his mortise and ligamentous stability looks intact.  Working on plan for nonsurgical management, he is okay to eat from my standpoint.  Okay to anticoagulate per the medical team.  Elevation, touch toe weightbearing right lower extremity, return to clinic with me in 1 week we will get another set of x-rays and likely transition into a cast.  Physical therapy for gait training in the meantime, discharge home per the primary team once he is optimized from his cardiac standpoint     Medical Consultants:    Cards  ortho   Discharge Exam:   Vitals:   08/18/17 0814 08/18/17 0939  BP:  (!) 150/80  Pulse: 75 78  Resp: 20 20  Temp:  97.7 F (36.5 C)  SpO2: 97% 93%   Vitals:   08/17/17 2040 08/18/17 0534 08/18/17 0814 08/18/17 0939  BP:  130/74  (!) 150/80  Pulse: 60 79 75 78  Resp: 18 20 20 20   Temp:  98.9 F (37.2 C)  97.7 F (36.5 C)  TempSrc:  Oral  Oral  SpO2:  98% 97% 93%  Weight:      Height:        Gen:  NAD    The results of significant diagnostics from this hospitalization (including imaging, microbiology, ancillary and laboratory) are listed below for reference.     Procedures and Diagnostic Studies:  Dg Ankle Complete Right  Result Date: 08/17/2017 CLINICAL DATA:  Recent fall with right ankle pain, initial encounter EXAM: RIGHT ANKLE - COMPLETE 3+ VIEW COMPARISON:  None. FINDINGS: Considerable soft tissue swelling is noted laterally. There is an oblique fracture through the distal fibula identified without significant displacement. No definitive tibial fracture is seen. Visualized portions of the foot are within normal limits. IMPRESSION: Oblique fracture through the distal fibula with soft  tissue swelling. Electronically Signed   By: Inez Catalina M.D.   On: 08/17/2017 00:00   Ct Head Wo Contrast  Result Date: 08/17/2017 CLINICAL DATA:  82 year old male with fall. EXAM: CT HEAD WITHOUT CONTRAST TECHNIQUE: Contiguous axial images were obtained from the base of the skull through the vertex without intravenous contrast. COMPARISON:  None. FINDINGS: Brain: There is mild age-related atrophy and chronic microvascular ischemic changes. There is no acute intracranial hemorrhage. No mass effect or midline shift. No extra-axial fluid collection. Vascular: No hyperdense vessel or unexpected calcification. Skull: Normal. Negative for fracture or focal lesion. Sinuses/Orbits: No acute finding. Other: None IMPRESSION: 1. No acute intracranial hemorrhage. 2. Mild age-related atrophy and chronic microvascular ischemic changes. Electronically Signed   By: Anner Crete M.D.   On: 08/17/2017 03:45     Labs:   Basic Metabolic Panel: Recent Labs  Lab 08/16/17 2338 08/17/17 0940  NA 141 138  K 4.2 3.9  CL 104 105  CO2 27 25  GLUCOSE 104* 137*  BUN 23* 25*  CREATININE 1.84* 1.77*  CALCIUM 8.7* 8.2*   GFR Estimated Creatinine Clearance: 37.9 mL/min (A) (by C-G formula based on SCr of 1.77 mg/dL (H)). Liver Function Tests: No results for input(s): AST, ALT, ALKPHOS, BILITOT, PROT, ALBUMIN in the last 168 hours. No results for input(s): LIPASE, AMYLASE in the last 168 hours. No results for input(s): AMMONIA in the last 168 hours. Coagulation profile Recent Labs  Lab 08/17/17 0242  INR 0.93    CBC: Recent Labs  Lab 08/16/17 2338 08/17/17 0940  WBC 12.5* 8.1  HGB 17.1* 15.1  HCT 52.3* 46.7  MCV 92.9 92.8  PLT 230 164   Cardiac Enzymes: Recent Labs  Lab 08/17/17 0242 08/17/17 0940 08/17/17 1415  TROPONINI <0.03 <0.03 <0.03   BNP: Invalid input(s): POCBNP CBG: Recent Labs  Lab 08/16/17 2342  GLUCAP 110*   D-Dimer No results for input(s): DDIMER in the last 72  hours. Hgb A1c Recent Labs    08/17/17 0940  HGBA1C 5.4   Lipid Profile Recent Labs    08/17/17 0242  CHOL 159  HDL 40*  LDLCALC 77  TRIG 209*  CHOLHDL 4.0   Thyroid function studies Recent Labs    08/17/17 0242  TSH 4.376   Anemia work up No results for input(s): VITAMINB12, FOLATE, FERRITIN, TIBC, IRON, RETICCTPCT in the last 72 hours. Microbiology Recent Results (from the past 240 hour(s))  Culture, blood (Routine X 2) w Reflex to ID Panel     Status: None (Preliminary result)   Collection Time: 08/17/17  2:36 AM  Result Value Ref Range Status   Specimen Description BLOOD LEFT HAND  Final   Special Requests   Final    BOTTLES DRAWN AEROBIC AND ANAEROBIC Blood Culture adequate volume   Culture   Final    NO GROWTH 1 DAY Performed at [facility], [place] [address] [place], [place] [address]    Report Status PENDING  Incomplete  Culture, blood (Routine X 2) w Reflex to ID Panel  Status: None (Preliminary result)   Collection Time: 08/17/17  2:42 AM  Result Value Ref Range Status   Specimen Description BLOOD RIGHT HAND  Final   Special Requests   Final    BOTTLES DRAWN AEROBIC AND ANAEROBIC Blood Culture adequate volume   Culture   Final    NO GROWTH 1 DAY Performed at [facility], [address], [place], [place] [address]    Report Status PENDING  Incomplete     Discharge Instructions:   Discharge Instructions    Diet - low sodium heart healthy   Complete by:  As directed    Discharge instructions   Complete by:  As directed    Per cardiology: outpt event monitor  Elevation, touch toe weightbearing right lower extremity, return to clinic with Dr. Mardelle Matte in 1 week for another set of x-rays and likely transition into a cast.   Increase activity slowly   Complete by:  As directed      Allergies as of 08/18/2017   No Known Allergies     Medication List    STOP taking these medications   amLODipine 5 MG tablet Commonly known as:   NORVASC   beclomethasone 80 MCG/ACT inhaler Commonly known as:  QVAR   citalopram 10 MG tablet Commonly known as:  CELEXA   Dextromethorphan-guaiFENesin 10-200 MG Caps Commonly known as:  CORICIDIN HBP CONGESTION/COUGH   diclofenac 75 MG EC tablet Commonly known as:  VOLTAREN   simvastatin 40 MG tablet Commonly known as:  ZOCOR     TAKE these medications   apixaban 2.5 MG Tabs tablet Commonly known as:  ELIQUIS Take 1 tablet (2.5 mg total) by mouth 2 (two) times daily.   aspirin 81 MG tablet Take 81 mg by mouth daily.   atorvastatin 20 MG tablet Commonly known as:  LIPITOR Take 1 tablet (20 mg total) by mouth daily at 6 PM.   budesonide-formoterol 160-4.5 MCG/ACT inhaler Commonly known as:  SYMBICORT Inhale 2 puffs into the lungs 2 (two) times daily.   clonazePAM 0.5 MG tablet Commonly known as:  KLONOPIN Take 0.5 mg by mouth 2 (two) times daily as needed.   diltiazem 120 MG 24 hr capsule Commonly known as:  CARDIZEM CD Take 1 capsule (120 mg total) by mouth daily. Start taking on:  08/19/2017   escitalopram 20 MG tablet Commonly known as:  LEXAPRO Take 20 mg by mouth daily.   HYDROcodone-acetaminophen 5-325 MG tablet Commonly known as:  NORCO/VICODIN Take 1 tablet by mouth every 6 (six) hours as needed for moderate pain.   multivitamin with minerals Tabs tablet Take 1 tablet by mouth daily.   polyethylene glycol packet Commonly known as:  MIRALAX / GLYCOLAX Take 17 g by mouth daily as needed for mild constipation.   PROAIR HFA 108 (90 Base) MCG/ACT inhaler Generic drug:  albuterol Inhale 2 puffs into the lungs every 6 (six) hours as needed.            Durable Medical Equipment  (From admission, onward)        Start     Ordered   08/17/17 0801  For home use only DME 3 n 1  Once     08/17/17 0801   08/17/17 0801  For home use only DME Bedside commode  Once    Question:  Patient needs a bedside commode to treat with the following condition   Answer:  Closed right ankle fracture   08/17/17 0801  Follow-up Information    Marchia Bond, MD. Schedule an appointment as soon as possible for a visit in 1 week(s).   Specialty:  Orthopedic Surgery Contact information: [address] [place] [address] [phone]        Chesley Noon, MD. Call.   Specialty:  Family Medicine Contact information: [place] [address] [phone]        Sueanne Margarita, MD Follow up.   Specialty:  Cardiology Why:  call office for event monitor and follow up appointment Contact information: [address] [place] [address] [phone]            Time coordinating discharge: 35 min  Signed:  Geradine Girt   [facility] 08/18/2017, 2:01 PM

## 2017-08-18 NOTE — Care Management Note (Addendum)
Case Management Note  Patient Details  Name: Richard Stanley MRN: [id number] Date of Birth: [birth year]  Subjective/Objective:  History of recent fall (Rt fibula fx), Admitted for A-fib AVR               Action/Plan: Call placed to wife at [phone], patient information verified.  Prior to admission patient lived at home with wife.Will be returning to the same living situation after discharge.  Discussed recommendations of SNF/HH PT/OT.  Patient/Wife does not think patient needs at this time, their are concerns of cost.  Wife states she feels she/daughter can provide the care patient needs at home.  Also discussed recommendations of DME: 3-in-1, bedside commode. Notified attending Dr. Eliseo Squires of patient refusal of services. Home DME: walker-2 wheels. At discharge, patient has transportation home.  Patient has the ability to pay for  Medication/food.  Discussed new Prescription for Eliquis $118 co-pay; patient given free 30 day card for Eliquis and verbalized understanding.  Patient also states he already has a bedside commode at home.   Expected Discharge Date:     08/18/2017             Expected Discharge Plan:  Home/Self Care  In-House Referral:  Clinical Social Work  Discharge planning Services  CM Consult  Post Acute Care Choice:  Home Health Choice offered to:  Patient, Spouse  DME Arranged:   3-in1, bedside commode-Patient refused DME. DME Agency:   n/a  HH Arranged:  PT, OT, Refused SNF, Patient Refused [place] Agency:   n/a  Status of Service:  In process, will continue to follow  Kristen Cardinal, RN  Nurse case [place] 08/18/2017, 1:30 PM

## 2017-08-18 NOTE — Discharge Instructions (Signed)
Atrial Fibrillation Atrial fibrillation is a type of heartbeat that is irregular or fast (rapid). If you have this condition, your heart keeps quivering in a weird (chaotic) way. This condition can make it so your heart cannot pump blood normally. Having this condition gives a person more risk for stroke, heart failure, and other heart problems. There are different types of atrial fibrillation. Talk with your doctor to learn about the type that you have. Follow these instructions at home:  Take over-the-counter and prescription medicines only as told by your doctor.  If your doctor prescribed a blood-thinning medicine, take it exactly as told. Taking too much of it can cause bleeding. If you do not take enough of it, you will not have the protection that you need against stroke and other problems.  Do not use any tobacco products. These include cigarettes, chewing tobacco, and e-cigarettes. If you need help quitting, ask your doctor.  If you have apnea (obstructive sleep apnea), manage it as told by your doctor.  Do not drink alcohol.  Do not drink beverages that have caffeine. These include coffee, soda, and tea.  Maintain a healthy weight. Do not use diet pills unless your doctor says they are safe for you. Diet pills may make heart problems worse.  Follow diet instructions as told by your doctor.  Exercise regularly as told by your doctor.  Keep all follow-up visits as told by your doctor. This is important. Contact a doctor if:  You notice a change in the speed, rhythm, or strength of your heartbeat.  You are taking a blood-thinning medicine and you notice more bruising.  You get tired more easily when you move or exercise. Get help right away if:  You have pain in your chest or your belly (abdomen).  You have sweating or weakness.  You feel sick to your stomach (nauseous).  You notice blood in your throw up (vomit), poop (stool), or pee (urine).  You are short of  breath.  You suddenly have swollen feet and ankles.  You feel dizzy.  Your suddenly get weak or numb in your face, arms, or legs, especially if it happens on one side of your body.  You have trouble talking, trouble understanding, or both.  Your face or your eyelid droops on one side. These symptoms may be an emergency. Do not wait to see if the symptoms will go away. Get medical help right away. Call your local emergency services (911 in the [place] Do not drive yourself to the hospital. This information is not intended to replace advice given to you by your health care provider. Make sure you discuss any questions you have with your health care provider. Document Released: 03/04/2008 Document Revised: 11/01/2015 Document Reviewed: 09/20/2014 Elsevier Interactive Patient Education  2018 [employer]. Diet: As you were doing prior to hospitalization   Shower:  May shower but keep the splint dry, use an occlusive plastic wrap, NO SOAKING IN TUB.  If the bandage gets wet, change with a clean dry gauze.  If you have a splint on, leave the splint in place and keep the splint dry with a plastic bag.   Activity:  Increase activity slowly as tolerated, but follow the weight bearing instructions below.  The rules on driving is that you can not be taking narcotics while you drive, and you must feel in control of the vehicle.    Weight Bearing:   Touch toe weight bearing right leg.    To prevent constipation:  you may use a stool softener such as -  Colace (over the counter) 100 mg by mouth twice a day  Drink plenty of fluids (prune juice may be helpful) and high fiber foods Miralax (over the counter) for constipation as needed.    Itching:  If you experience itching with your medications, try taking only a single pain pill, or even half a pain pill at a time.  You may take up to 10 pain pills per day, and you can also use benadryl over the counter for itching or also to help with sleep.    Precautions:  If you experience chest pain or shortness of breath - call 911 immediately for transfer to the hospital emergency department!!  If you develop a fever greater that 101 F, purulent drainage from wound, increased redness or drainage from wound, or calf pain -- Call the office at [phone]                                                Follow- Up Appointment:  Please call for an appointment to be seen in 2 weeks [place] - [phone]

## 2017-08-18 NOTE — Evaluation (Signed)
Occupational Therapy Evaluation Patient Details Name: Richard Stanley MRN: [id number] DOB: 06-26-1935 Today's Date: 08/18/2017    History of Present Illness Pt is a 82 y/o male admitted secondary to fall at home. Imaging revelaed R distal fibula fx. CT of head negative for acute abnormality. PMH includes a fib, CKD 3, and COPD.    Clinical Impression   This 82 y/o M presents with the above. At baseline Pt is mod independent with ADLs and functional mobility using SPC. Pt presenting this session with generalized weakness, decreased dynamic balance and functional performance; Pt completed short distance functional mobility using RW with overall MinA, demonstrating good maintenance of TDWB during mobility completion; currently requires ModA for LB ADLs. Pt will benefit from continued acute OT services and will benefit from additional OT services in SNF setting prior to return home to maximize his overall safety and independence with ADLs and mobility.     Follow Up Recommendations  SNF;Supervision/Assistance - 24 hour    Equipment Recommendations  Other (comment)(TBD in next venue )           Precautions / Restrictions Precautions Precautions: Fall Precaution Comments: Fall at home prior to admission.  Required Braces or Orthoses: Other Brace/Splint Other Brace/Splint: Splinted RLE at ankle Restrictions Weight Bearing Restrictions: Yes RLE Weight Bearing: Touchdown weight bearing      Mobility Bed Mobility Overal bed mobility: Needs Assistance Bed Mobility: Supine to Sit     Supine to sit: Min guard     General bed mobility comments: MinGuard for safety; pt able to complete supine>sitting EOB from flat bed without physical assist   Transfers Overall transfer level: Needs assistance Equipment used: Rolling walker (2 wheeled) Transfers: Sit to/from Stand Sit to Stand: Min assist;From elevated surface         General transfer comment: MinA for lift assist and steadying  from slightly elevated bed surface. pt demonstrating safe hand placement and good maintenance of TDWB during transfer     Balance Overall balance assessment: Needs assistance Sitting-balance support: No upper extremity supported;Feet supported Sitting balance-Leahy Scale: Fair     Standing balance support: Bilateral upper extremity supported;During functional activity Standing balance-Leahy Scale: Poor Standing balance comment: Reliant on BUE support and external support to maintain balance.                            ADL either performed or assessed with clinical judgement   ADL Overall ADL's : Needs assistance/impaired Eating/Feeding: Modified independent;Sitting   Grooming: Set up;Sitting;Wash/dry hands   Upper Body Bathing: Min guard;Sitting   Lower Body Bathing: Minimal assistance;Sit to/from stand   Upper Body Dressing : Sitting;Min guard Upper Body Dressing Details (indicate cue type and reason): doffing/donning new gown  Lower Body Dressing: Moderate assistance;Sit to/from stand   Toilet Transfer: Minimal assistance;BSC;RW;Stand-pivot Armed forces technical officer Details (indicate cue type and reason): simulated in transfer EOB to recliner  Toileting- Clothing Manipulation and Hygiene: Moderate assistance;Sit to/from stand       Functional mobility during ADLs: Minimal assistance;Rolling walker General ADL Comments: pt completing bed mobility and able to perform functional mobility using RW approx 4' to transfer to recliner, good maintenance of TDWB in RLE      Pertinent Vitals/Pain Pain Assessment: Faces Faces Pain Scale: Hurts little more Pain Location: RLE  Pain Descriptors / Indicators: Grimacing Pain Intervention(s): Monitored during session;Repositioned          Extremity/Trunk Assessment Upper Extremity Assessment Upper Extremity  Assessment: Generalized weakness   Lower Extremity Assessment Lower Extremity Assessment: Defer to PT evaluation    Cervical / Trunk Assessment Cervical / Trunk Assessment: Kyphotic   Communication Communication Communication: Expressive difficulties   Cognition Arousal/Alertness: Awake/alert Behavior During Therapy: WFL for tasks assessed/performed Overall Cognitive Status: No family/caregiver present to determine baseline cognitive functioning                                 General Comments: Pt appears A&O, though noted to repeat same question asked during initial part of session and end of session.                     Home Living Family/patient expects to be discharged to:: Private residence Living Arrangements: Spouse/significant other;Children;Other relatives Available Help at Discharge: Family;Available 24 hours/day Type of Home: House Home Access: Stairs to enter [employer] of Steps: 4 Entrance Stairs-Rails: Left;Right;Can reach both Home Layout: Two level Alternate Level Stairs-Number of Steps: flight Alternate Level Stairs-Rails: Left Bathroom Shower/Tub: Teacher, early years/pre: Standard     Home Equipment: Environmental consultant - 2 wheels          Prior Functioning/Environment Level of Independence: Independent with assistive device(s)        Comments: reports using SPC for ambulation         OT Problem List: Decreased strength;Decreased activity tolerance;Impaired balance (sitting and/or standing);Decreased knowledge of use of DME or AE;Decreased knowledge of precautions      OT Treatment/Interventions: Self-care/ADL training;Therapeutic activities;DME and/or AE instruction;Balance training;Therapeutic exercise;Patient/family education    OT Goals(Current goals can be found in the care plan section) Acute Rehab OT Goals Patient Stated Goal: to go home  OT Goal Formulation: With patient Time For Goal Achievement: 09/01/17 Potential to Achieve Goals: Good  OT Frequency: Min 2X/week                             AM-PAC PT "6  Clicks" Daily Activity     Outcome Measure Help from another person eating meals?: None Help from another person taking care of personal grooming?: A Little Help from another person toileting, which includes using toliet, bedpan, or urinal?: A Lot Help from another person bathing (including washing, rinsing, drying)?: A Lot Help from another person to put on and taking off regular upper body clothing?: A Little Help from another person to put on and taking off regular lower body clothing?: A Lot 6 Click Score: 16   End of Session Equipment Utilized During Treatment: Gait belt;Rolling walker Nurse Communication: Mobility status  Activity Tolerance: Patient tolerated treatment well Patient left: in chair;with call bell/phone within reach;with chair alarm set  OT Visit Diagnosis: Unsteadiness on feet (R26.81);Muscle weakness (generalized) (M62.81)                Time: 2440-1027 OT Time Calculation (min): 23 min Charges:  OT General Charges $OT Visit: 1 Visit OT Evaluation $OT Eval Moderate Complexity: 1 Mod G-Codes:     Lou Cal, OT Pager [phone] 08/18/2017  Raymondo Band 08/18/2017, 9:15 AM

## 2017-08-18 NOTE — Progress Notes (Signed)
Attempted to call patient's wife to discuss MOON at his request, left message. Broeker,Susan Spouse [phone]

## 2017-08-18 NOTE — Progress Notes (Signed)
2D echo reviewed and showed Normal LVF with mild RAE.  No further cardiac workup needed.  He is now on Apixaban 2.5mg  BID and Cardizem CD 120mg  daily.  No other recs at this time.  Please have him followup in our office in a few weeks.  WIll sign off.  Call with any questions .

## 2017-08-18 NOTE — Progress Notes (Signed)
Richard Stanley to be D/C'd Home per MD order.  Discussed prescriptions and follow up appointments with the patient. Prescriptions given to patient, medication list explained in detail. Pt verbalized understanding.  Allergies as of 08/18/2017   No Known Allergies     Medication List    STOP taking these medications   amLODipine 5 MG tablet Commonly known as:  NORVASC   beclomethasone 80 MCG/ACT inhaler Commonly known as:  QVAR   citalopram 10 MG tablet Commonly known as:  CELEXA   Dextromethorphan-guaiFENesin 10-200 MG Caps Commonly known as:  CORICIDIN HBP CONGESTION/COUGH   diclofenac 75 MG EC tablet Commonly known as:  VOLTAREN   simvastatin 40 MG tablet Commonly known as:  ZOCOR     TAKE these medications   apixaban 2.5 MG Tabs tablet Commonly known as:  ELIQUIS Take 1 tablet (2.5 mg total) by mouth 2 (two) times daily.   aspirin 81 MG tablet Take 81 mg by mouth daily.   atorvastatin 20 MG tablet Commonly known as:  LIPITOR Take 1 tablet (20 mg total) by mouth daily at 6 PM.   budesonide-formoterol 160-4.5 MCG/ACT inhaler Commonly known as:  SYMBICORT Inhale 2 puffs into the lungs 2 (two) times daily.   clonazePAM 0.5 MG tablet Commonly known as:  KLONOPIN Take 0.5 mg by mouth 2 (two) times daily as needed.   diltiazem 120 MG 24 hr capsule Commonly known as:  CARDIZEM CD Take 1 capsule (120 mg total) by mouth daily. Start taking on:  08/19/2017   escitalopram 20 MG tablet Commonly known as:  LEXAPRO Take 20 mg by mouth daily.   HYDROcodone-acetaminophen 5-325 MG tablet Commonly known as:  NORCO/VICODIN Take 1 tablet by mouth every 6 (six) hours as needed for moderate pain.   multivitamin with minerals Tabs tablet Take 1 tablet by mouth daily.   polyethylene glycol packet Commonly known as:  MIRALAX / GLYCOLAX Take 17 g by mouth daily as needed for mild constipation.   PROAIR HFA 108 (90 Base) MCG/ACT inhaler Generic drug:  albuterol Inhale 2  puffs into the lungs every 6 (six) hours as needed.            Durable Medical Equipment  (From admission, onward)        Start     Ordered   08/17/17 0801  For home use only DME 3 n 1  Once     08/17/17 0801   08/17/17 0801  For home use only DME Bedside commode  Once    Question:  Patient needs a bedside commode to treat with the following condition  Answer:  Closed right ankle fracture   08/17/17 0801      Vitals:   08/18/17 0814 08/18/17 0939  BP:  (!) 150/80  Pulse: 75 78  Resp: 20 20  Temp:  97.7 F (36.5 C)  SpO2: 97% 93%    Skin clean, dry and intact without evidence of skin break down, no evidence of skin tears noted. IV catheter discontinued intact. Site without signs and symptoms of complications. Dressing and pressure applied. Pt denies pain at this time. No complaints noted.  An After Visit Summary was printed and given to the patient. Patient escorted via [place], and D/C home via private auto.  Dixie Dials RN, BSN

## 2017-08-18 NOTE — Care Management Obs Status (Signed)
[place] NOTIFICATION   Patient Details  Name: Richard Stanley MRN: [id number] Date of Birth: February 16, 1936   Medicare Observation Status Notification Given:  Yes    Carles Collet, RN 08/18/2017, 11:15 AM

## 2017-08-18 NOTE — Progress Notes (Signed)
BENEFIT CHECK  PER CMA: Memory Argue  # 17. S/W JOEL @ [employer] [facility] # [phone] OPT- 2   ELIQUIS  2.5 MG BID  COVER- YES  CO-PAY- $ 118.00  TIER- 3 DRUG  PRIOR APPROVAL- NO   DEDUCTIBLE : NOT MET   PREFERRED PHARMACY: [facility]

## 2017-08-22 LAB — CULTURE, BLOOD (ROUTINE X 2)
Culture: NO GROWTH
Culture: NO GROWTH
SPECIAL REQUESTS: ADEQUATE
SPECIAL REQUESTS: ADEQUATE

## 2017-09-03 ENCOUNTER — Other Ambulatory Visit: Payer: Self-pay | Admitting: Physician Assistant

## 2017-09-03 ENCOUNTER — Ambulatory Visit (INDEPENDENT_AMBULATORY_CARE_PROVIDER_SITE_OTHER): Payer: Medicare HMO

## 2017-09-03 DIAGNOSIS — R42 Dizziness and giddiness: Secondary | ICD-10-CM | POA: Diagnosis not present

## 2017-09-03 DIAGNOSIS — I4891 Unspecified atrial fibrillation: Secondary | ICD-10-CM

## 2018-02-22 ENCOUNTER — Other Ambulatory Visit: Payer: Self-pay

## 2018-02-22 ENCOUNTER — Emergency Department (HOSPITAL_COMMUNITY)
Admission: EM | Admit: 2018-02-22 | Discharge: 2018-02-22 | Disposition: A | Discharge status: Home / Self Care | Payer: Medicare HMO | Attending: Emergency Medicine | Admitting: Emergency Medicine

## 2018-02-22 ENCOUNTER — Encounter (HOSPITAL_COMMUNITY): Payer: Self-pay | Admitting: *Deleted

## 2018-02-22 DIAGNOSIS — R42 Dizziness and giddiness: Secondary | ICD-10-CM | POA: Diagnosis not present

## 2018-02-22 DIAGNOSIS — Z5321 Procedure and treatment not carried out due to patient leaving prior to being seen by health care provider: Secondary | ICD-10-CM | POA: Diagnosis not present

## 2018-02-22 LAB — BASIC METABOLIC PANEL
Anion gap: 10 (ref 5–15)
BUN: 18 mg/dL (ref 8–23)
CHLORIDE: 103 mmol/L (ref 98–111)
CO2: 28 mmol/L (ref 22–32)
Calcium: 9.2 mg/dL (ref 8.9–10.3)
Creatinine, Ser: 1.64 mg/dL — ABNORMAL HIGH (ref 0.61–1.24)
GFR calc non Af Amer: 38 mL/min — ABNORMAL LOW (ref >60)
GFR, EST AFRICAN AMERICAN: 44 mL/min — AB (ref >60)
Glucose, Bld: 112 mg/dL — ABNORMAL HIGH (ref 70–99)
POTASSIUM: 4.1 mmol/L (ref 3.5–5.1)
SODIUM: 141 mmol/L (ref 135–145)

## 2018-02-22 LAB — CBC
HEMATOCRIT: 50.5 % (ref 39.0–52.0)
Hemoglobin: 16.2 g/dL (ref 13.0–17.0)
MCH: 29.4 pg (ref 26.0–34.0)
MCHC: 32.1 g/dL (ref 30.0–36.0)
MCV: 91.7 fL (ref 78.0–100.0)
Platelets: 251 10*3/uL (ref 150–400)
RBC: 5.51 MIL/uL (ref 4.22–5.81)
RDW: 12.4 % (ref 11.5–15.5)
WBC: 11.4 10*3/uL — AB (ref 4.0–10.5)

## 2018-02-22 NOTE — ED Notes (Signed)
Called for Pt to recheck vitals. No answer 

## 2018-02-22 NOTE — ED Triage Notes (Addendum)
Pt in c/o feeling shaky that started this morning and dizziness that has been going on for months, denies pain, no distress noted- pt noted to have rash on his torso, reports they are flea bites from a new cat, denies tick bites

## 2018-02-22 NOTE — ED Notes (Signed)
Called to recheck vitals. No answer 

## 2018-02-22 NOTE — ED Notes (Signed)
Called for vitals, no answer. 3rd time.

## 2018-05-03 IMAGING — CR DG ANKLE COMPLETE 3+V*R*
3 series · 3 of 3 positions shown · non-contrast
Comparison: None.

CLINICAL DATA: Recent fall with right ankle pain, initial encounter

EXAM:
RIGHT ANKLE - COMPLETE 3+ VIEW

[ankle ap]
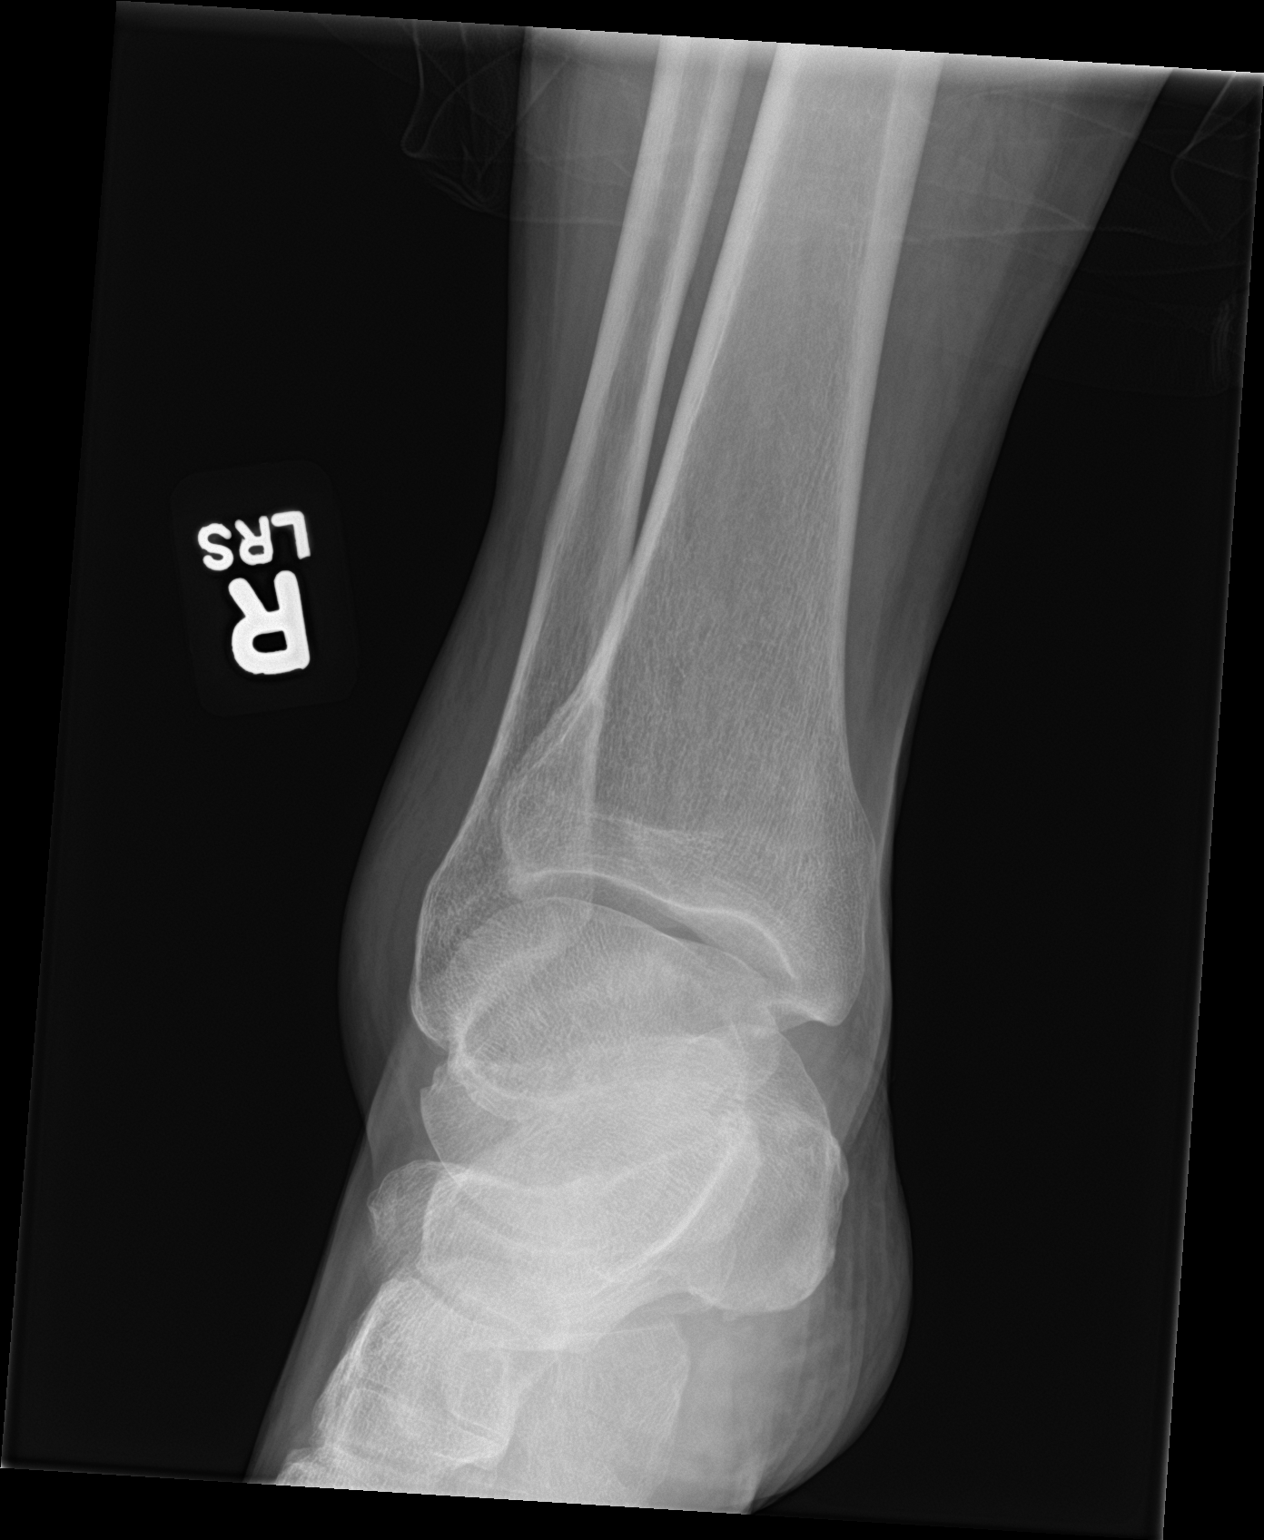

[ankle obl]
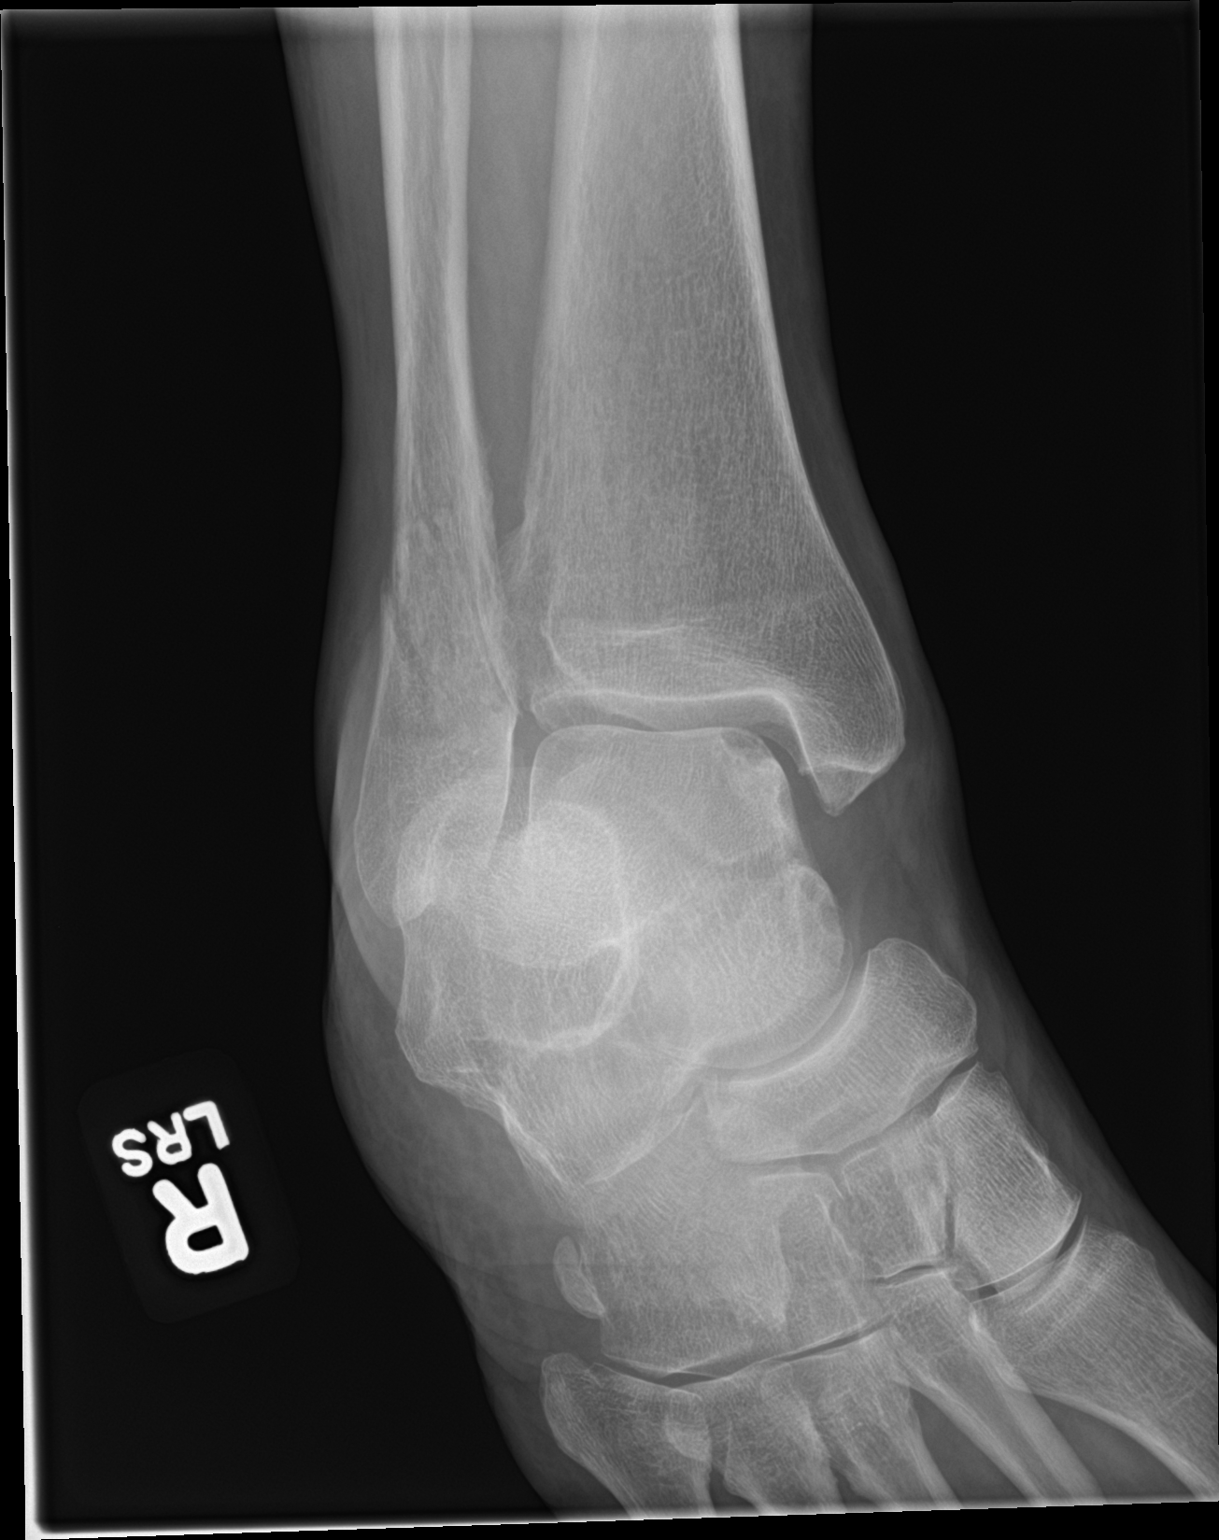

[ankle lat]
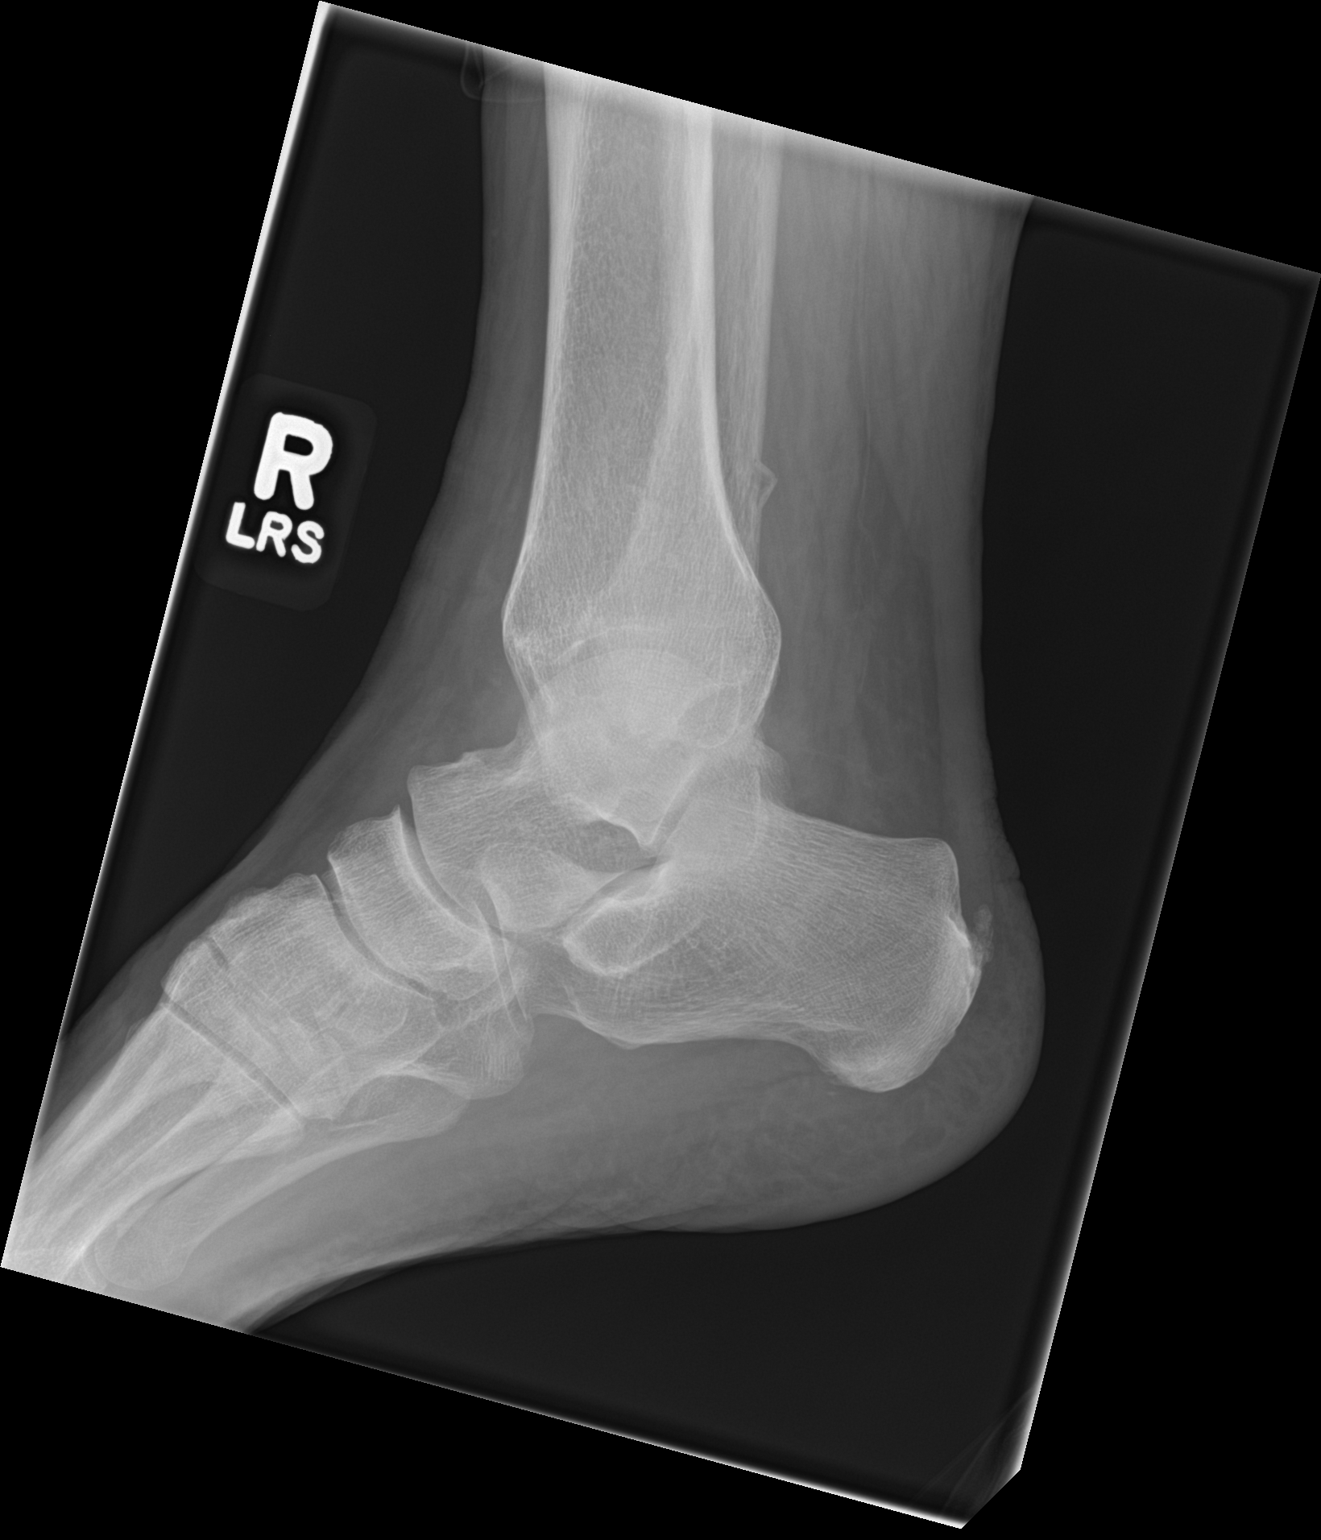

[3 of 3 positions shown; findings below may reference images not displayed]

FINDINGS: Considerable soft tissue swelling is noted laterally. There is an
oblique fracture through the distal fibula identified without
significant displacement. No definitive tibial fracture is seen.
Visualized portions of the foot are within normal limits.
IMPRESSION: Oblique fracture through the distal fibula with soft tissue
swelling.

## 2018-10-14 ENCOUNTER — Ambulatory Visit: Payer: Self-pay | Admitting: Cardiology

## 2020-02-10 ENCOUNTER — Other Ambulatory Visit: Payer: Self-pay

## 2020-02-10 ENCOUNTER — Ambulatory Visit: Payer: Medicare HMO | Admitting: Student

## 2020-02-10 ENCOUNTER — Encounter: Payer: Self-pay | Admitting: Student

## 2020-02-10 VITALS — BP 148/70 | HR 80 | Resp 17 | Ht 68.0 in | Wt 208.0 lb

## 2020-02-10 DIAGNOSIS — E782 Mixed hyperlipidemia: Secondary | ICD-10-CM

## 2020-02-10 DIAGNOSIS — I10 Essential (primary) hypertension: Secondary | ICD-10-CM

## 2020-02-10 DIAGNOSIS — R42 Dizziness and giddiness: Secondary | ICD-10-CM

## 2020-02-10 DIAGNOSIS — N1832 Chronic kidney disease, stage 3b: Secondary | ICD-10-CM

## 2020-02-10 DIAGNOSIS — I480 Paroxysmal atrial fibrillation: Secondary | ICD-10-CM

## 2020-02-10 MED ORDER — APIXABAN 2.5 MG PO TABS: 2.5000 mg | ORAL_TABLET | Freq: Two times a day (BID) | ORAL | 2 refills | Status: DC

## 2020-02-10 NOTE — Progress Notes (Signed)
Primary Physician/Referring:  Chesley Noon, MD  Patient ID: Richard Stanley, male    DOB: Aug 13, 1935, 84 y.o.   MRN: [id number]  Chief Complaint  Patient presents with  . Follow-up    1 year  . Atrial Fibrillation   HPI:    Richard Stanley  is a 84 y.o. male with history of hypertension, hyperlipidemia, asthma, chronic kidney disease stage III, COPD. He was found to have A. fib with RVR upon admission to the hospital in March 2019, he spontaneously self converted to normal sinus rhythm. He then followed up in the office and was found to have orthostasis and started on midodrine.  Patient was last seen 04/16/2018. He now presents for follow-up on paroxysmal atrial fibrillation and dizziness.  Since his last visit patient has stopped his diltiazem as well as his Eliquis and is presently not on any rhythm or rate control for A. Fib. Presently he is taking atorvastatin 20 mg, aspirin 81 mg.  He reports he still experiences dizziness when standing up from a seated position as well as occasionally while walking. He has not had any falls. He is no longer wearing support stockings on a regular basis.  Does report that he tries to stay hydrated as best he can.   Past Medical History:  Diagnosis Date  . ANXIETY 05/13/2010  . Asthma 09/26/2010  . Atrial fibrillation with RVR ([place]) 08/16/2017  . Benign essential HTN   . 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 CKD (chronic kidney disease), stage III   . COPD (chronic obstructive pulmonary disease) ([place])   . DEPRESSION 06/22/2009  . 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 HYPERLIPIDEMIA 06/18/2009  . Nodule of right lung   . Other diseases of lung, not elsewhere classified 06/18/2009  . Rash 10/01/2010  . Thyroid nodule 10/01/2010   Past Surgical History:  Procedure Laterality Date  . CARDIAC CATHETERIZATION  1998   normal per pt   Family History  Problem Relation Age  of Onset  . Other Sister        died after knee surgery  . Colon cancer Father   . Cancer Father        colon    Social History   Tobacco Use  . Smoking status: Former Smoker    Packs/day: 1.00    Years: 38.00    Pack years: 38.00    Types: Cigarettes    Quit date: 06/09/1989    Years since quitting: 30.6  . Smokeless tobacco: Never Used  . Tobacco comment: Also smoke pipe in 1969 to 1991, several times a day  Substance Use Topics  . Alcohol use: No   Marital Status: Married   ROS  Review of Systems  Cardiovascular: Negative for chest pain, dyspnea on exertion, leg swelling, palpitations, paroxysmal nocturnal dyspnea and syncope.  Respiratory: Positive for shortness of breath (chronic and stable).     Objective  Blood pressure (!) 148/70, pulse 80, resp. rate 17, height 5\' 8"  (1.727 m), weight 208 lb (94.3 kg), SpO2 91 %.  Vitals with BMI 02/10/2020 02/22/2018 02/22/2018  Height 5\' 8"  - -  Weight 208 lbs - -  BMI 41.96 - -  Systolic 222 979 892  Diastolic 70 119 95  Pulse 80 88 100   Orthostatic VS for the past 72 hrs (Last 3 readings):  Orthostatic BP Patient Position BP Location Cuff Size Orthostatic Pulse  02/10/20 1007  103/56 Standing Left Arm Normal 96  02/10/20 1006 143/78 Sitting Left Arm Normal 84  02/10/20 1005 116/61 Supine Left Arm Normal 79    Physical Exam Constitutional:      General: He is not in acute distress. Cardiovascular:     Rate and Rhythm: Normal rate and regular rhythm.     Pulses:          Carotid pulses are 2+ on the right side and 2+ on the left side.      Radial pulses are 2+ on the right side and 2+ on the left side.       Femoral pulses are 1+ on the right side and 1+ on the left side.      Popliteal pulses are 2+ on the right side and 2+ on the left side.       Dorsalis pedis pulses are 2+ on the right side and 2+ on the left side.       Posterior tibial pulses are 2+ on the right side and 2+ on the left side.     Heart sounds: S1  normal and S2 normal. Heart sounds are distant. No murmur heard.      Comments:  No edema. No JVD.  Pulmonary:     Effort: Pulmonary effort is normal. No respiratory distress.     Breath sounds: Wheezing (Bilaterally throughout ) present.  Neurological:     Mental Status: He is alert.    Laboratory examination:   No results for input(s): NA, K, CL, CO2, GLUCOSE, BUN, CREATININE, CALCIUM, GFRNONAA, GFRAA in the last 8760 hours. CrCl cannot be calculated (Patient's most recent lab result is older than the maximum 21 days allowed.).  CMP Latest Ref Rng & Units 02/22/2018 08/17/2017 08/16/2017  Glucose 70 - 99 mg/dL 112(H) 137(H) 104(H)  BUN 8 - 23 mg/dL 18 25(H) 23(H)  Creatinine 0.61 - 1.24 mg/dL 1.64(H) 1.77(H) 1.84(H)  Sodium 135 - 145 mmol/L 141 138 141  Potassium 3.5 - 5.1 mmol/L 4.1 3.9 4.2  Chloride 98 - 111 mmol/L 103 105 104  CO2 22 - 32 mmol/L 28 25 27   Calcium 8.9 - 10.3 mg/dL 9.2 8.2(L) 8.7(L)  Total Protein 6.0 - 8.3 g/dL - - -  Total Bilirubin 0.3 - 1.2 mg/dL - - -  Alkaline Phos 39 - 117 U/L - - -  AST 0 - 37 U/L - - -  ALT 0 - 53 U/L - - -   CBC Latest Ref Rng & Units 02/22/2018 08/17/2017 08/16/2017  WBC 4.0 - 10.5 K/uL 11.4(H) 8.1 12.5(H)  Hemoglobin 13.0 - 17.0 g/dL 16.2 15.1 17.1(H)  Hematocrit 39 - 52 % 50.5 46.7 52.3(H)  Platelets 150 - 400 K/uL 251 164 230    Lipid Panel No results for input(s): CHOL, TRIG, LDLCALC, VLDL, HDL, CHOLHDL, LDLDIRECT in the last 8760 hours.  HEMOGLOBIN A1C Lab Results  Component Value Date   HGBA1C 5.4 08/17/2017   MPG 108.28 08/17/2017   TSH No results for input(s): TSH in the last 8760 hours.   External Labs:  02/09/2020:  Glucose 126, BUN 19, creatinine 1.36, GFR 48, CMP otherwise within normal limits. CBC within normal limits Triglycerides 255, HDL 45, LDL 63, non-HDL 104, total 149 proBNP 169    Medications and allergies  No Known Allergies   Outpatient Medications Prior to Visit  Medication Sig Dispense  Refill  . albuterol (PROAIR HFA) 108 (90 BASE) MCG/ACT inhaler Inhale 2 puffs into the lungs every 6 (six)  hours as needed.      Marland Kitchen atorvastatin (LIPITOR) 20 MG tablet Take 1 tablet (20 mg total) by mouth daily at 6 PM. 30 tablet 0  . citalopram (CELEXA) 20 MG tablet Take 1 tablet by mouth daily.    . meclizine (ANTIVERT) 25 MG tablet Take 1 tablet by mouth every 8 (eight) hours as needed.    . Multiple Vitamin (MULTIVITAMIN WITH MINERALS) TABS Take 1 tablet by mouth daily.    . polyethylene glycol (MIRALAX / GLYCOLAX) packet Take 17 g by mouth daily as needed for mild constipation. 14 each 0  . aspirin 81 MG tablet Take 81 mg by mouth daily.      Marland Kitchen apixaban (ELIQUIS) 2.5 MG TABS tablet Take 1 tablet (2.5 mg total) by mouth 2 (two) times daily. (Patient not taking: Reported on 02/10/2020) 60 tablet 0  . budesonide-formoterol (SYMBICORT) 160-4.5 MCG/ACT inhaler Inhale 2 puffs into the lungs 2 (two) times daily. (Patient not taking: Reported on 02/10/2020)    . clonazePAM (KLONOPIN) 0.5 MG tablet Take 0.5 mg by mouth 2 (two) times daily as needed.   (Patient not taking: Reported on 02/10/2020)    . escitalopram (LEXAPRO) 20 MG tablet Take 20 mg by mouth daily. (Patient not taking: Reported on 02/10/2020)    . HYDROcodone-acetaminophen (NORCO/VICODIN) 5-325 MG tablet Take 1 tablet by mouth every 6 (six) hours as needed for moderate pain. (Patient not taking: Reported on 02/10/2020)    . diltiazem (CARDIZEM CD) 120 MG 24 hr capsule Take 1 capsule (120 mg total) by mouth daily. (Patient not taking: Reported on 02/10/2020) 30 capsule 0   No facility-administered medications prior to visit.     Radiology:   No results found.  Cardiac Studies:   Echocardiogram 08/17/2017:  Left ventricle: Cavity size was normal. Wall thickness was normal. Systolic function was normal. The estimated ejection fraction was in the range of 60% to 65%. Wall motion was normal; there were no regional wall abnormalities. Doppler  parameters are consistent with abnormal LV relaxation (grade 1 diastolic dysfunction). Right atrium: mildly dilate.   EKG EKG 02/10/2020: Normal sinus rhythm at a rate of 77 bpm, normal axis.  Nonspecific T wave abnormality in the anterior leads.    Assessment     ICD-10-CM   1. Paroxysmal atrial fibrillation (HCC)  I48.0 EKG 12-Lead  2. Stage 3b chronic kidney disease  N18.32   3. Essential hypertension  I10   4. Mixed hyperlipidemia  E78.2      Medications Discontinued During This Encounter  Medication Reason  . diltiazem (CARDIZEM CD) 120 MG 24 hr capsule Discontinued by provider  . aspirin 81 MG tablet Discontinued by provider    No orders of the defined types were placed in this encounter.  This patients CHA2DS2-VASc Score 3 (HTN, Age) and yearly risk of stroke 3.2%.   Recommendations:   Richard Stanley is a 84 y.o. male with history of hypertension, hyperlipidemia, asthma, chronic kidney disease stage III. He was found to have A. fib with RVR upon admission to the hospital in March 2019, he spontaneously self converted to normal sinus rhythm. He then followed up in the office and was found to have orthostasis and started on midodrine.  Patient presents in normal sinus rhythm today, however he does have a history of paroxysmal atrial fibrillation documented on EKG in March 2019 at the hospital. His chads vas score is 3. Discussed with patient and his wife today benefits versus risks of oral anticoagulation  at this time. Patient has had issues paying for Eliquis in the past, but is open to starting it again. Will initiate patient assistance application. In light of patient's stage III chronic kidney disease we will start him on Eliquis 2.5mg  2 times daily. Will stop aspirin at this time. We will also obtain an echocardiogram to evaluate heart structure and function in light of patient's hypertension, atrial fibrillation, and continued symptoms of dizziness.   Patient's orthostatic  vitals today did not reveal true orthostatic hypotension. Suspect dizziness will improve with conservative measures. Discussed with patient continuing conservative measures for management of dizziness including hydration and support stockings.   Of note patient's sitting blood pressure was elevated in the office today. Reports it is typically within normal limits. Encouraged him to monitor blood pressure at home and bring a log to next visit. Will not start antihypertensive medication at this time in view of chronic kidney disease and patient's willingness to monitor BP at home.   Patient follows with PCP for management of hyperlipidemia and reports recent blood work. Obtained and reviewed outside labs. Lipids are well controlled. Encouraged him to continue following with PCP for management of other comorbid conditions.   Will follow up in 4 weeks after echocardiogram   Total time spent 50 minutes of which greater than 50% of time spent face-to-face, complex decision making, and reviewing prior labs, EKG, echo, and records.   Patient was seen in collaboration with Dr. Virgina Jock. He reviewed patient's chart and is in agreement of the plan.   During this visit I reviewed and updated: Tobacco history  allergies medication reconciliation  medical history  surgical history  family history  social history.  This note was created using a voice recognition software as a result there may be grammatical errors inadvertently enclosed that do not reflect the nature of this encounter. Every attempt is made to correct such errors.   Alethia Berthold, PA-C 02/10/2020, 10:29 AM Office: [phone]

## 2020-03-07 ENCOUNTER — Other Ambulatory Visit: Payer: Self-pay

## 2020-03-07 ENCOUNTER — Ambulatory Visit: Payer: Medicare HMO

## 2020-03-07 DIAGNOSIS — I480 Paroxysmal atrial fibrillation: Secondary | ICD-10-CM

## 2020-03-07 DIAGNOSIS — I10 Essential (primary) hypertension: Secondary | ICD-10-CM

## 2020-03-07 DIAGNOSIS — E782 Mixed hyperlipidemia: Secondary | ICD-10-CM

## 2020-03-09 NOTE — Progress Notes (Deleted)
Primary Physician/Referring:  Chesley Noon, MD  Patient ID: Richard Stanley, male    DOB: 10/30/1935, 84 y.o.   MRN: [id number]  No chief complaint on file.  HPI:    Richard Stanley  is a 84 y.o. male with history of hypertension, hyperlipidemia, asthma, chronic kidney disease stage III, COPD. He was found to have A. fib with RVR upon admission to the hospital in March 2019, he spontaneously self converted to normal sinus rhythm. He then followed up in the office and was found to have orthostasis and started on midodrine.  Patient was last seen 04/16/2018. He now presents for follow-up on paroxysmal atrial fibrillation and dizziness.  Since his last visit patient has stopped his diltiazem as well as his Eliquis and is presently not on any rhythm or rate control for A. Fib. Presently he is taking atorvastatin 20 mg, aspirin 81 mg.  He reports he still experiences dizziness when standing up from a seated position as well as occasionally while walking. He has not had any falls. He is no longer wearing support stockings on a regular basis.  Does report that he tries to stay hydrated as best he can.  ***  Past Medical History:  Diagnosis Date  . ANXIETY 05/13/2010  . Asthma 09/26/2010  . Atrial fibrillation with RVR ([place]) 08/16/2017  . Benign essential HTN   . 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 CKD (chronic kidney disease), stage III   . COPD (chronic obstructive pulmonary disease) ([place])   . DEPRESSION 06/22/2009  . 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 HYPERLIPIDEMIA 06/18/2009  . Nodule of right lung   . Other diseases of lung, not elsewhere classified 06/18/2009  . Rash 10/01/2010  . Thyroid nodule 10/01/2010   Past Surgical History:  Procedure Laterality Date  . CARDIAC CATHETERIZATION  1998   normal per pt   Family History  Problem Relation Age of Onset  . Other Sister        died after knee surgery   . Colon cancer Father   . Cancer Father        colon    Social History   Tobacco Use  . Smoking status: Former Smoker    Packs/day: 1.00    Years: 38.00    Pack years: 38.00    Types: Cigarettes    Quit date: 06/09/1989    Years since quitting: 30.7  . Smokeless tobacco: Never Used  . Tobacco comment: Also smoke pipe in 1969 to 1991, several times a day  Substance Use Topics  . Alcohol use: No   Marital Status: Married   ROS  Review of Systems  Cardiovascular: Negative for chest pain, dyspnea on exertion, leg swelling, palpitations, paroxysmal nocturnal dyspnea and syncope.  Respiratory: Positive for shortness of breath (chronic and stable).     Objective  There were no vitals taken for this visit.  Vitals with BMI 02/10/2020 02/22/2018 02/22/2018  Height 5\' 8"  - -  Weight 208 lbs - -  BMI 78.46 - -  Systolic 962 952 841  Diastolic 70 324 95  Pulse 80 88 100   No data found.  Physical Exam Constitutional:      General: He is not in acute distress. Cardiovascular:     Rate and Rhythm: Normal rate and regular rhythm.     Pulses:          Carotid pulses are 2+ on  the right side and 2+ on the left side.      Radial pulses are 2+ on the right side and 2+ on the left side.       Femoral pulses are 1+ on the right side and 1+ on the left side.      Popliteal pulses are 2+ on the right side and 2+ on the left side.       Dorsalis pedis pulses are 2+ on the right side and 2+ on the left side.       Posterior tibial pulses are 2+ on the right side and 2+ on the left side.     Heart sounds: S1 normal and S2 normal. Heart sounds are distant. No murmur heard.      Comments:  No edema. No JVD.  Pulmonary:     Effort: Pulmonary effort is normal. No respiratory distress.     Breath sounds: Wheezing (Bilaterally throughout ) present.  Neurological:     Mental Status: He is alert.    Laboratory examination:   No results for input(s): NA, K, CL, CO2, GLUCOSE, BUN, CREATININE,  CALCIUM, GFRNONAA, GFRAA in the last 8760 hours. CrCl cannot be calculated (Patient's most recent lab result is older than the maximum 21 days allowed.).  CMP Latest Ref Rng & Units 02/22/2018 08/17/2017 08/16/2017  Glucose 70 - 99 mg/dL 112(H) 137(H) 104(H)  BUN 8 - 23 mg/dL 18 25(H) 23(H)  Creatinine 0.61 - 1.24 mg/dL 1.64(H) 1.77(H) 1.84(H)  Sodium 135 - 145 mmol/L 141 138 141  Potassium 3.5 - 5.1 mmol/L 4.1 3.9 4.2  Chloride 98 - 111 mmol/L 103 105 104  CO2 22 - 32 mmol/L 28 25 27   Calcium 8.9 - 10.3 mg/dL 9.2 8.2(L) 8.7(L)  Total Protein 6.0 - 8.3 g/dL - - -  Total Bilirubin 0.3 - 1.2 mg/dL - - -  Alkaline Phos 39 - 117 U/L - - -  AST 0 - 37 U/L - - -  ALT 0 - 53 U/L - - -   CBC Latest Ref Rng & Units 02/22/2018 08/17/2017 08/16/2017  WBC 4.0 - 10.5 K/uL 11.4(H) 8.1 12.5(H)  Hemoglobin 13.0 - 17.0 g/dL 16.2 15.1 17.1(H)  Hematocrit 39 - 52 % 50.5 46.7 52.3(H)  Platelets 150 - 400 K/uL 251 164 230    Lipid Panel No results for input(s): CHOL, TRIG, LDLCALC, VLDL, HDL, CHOLHDL, LDLDIRECT in the last 8760 hours.  HEMOGLOBIN A1C Lab Results  Component Value Date   HGBA1C 5.4 08/17/2017   MPG 108.28 08/17/2017   TSH No results for input(s): TSH in the last 8760 hours.   External Labs:  02/09/2020:  Glucose 126, BUN 19, creatinine 1.36, GFR 48, CMP otherwise within normal limits. CBC within normal limits Triglycerides 255, HDL 45, LDL 63, non-HDL 104, total 149 proBNP 169    Medications and allergies  No Known Allergies   Outpatient Medications Prior to Visit  Medication Sig Dispense Refill  . albuterol (PROAIR HFA) 108 (90 BASE) MCG/ACT inhaler Inhale 2 puffs into the lungs every 6 (six) hours as needed.      Marland Kitchen apixaban (ELIQUIS) 2.5 MG TABS tablet Take 1 tablet (2.5 mg total) by mouth 2 (two) times daily. 60 tablet 2  . atorvastatin (LIPITOR) 20 MG tablet Take 1 tablet (20 mg total) by mouth daily at 6 PM. 30 tablet 0  . budesonide-formoterol (SYMBICORT) 160-4.5  MCG/ACT inhaler Inhale 2 puffs into the lungs 2 (two) times daily. (Patient not taking: Reported on  02/10/2020)    . citalopram (CELEXA) 20 MG tablet Take 1 tablet by mouth daily.    . clonazePAM (KLONOPIN) 0.5 MG tablet Take 0.5 mg by mouth 2 (two) times daily as needed.   (Patient not taking: Reported on 02/10/2020)    . escitalopram (LEXAPRO) 20 MG tablet Take 20 mg by mouth daily. (Patient not taking: Reported on 02/10/2020)    . HYDROcodone-acetaminophen (NORCO/VICODIN) 5-325 MG tablet Take 1 tablet by mouth every 6 (six) hours as needed for moderate pain. (Patient not taking: Reported on 02/10/2020)    . meclizine (ANTIVERT) 25 MG tablet Take 1 tablet by mouth every 8 (eight) hours as needed.    . Multiple Vitamin (MULTIVITAMIN WITH MINERALS) TABS Take 1 tablet by mouth daily.    . polyethylene glycol (MIRALAX / GLYCOLAX) packet Take 17 g by mouth daily as needed for mild constipation. 14 each 0   No facility-administered medications prior to visit.     Radiology:   No results found.  Cardiac Studies:  Echocardiogram 03/07/2020: Normal LV systolic function with visual EF 55-60%. Left ventricle cavity is normal in size. Normal global wall motion. Normal diastolic filling pattern, normal LAP.  Mild (Grade I) aortic regurgitation. Compared to prior study dated 08/17/2017 no significant changes except, AR is new.   EKG EKG 02/10/2020: Normal sinus rhythm at a rate of 77 bpm, normal axis.  Nonspecific T wave abnormality in the anterior leads.    Assessment     ICD-10-CM   1. Paroxysmal atrial fibrillation (HCC)  I48.0   2. Essential hypertension  I10      There are no discontinued medications.  No orders of the defined types were placed in this encounter.  This patients CHA2DS2-VASc Score 3 (HTN, Age) and yearly risk of stroke 3.2%.   Recommendations:   Richard Stanley is a 84 y.o. male with history of hypertension, hyperlipidemia, asthma, chronic kidney disease stage III. He was  found to have A. fib with RVR upon admission to the hospital in March 2019, he spontaneously self converted to normal sinus rhythm. He then followed up in the office and was found to have orthostasis and started on midodrine.  Patient presents in normal sinus rhythm today, however he does have a history of paroxysmal atrial fibrillation documented on EKG in March 2019 at the hospital. His chads vas score is 3. Discussed with patient and his wife today benefits versus risks of oral anticoagulation at this time. Patient has had issues paying for Eliquis in the past, but is open to starting it again. Will initiate patient assistance application. In light of patient's stage III chronic kidney disease we will start him on Eliquis 2.5mg  2 times daily. Will stop aspirin at this time. We will also obtain an echocardiogram to evaluate heart structure and function in light of patient's hypertension, atrial fibrillation, and continued symptoms of dizziness.   Patient's orthostatic vitals today did not reveal true orthostatic hypotension. Suspect dizziness will improve with conservative measures. Discussed with patient continuing conservative measures for management of dizziness including hydration and support stockings.   Of note patient's sitting blood pressure was elevated in the office today. Reports it is typically within normal limits. Encouraged him to monitor blood pressure at home and bring a log to next visit. Will not start antihypertensive medication at this time in view of chronic kidney disease and patient's willingness to monitor BP at home.   Patient follows with PCP for management of hyperlipidemia and reports recent  blood work. Obtained and reviewed outside labs. Lipids are well controlled. Encouraged him to continue following with PCP for management of other comorbid conditions.   Will follow up in 4 weeks after echocardiogram   Total time spent 50 minutes of which greater than 50% of time spent  face-to-face, complex decision making, and reviewing prior labs, EKG, echo, and records.  *** New mild AR.   Patient was seen in collaboration with Dr. Virgina Jock. He reviewed patient's chart and is in agreement of the plan.   During this visit I reviewed and updated: Tobacco history  allergies medication reconciliation  medical history  surgical history  family history  social history.  This note was created using a voice recognition software as a result there may be grammatical errors inadvertently enclosed that do not reflect the nature of this encounter. Every attempt is made to correct such errors.   Alethia Berthold, PA-C 03/09/2020, 4:42 PM Office: [phone]

## 2020-03-12 ENCOUNTER — Ambulatory Visit: Payer: Medicare HMO | Admitting: Student

## 2020-03-12 NOTE — Progress Notes (Signed)
New mild AR, will discuss further at appt.

## 2021-08-30 ENCOUNTER — Ambulatory Visit: Payer: Medicare HMO | Admitting: Student

## 2021-08-30 ENCOUNTER — Encounter: Payer: Self-pay | Admitting: Student

## 2021-08-30 ENCOUNTER — Other Ambulatory Visit: Payer: Self-pay

## 2021-08-30 VITALS — BP 105/66 | HR 79 | Temp 97.6°F | Resp 17 | Ht 68.0 in | Wt 172.4 lb

## 2021-08-30 DIAGNOSIS — R9431 Abnormal electrocardiogram [ECG] [EKG]: Secondary | ICD-10-CM

## 2021-08-30 DIAGNOSIS — I480 Paroxysmal atrial fibrillation: Secondary | ICD-10-CM

## 2021-08-30 DIAGNOSIS — N1832 Chronic kidney disease, stage 3b: Secondary | ICD-10-CM

## 2021-08-30 DIAGNOSIS — I10 Essential (primary) hypertension: Secondary | ICD-10-CM

## 2021-08-30 MED ORDER — AMIODARONE HCL 200 MG PO TABS
ORAL_TABLET | ORAL | 3 refills | Status: DC
Start: 2021-08-30 — End: 2021-09-20

## 2021-08-30 NOTE — Progress Notes (Signed)
? ?Primary Physician/Referring:  [name], NP ? ?Patient ID: Richard Stanley, male    DOB: 1936-05-03, 86 y.o.   MRN: [id number] ? ?Chief Complaint  ?Patient presents with  ? New Patient (Initial Visit)  ? Atrial Fibrillation  ? ?HPI:   ? ?Richard Stanley  is a 86 y.o. male with history of hypertension, hyperlipidemia, asthma, chronic kidney disease stage III, COPD. He was found to have A. fib with RVR upon admission to the hospital in March 2019, he spontaneously self converted to normal sinus rhythm. He then followed up in the office and was found to have orthostasis and started on midodrine. ? ?Patient was seen in our office in 2019 and unfortunately lost to follow-up until 02/2020 at which time he presented to reestablish care given history of atrial fibrillation.  At last office visit added patient on Eliquis 2.5 mg daily given CKD and stopped aspirin.  Also ordered echocardiogram which revealed normal LVEF with mild aortic regurgitation.  Last office visit patient was not orthostatic, therefore advised him to stay well-hydrated and use support stockings given dizziness.  At that time patient was advised to follow-up in 4 weeks, unfortunately he was again lost to follow-up. ? ?Patient was subsequently admitted to [facility] 08/12/2021 - 08/18/2021 with atrial fibrillation with RVR.  Patient was rate controlled and discharged with Eliquis, diltiazem CD 240 mg daily, metoprolol succinate 100 g twice daily, as well as atorvastatin 20 mg daily.  During admission patient underwent echocardiogram, results of which are noted below.  ? ?Patient now presents to again reestablish care with our office.  Patient reports compliance with medications as prescribed at discharge including Toprol-XL, diltiazem, Xarelto, and atorvastatin.  He remains in atrial fibrillation, relatively well rate controlled.  However he continues to have episodes of dizziness while walking as well as dyspnea on exertion. ? ?Past  Medical History:  ?Diagnosis Date  ? ANXIETY 05/13/2010  ? Asthma 09/26/2010  ? Atrial fibrillation with RVR ([place]) 08/16/2017  ? Benign essential HTN   ? 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 CALCU GALLBLADD&BD W/O CHOLCYST W/O MENTION OBST 06/28/2010  ? CKD (chronic kidney disease), stage III ([place])   ? COPD (chronic obstructive pulmonary disease) ([place])   ? DEPRESSION 06/22/2009  ? 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 HYPERLIPIDEMIA 06/18/2009  ? Nodule of right lung   ? Other diseases of lung, not elsewhere classified 06/18/2009  ? Rash 10/01/2010  ? Thyroid nodule 10/01/2010  ? ?Past Surgical History:  ?Procedure Laterality Date  ? CARDIAC CATHETERIZATION  1998  ? normal per pt  ? ?Family History  ?Problem Relation Age of Onset  ? Colon cancer Father   ? Cancer Father   ?     colon  ? Other Sister   ?     died after knee surgery  ?  ?Social History  ? ?Tobacco Use  ? Smoking status: Former  ?  Packs/day: 1.00  ?  Years: 38.00  ?  Pack years: 38.00  ?  Types: Cigarettes  ?  Quit date: 06/09/1989  ?  Years since quitting: 32.2  ? Smokeless tobacco: Never  ? Tobacco comments:  ?  Also smoke pipe in 1969 to 1991, several times a day  ?Substance Use Topics  ? Alcohol use: No  ? ?Marital Status: Married  ? ?ROS  ?Review of Systems  ?Cardiovascular:  Positive for dyspnea on exertion. Negative for chest pain, leg swelling, palpitations, paroxysmal nocturnal dyspnea and  syncope.  ?Neurological:  Positive for dizziness.  ? ?Objective  ?Blood pressure 105/66, pulse 79, temperature 97.6 ?F (36.4 ?C), temperature source Temporal, resp. rate 17, height '5\' 8"'$  (1.727 m), weight 172 lb 6.4 oz (78.2 kg), SpO2 95 %.  ?Vitals with BMI 02/10/2020 02/22/2018 02/22/2018  ?Height '5\' 8"'$  - -  ?Weight 208 lbs - -  ?BMI 31.63 - -  ?Systolic 938 101 751  ?Diastolic 70 025 95  ?Pulse 80 88 100  ? ?Orthostatic VS for the past 72 hrs (Last 3 readings): ? Orthostatic BP Patient Position BP Location Cuff Size Orthostatic Pulse  ?08/30/21 1129  101/67 Standing Left Arm Normal 93  ?08/30/21 1128 112/66 Sitting Left Arm Normal 95  ?08/30/21 1127 112/74 Supine Left Arm Normal 92  ? ? Physical Exam ?Constitutional:   ?   General: He is not in acute distress. ?Cardiovascular:  ?   Rate and Rhythm: Normal rate. Rhythm irregular.  ?   Pulses:     ?     Carotid pulses are 2+ on the right side and 2+ on the left side. ?     Radial pulses are 2+ on the right side and 2+ on the left side.  ?     Femoral pulses are 1+ on the right side and 1+ on the left side. ?     Popliteal pulses are 2+ on the right side and 2+ on the left side.  ?     Dorsalis pedis pulses are 2+ on the right side and 2+ on the left side.  ?     Posterior tibial pulses are 2+ on the right side and 2+ on the left side.  ?   Heart sounds: S1 normal and S2 normal. Heart sounds are distant. No murmur heard. ?   Comments: No JVD.  ?Pulmonary:  ?   Effort: Pulmonary effort is normal. No respiratory distress.  ?   Breath sounds: Wheezing (Bilaterally throughout ) present.  ?Musculoskeletal:  ?   Right lower leg: No edema.  ?   Left lower leg: No edema.  ?Neurological:  ?   Mental Status: He is alert.  ? ?Laboratory examination:  ? ?No results for input(s): NA, K, CL, CO2, GLUCOSE, BUN, CREATININE, CALCIUM, GFRNONAA, GFRAA in the last 8760 hours. ?CrCl cannot be calculated (Patient's most recent lab result is older than the maximum 21 days allowed.).  ? ?  Latest Ref Rng & Units 02/22/2018  ?  3:30 PM 08/17/2017  ?  9:40 AM 08/16/2017  ? 11:38 PM  ?CMP  ?Glucose 70 - 99 mg/dL 112   137   104    ?BUN 8 - 23 mg/dL '18   25   23    '$ ?Creatinine 0.61 - 1.24 mg/dL 1.64   1.77   1.84    ?Sodium 135 - 145 mmol/L 141   138   141    ?Potassium 3.5 - 5.1 mmol/L 4.1   3.9   4.2    ?Chloride 98 - 111 mmol/L 103   105   104    ?CO2 22 - 32 mmol/L '28   25   27    '$ ?Calcium 8.9 - 10.3 mg/dL 9.2   8.2   8.7    ? ? ?  Latest Ref Rng & Units 02/22/2018  ?  3:30 PM 08/17/2017  ?  9:40 AM 08/16/2017  ? 11:38 PM  ?CBC  ?WBC 4.0 - 10.5  K/uL 11.4   8.1  12.5    ?Hemoglobin 13.0 - 17.0 g/dL 16.2   15.1   17.1    ?Hematocrit 39.0 - 52.0 % 50.5   46.7   52.3    ?Platelets 150 - 400 K/uL 251   164   230    ? ? ?Lipid Panel ?No results for input(s): CHOL, TRIG, LDLCALC, VLDL, HDL, CHOLHDL, LDLDIRECT in the last 8760 hours. ? ?HEMOGLOBIN A1C ?Lab Results  ?Component Value Date  ? HGBA1C 5.4 08/17/2017  ? MPG 108.28 08/17/2017  ? ?TSH ?No results for input(s): TSH in the last 8760 hours.  ? ?External Labs:  ?08/29/2021: ?Total cholesterol 143, triglycerides 117, HDL 52, LDL 70 ?BUN 39, creatinine 1.3, GFR 51, sodium 146, potassium 4.6 ?Hgb 16.4, HCT 40.8, MCV 89, platelet 319 ? ?08/16/2021: ?BNP 473 ?TSH 7.74 ? ?02/09/2020:  ?Glucose 126, BUN 19, creatinine 1.36, GFR 48, CMP otherwise within normal limits. ?CBC within normal limits ?Triglycerides 255, HDL 45, LDL 63, non-HDL 104, total 149 ?proBNP 169 ? ?Allergies  ?No Known Allergies  ? ?Medications Prior to Visit:  ? ?Outpatient Medications Prior to Visit  ?Medication Sig Dispense Refill  ? albuterol (VENTOLIN HFA) 108 (90 Base) MCG/ACT inhaler Inhale 2 puffs into the lungs every 6 (six) hours as needed.      ? atorvastatin (LIPITOR) 20 MG tablet Take 1 tablet (20 mg total) by mouth daily at 6 PM. 30 tablet 0  ? diltiazem (TIAZAC) 240 MG 24 hr capsule Take 1 capsule by mouth daily.    ? meclizine (ANTIVERT) 25 MG tablet Take 1 tablet by mouth every 8 (eight) hours as needed.    ? metoprolol succinate (TOPROL-XL) 100 MG 24 hr tablet Take 1 tablet by mouth daily.    ? Multiple Vitamin (MULTIVITAMIN WITH MINERALS) TABS Take 1 tablet by mouth daily.    ? Rivaroxaban (XARELTO) 15 MG TABS tablet Take 15 mg by mouth 2 (two) times daily with a meal.    ? apixaban (ELIQUIS) 2.5 MG TABS tablet Take 1 tablet by mouth daily.    ? atorvastatin (LIPITOR) 20 MG tablet Take 1 tablet by mouth daily.    ? Rivaroxaban (XARELTO) 15 MG TABS tablet Take 1 tablet by mouth daily.    ? Albuterol Sulfate (PROAIR RESPICLICK) 782  (90 Base) MCG/ACT AEPB Inhale 1 puff into the lungs.    ? apixaban (ELIQUIS) 2.5 MG TABS tablet Take 1 tablet (2.5 mg total) by mouth 2 (two) times daily. 60 tablet 2  ? budesonide-formoterol (SYMBICORT) 160

## 2021-09-11 ENCOUNTER — Other Ambulatory Visit: Payer: Medicare HMO

## 2021-09-18 ENCOUNTER — Telehealth: Payer: Self-pay

## 2021-09-18 DIAGNOSIS — N1832 Chronic kidney disease, stage 3b: Secondary | ICD-10-CM

## 2021-09-18 MED ORDER — METOPROLOL SUCCINATE ER 100 MG PO TB24: 100.0000 mg | ORAL_TABLET | Freq: Every day | ORAL | 3 refills | Status: DC

## 2021-09-18 NOTE — Telephone Encounter (Signed)
Called and spoke to pts wife, she voiced understanding and will have the pt go to [facility].

## 2021-09-18 NOTE — Telephone Encounter (Signed)
Pts wife called and stated that the pt is unable to pick up his prescription because the last time it got filled, they were in [place]. They would like to know if you could send his metoprolol to [facility] on [address]. Also, the pt has been having dark urine and they read that the amiodarone could cause this and it stated they should make their provider aware. They are unsure of what to do. Please advise. ?

## 2021-09-19 NOTE — Telephone Encounter (Signed)
Pts wife called back today and stated that the pt got his urinalysis done. She said that once they got home, the pt got very dizzy and she is concerned that this may be a side affect of the amiodarone as well. She is unsure of what to do and they do not have a BP cuff to check. Please advise.  ?

## 2021-09-19 NOTE — Telephone Encounter (Signed)
Called and spoke to pts wife, pt had more concerns about medications she believes may be interacting with one another. Transferred call to [facility].

## 2021-09-19 NOTE — Telephone Encounter (Addendum)
Reviewed and updated med list together while on the phone. Pt noted to be taking metoprolol succ 100 mg BID. Unable to check his HR or vitals at home. Denies any concerns of palpitations, SOB, CP, syncope-like episodes. Worsening lightheadedness and dizziness since this afternoon without significant improvement.  Resting currently and working on increasing his fluid and oral intake. ? ?Scheduled for an EKG and vitals checks tomorrow morning. Recommended pt hold his evening metoprolol for now. Can review amiodarone tolerance vs pt still having persistent Afib episodes based on tomorrow's evaluation.  ?

## 2021-09-19 NOTE — Telephone Encounter (Signed)
Continue to monitor symptoms. Patient should be taking amiodarone 200 mg once daily, please confirm. If able patient could stop at pharmacy or our office to check BP and HR. Will keep them posted on results but would not change medications at this time.

## 2021-09-20 ENCOUNTER — Ambulatory Visit: Payer: Medicare HMO | Admitting: Student

## 2021-09-20 ENCOUNTER — Telehealth: Payer: Self-pay | Admitting: Student

## 2021-09-20 VITALS — BP 118/62

## 2021-09-20 DIAGNOSIS — I480 Paroxysmal atrial fibrillation: Secondary | ICD-10-CM

## 2021-09-20 LAB — URINALYSIS, ROUTINE W REFLEX MICROSCOPIC
Bilirubin, UA: NEGATIVE
Glucose, UA: NEGATIVE
Ketones, UA: NEGATIVE
Leukocytes,UA: NEGATIVE
Nitrite, UA: NEGATIVE
Specific Gravity, UA: 1.022 (ref 1.005–1.030)
Urobilinogen, Ur: 1 mg/dL (ref 0.2–1.0)
pH, UA: 6 (ref 5.0–7.5)

## 2021-09-20 LAB — MICROSCOPIC EXAMINATION
Bacteria, UA: NONE SEEN
Casts: NONE SEEN /lpf
Epithelial Cells (non renal): >10 /hpf — AB (ref 0–10)
RBC, Urine: >30 /hpf — AB (ref 0–2)

## 2021-09-20 MED ORDER — AMIODARONE HCL 200 MG PO TABS
100.0000 mg | ORAL_TABLET | Freq: Every day | ORAL | 3 refills | Status: DC
Start: 2021-09-20 — End: 2021-09-23

## 2021-09-20 NOTE — Telephone Encounter (Signed)
Hi Richard Stanley not sure if you are done charting on patient but they called regarding a medication he needs to cut in half and requesting a refill please advise

## 2021-09-20 NOTE — Telephone Encounter (Signed)
Patient's wife says patient was told one of his medications (I think it is the amiodarine) is to be cut in half and take 100 MG, per CC at appointment today 09/20/21. Patient's wife asking if this can be called into the pharmacy today so they can pick it up.  ?

## 2021-09-23 ENCOUNTER — Other Ambulatory Visit: Payer: Self-pay | Admitting: Student

## 2021-09-23 MED ORDER — AMIODARONE HCL 100 MG PO TABS: 100.0000 mg | ORAL_TABLET | Freq: Every day | ORAL | 3 refills | Status: DC

## 2021-09-23 NOTE — Telephone Encounter (Signed)
100 mg once daily.

## 2021-09-23 NOTE — Telephone Encounter (Signed)
Pharmacy requesting clarification for how pt should be taking this medication.

## 2021-09-23 NOTE — Progress Notes (Signed)
? ?Primary Physician/Referring:  [name], NP ? ?Patient ID: Richard Stanley, male    DOB: 1935-10-18, 86 y.o.   MRN: [id number] ? ?Chief Complaint  ?Patient presents with  ? Abnormal ECG  ? Hypertension  ? ?HPI:   ? ?Richard Stanley  is a 86 y.o. male with history of hypertension, hyperlipidemia, asthma, chronic kidney disease stage III, COPD. He was found to have A. fib with RVR upon admission to the hospital in March 2019, he spontaneously self converted to normal sinus rhythm. He then followed up in the office and was found to have orthostasis and started on midodrine. ? ?Patient was seen in our office in 2019 and unfortunately lost to follow-up until 02/2020 at which time he presented to reestablish care given history of atrial fibrillation.  At that time started Eliquis 2.5 mg daily given CKD and stopped aspirin.  Also ordered echocardiogram which revealed normal LVEF with mild aortic regurgitation.  Patient was again lost to follow up until 08/30/2021. He had been admitted to [facility] 08/12/2021 - 08/18/2021 with atrial fibrillation with RVR.  Patient was rate controlled and discharged with Eliquis, diltiazem CD 240 mg daily, metoprolol succinate 100 g twice daily, as well as atorvastatin 20 mg daily. ? ?Patient presents for 4 week follow up. At last office visit started amiodarone and ordered stress test. Results of stress test are pending. His primary complaint today is that he continued to have episodes of dizziness and dyspnea on exertion which are unchanged compared to previous office visit. He continues to tolerate anticoagulation without bleeding diathesis, which he has now been on for >3 weeks.  ? ?Past Medical History:  ?Diagnosis Date  ? ANXIETY 05/13/2010  ? Asthma 09/26/2010  ? Atrial fibrillation with RVR ([place]) 08/16/2017  ? Benign essential HTN   ? 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 CALCU GALLBLADD&BD W/O CHOLCYST W/O MENTION OBST 06/28/2010  ? CKD (chronic kidney  disease), stage III ([place])   ? COPD (chronic obstructive pulmonary disease) ([place])   ? DEPRESSION 06/22/2009  ? 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 HYPERLIPIDEMIA 06/18/2009  ? Nodule of right lung   ? Other diseases of lung, not elsewhere classified 06/18/2009  ? Rash 10/01/2010  ? Thyroid nodule 10/01/2010  ? ?Past Surgical History:  ?Procedure Laterality Date  ? CARDIAC CATHETERIZATION  1998  ? normal per pt  ? ?Family History  ?Problem Relation Age of Onset  ? Colon cancer Father   ? Cancer Father   ?     colon  ? Other Sister   ?     died after knee surgery  ?  ?Social History  ? ?Tobacco Use  ? Smoking status: Former  ?  Packs/day: 1.00  ?  Years: 38.00  ?  Pack years: 38.00  ?  Types: Cigarettes  ?  Quit date: 06/09/1989  ?  Years since quitting: 32.3  ? Smokeless tobacco: Never  ? Tobacco comments:  ?  Also smoke pipe in 1969 to 1991, several times a day  ?Substance Use Topics  ? Alcohol use: No  ? ?Marital Status: Married  ? ?ROS  ?Review of Systems  ?Cardiovascular:  Positive for dyspnea on exertion. Negative for chest pain, leg swelling, palpitations, paroxysmal nocturnal dyspnea and syncope.  ?Neurological:  Positive for dizziness.  ? ?Objective  ?Blood pressure 118/62.  ?Vitals with BMI 02/10/2020 02/22/2018 02/22/2018  ?Height '5\' 8"'$  - -  ?Weight 208 lbs - -  ?BMI 31.63 - -  ?  Systolic 476 546 503  ?Diastolic 70 546 95  ?Pulse 80 88 100  ? ?No data found. ? ? Physical Exam ?Vitals reviewed.  ?Constitutional:   ?   General: He is not in acute distress. ?Cardiovascular:  ?   Rate and Rhythm: Normal rate. Rhythm irregular.  ?   Pulses:     ?     Carotid pulses are 2+ on the right side and 2+ on the left side. ?     Radial pulses are 2+ on the right side and 2+ on the left side.  ?     Femoral pulses are 1+ on the right side and 1+ on the left side. ?     Popliteal pulses are 2+ on the right side and 2+ on the left side.  ?     Dorsalis pedis pulses are 2+ on the right side and 2+ on the left side.  ?      Posterior tibial pulses are 2+ on the right side and 2+ on the left side.  ?   Heart sounds: S1 normal and S2 normal. Heart sounds are distant. No murmur heard. ?   Comments: No JVD.  ?Pulmonary:  ?   Effort: Pulmonary effort is normal. No respiratory distress.  ?   Breath sounds: Wheezing (Bilaterally throughout ) present.  ?Musculoskeletal:  ?   Right lower leg: No edema.  ?   Left lower leg: No edema.  ?Neurological:  ?   Mental Status: He is alert.  ? ?Laboratory examination:  ? ?No results for input(s): NA, K, CL, CO2, GLUCOSE, BUN, CREATININE, CALCIUM, GFRNONAA, GFRAA in the last 8760 hours. ?CrCl cannot be calculated (Patient's most recent lab result is older than the maximum 21 days allowed.).  ? ?  Latest Ref Rng & Units 02/22/2018  ?  3:30 PM 08/17/2017  ?  9:40 AM 08/16/2017  ? 11:38 PM  ?CMP  ?Glucose 70 - 99 mg/dL 112   137   104    ?BUN 8 - 23 mg/dL '18   25   23    '$ ?Creatinine 0.61 - 1.24 mg/dL 1.64   1.77   1.84    ?Sodium 135 - 145 mmol/L 141   138   141    ?Potassium 3.5 - 5.1 mmol/L 4.1   3.9   4.2    ?Chloride 98 - 111 mmol/L 103   105   104    ?CO2 22 - 32 mmol/L '28   25   27    '$ ?Calcium 8.9 - 10.3 mg/dL 9.2   8.2   8.7    ? ? ?  Latest Ref Rng & Units 02/22/2018  ?  3:30 PM 08/17/2017  ?  9:40 AM 08/16/2017  ? 11:38 PM  ?CBC  ?WBC 4.0 - 10.5 K/uL 11.4   8.1   12.5    ?Hemoglobin 13.0 - 17.0 g/dL 16.2   15.1   17.1    ?Hematocrit 39.0 - 52.0 % 50.5   46.7   52.3    ?Platelets 150 - 400 K/uL 251   164   230    ? ? ?Lipid Panel ?No results for input(s): CHOL, TRIG, LDLCALC, VLDL, HDL, CHOLHDL, LDLDIRECT in the last 8760 hours. ? ?HEMOGLOBIN A1C ?Lab Results  ?Component Value Date  ? HGBA1C 5.4 08/17/2017  ? MPG 108.28 08/17/2017  ? ?TSH ?No results for input(s): TSH in the last 8760 hours.  ? ?External Labs:  ?08/29/2021: ?Total cholesterol  143, triglycerides 117, HDL 52, LDL 70 ?BUN 39, creatinine 1.3, GFR 51, sodium 146, potassium 4.6 ?Hgb 16.4, HCT 40.8, MCV 89, platelet 319 ? ?08/16/2021: ?BNP 473 ?TSH  7.74 ? ?02/09/2020:  ?Glucose 126, BUN 19, creatinine 1.36, GFR 48, CMP otherwise within normal limits. ?CBC within normal limits ?Triglycerides 255, HDL 45, LDL 63, non-HDL 104, total 149 ?proBNP 169 ? ?Allergies  ?No Known Allergies  ? ?Medications Prior to Visit:  ? ?Outpatient Medications Prior to Visit  ?Medication Sig Dispense Refill  ? albuterol (VENTOLIN HFA) 108 (90 Base) MCG/ACT inhaler Inhale 2 puffs into the lungs every 6 (six) hours as needed.      ? atorvastatin (LIPITOR) 20 MG tablet Take 1 tablet (20 mg total) by mouth daily at 6 PM. 30 tablet 0  ? metoprolol succinate (TOPROL-XL) 100 MG 24 hr tablet Take 1 tablet (100 mg total) by mouth daily. 90 tablet 3  ? Multiple Vitamin (MULTIVITAMIN WITH MINERALS) TABS Take 1 tablet by mouth daily.    ? Rivaroxaban (XARELTO) 15 MG TABS tablet Take 15 mg by mouth daily with supper.    ? amiodarone (PACERONE) 200 MG tablet Take 200 mg twice daily for 1 week. Then take 200 mg once daily from then on. 30 tablet 3  ? meclizine (ANTIVERT) 25 MG tablet Take 1 tablet by mouth every 8 (eight) hours as needed. (Patient not taking: Reported on 09/19/2021)    ? ?No facility-administered medications prior to visit.  ? ?Final Medications at End of Visit   ? ?Current Meds  ?Medication Sig  ? albuterol (VENTOLIN HFA) 108 (90 Base) MCG/ACT inhaler Inhale 2 puffs into the lungs every 6 (six) hours as needed.    ? atorvastatin (LIPITOR) 20 MG tablet Take 1 tablet (20 mg total) by mouth daily at 6 PM.  ? metoprolol succinate (TOPROL-XL) 100 MG 24 hr tablet Take 1 tablet (100 mg total) by mouth daily.  ? Multiple Vitamin (MULTIVITAMIN WITH MINERALS) TABS Take 1 tablet by mouth daily.  ? Rivaroxaban (XARELTO) 15 MG TABS tablet Take 15 mg by mouth daily with supper.  ? [DISCONTINUED] amiodarone (PACERONE) 200 MG tablet Take 0.5 tablets (100 mg total) by mouth daily. Take 200 mg twice daily for 1 week. Then take 200 mg once daily from then on.  ? ?Radiology:  ? ?No results  found. ? ?Cardiac Studies:  ?Echocardiogram 08/13/2021: ?Mild calcification of the ascending aorta. Left Ventricle is normal in size, normal wall thickness, LVEF 50-55%.  LV systolic function is borderline reduced with borderl

## 2021-09-25 ENCOUNTER — Ambulatory Visit: Payer: Medicare HMO | Admitting: Student

## 2021-10-10 NOTE — Progress Notes (Signed)
? ?Primary Physician/Referring:  [name], NP ? ?Patient ID: Richard Stanley, male    DOB: 09/26/1935, 86 y.o.   MRN: [id number] ? ?Chief Complaint  ?Patient presents with  ? Atrial Fibrillation  ? Follow-up  ?  3 weeks  ? ?HPI:   ? ?Richard Stanley  is a 86 y.o. male with history of hypertension, hyperlipidemia, asthma, chronic kidney disease stage III, COPD. He was found to have A. fib with RVR upon admission to the hospital in March 2019, he spontaneously self converted to normal sinus rhythm. He then followed up in the office and was found to have orthostasis and started on midodrine. ? ?Patient was seen in our office in 2019 and unfortunately lost to follow-up until 02/2020 at which time he presented to reestablish care given history of atrial fibrillation.  At that time started Eliquis 2.5 mg daily given CKD and stopped aspirin.  Also ordered echocardiogram which revealed normal LVEF with mild aortic regurgitation.  Patient was again lost to follow up until 08/30/2021. He had been admitted to [facility] 08/12/2021 - 08/18/2021 with atrial fibrillation with RVR.  Patient was rate controlled and discharged with Eliquis, diltiazem CD 240 mg daily, metoprolol succinate 100 g twice daily, as well as atorvastatin 20 mg daily. ? ?Patient was seen in our office in follow-up about 3 weeks ago.  At which time discussed undergoing direct-current cardioversion as patient had been anticoagulated and despite amiodarone remained in symptomatic atrial fibrillation. He now presents for follow up.  Patient remains fatigued and reports episodes of dizziness as well as dyspnea on exertion.  He continues to tolerate anticoagulation without bleeding diathesis.   ? ?Past Medical History:  ?Diagnosis Date  ? ANXIETY 05/13/2010  ? Asthma 09/26/2010  ? Atrial fibrillation with RVR ([place]) 08/16/2017  ? Benign essential HTN   ? 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 CALCU GALLBLADD&BD W/O CHOLCYST W/O MENTION OBST  06/28/2010  ? CKD (chronic kidney disease), stage III ([place])   ? COPD (chronic obstructive pulmonary disease) ([place])   ? DEPRESSION 06/22/2009  ? 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 HYPERLIPIDEMIA 06/18/2009  ? Nodule of right lung   ? Other diseases of lung, not elsewhere classified 06/18/2009  ? Rash 10/01/2010  ? Thyroid nodule 10/01/2010  ? ?Past Surgical History:  ?Procedure Laterality Date  ? CARDIAC CATHETERIZATION  1998  ? normal per pt  ? ?Family History  ?Problem Relation Age of Onset  ? Colon cancer Father   ? Cancer Father   ?     colon  ? Other Sister   ?     died after knee surgery  ?  ?Social History  ? ?Tobacco Use  ? Smoking status: Former  ?  Packs/day: 1.00  ?  Years: 38.00  ?  Pack years: 38.00  ?  Types: Cigarettes  ?  Quit date: 06/09/1989  ?  Years since quitting: 32.3  ? Smokeless tobacco: Never  ? Tobacco comments:  ?  Also smoke pipe in 1969 to 1991, several times a day  ?Substance Use Topics  ? Alcohol use: No  ? ?Marital Status: Married  ? ?ROS  ?Review of Systems  ?Cardiovascular:  Positive for dyspnea on exertion. Negative for chest pain, leg swelling, palpitations, paroxysmal nocturnal dyspnea and syncope.  ?Neurological:  Positive for dizziness.  ? ?Objective  ?Blood pressure 109/79, pulse 80, temperature (!) 97.3 ?F (36.3 ?C), temperature source Temporal, resp. rate 16, height '5\' 8"'$  (1.727  m), weight 162 lb (73.5 kg), SpO2 97 %.  ?Vitals with BMI 02/10/2020 02/22/2018 02/22/2018  ?Height '5\' 8"'$  - -  ?Weight 208 lbs - -  ?BMI 31.63 - -  ?Systolic 287 867 672  ?Diastolic 70 094 95  ?Pulse 80 88 100  ? ?Orthostatic VS for the past 72 hrs (Last 3 readings): ? Patient Position BP Location Cuff Size  ?10/11/21 0925 Sitting Left Arm Normal  ?10/11/21 0924 Sitting Left Arm Normal  ? ? ? Physical Exam ?Vitals reviewed.  ?Constitutional:   ?   General: He is not in acute distress. ?Cardiovascular:  ?   Rate and Rhythm: Normal rate. Rhythm irregular.  ?   Pulses:     ?     Carotid pulses are 2+ on the  right side and 2+ on the left side. ?     Radial pulses are 2+ on the right side and 2+ on the left side.  ?     Femoral pulses are 1+ on the right side and 1+ on the left side. ?     Popliteal pulses are 2+ on the right side and 2+ on the left side.  ?     Dorsalis pedis pulses are 2+ on the right side and 2+ on the left side.  ?     Posterior tibial pulses are 2+ on the right side and 2+ on the left side.  ?   Heart sounds: S1 normal and S2 normal. Heart sounds are distant. No murmur heard. ?   Comments: No JVD.  ?Pulmonary:  ?   Effort: Pulmonary effort is normal. No respiratory distress.  ?   Breath sounds: Wheezing (Bilaterally throughout ) present.  ?Musculoskeletal:  ?   Right lower leg: No edema.  ?   Left lower leg: No edema.  ?Neurological:  ?   Mental Status: He is alert.  ?Physical exam unchanged compared to previous office visit. ? ?Laboratory examination:  ? ?No results for input(s): NA, K, CL, CO2, GLUCOSE, BUN, CREATININE, CALCIUM, GFRNONAA, GFRAA in the last 8760 hours. ?CrCl cannot be calculated (Patient's most recent lab result is older than the maximum 21 days allowed.).  ? ?  Latest Ref Rng & Units 02/22/2018  ?  3:30 PM 08/17/2017  ?  9:40 AM 08/16/2017  ? 11:38 PM  ?CMP  ?Glucose 70 - 99 mg/dL 112   137   104    ?BUN 8 - 23 mg/dL '18   25   23    '$ ?Creatinine 0.61 - 1.24 mg/dL 1.64   1.77   1.84    ?Sodium 135 - 145 mmol/L 141   138   141    ?Potassium 3.5 - 5.1 mmol/L 4.1   3.9   4.2    ?Chloride 98 - 111 mmol/L 103   105   104    ?CO2 22 - 32 mmol/L '28   25   27    '$ ?Calcium 8.9 - 10.3 mg/dL 9.2   8.2   8.7    ? ? ?  Latest Ref Rng & Units 02/22/2018  ?  3:30 PM 08/17/2017  ?  9:40 AM 08/16/2017  ? 11:38 PM  ?CBC  ?WBC 4.0 - 10.5 K/uL 11.4   8.1   12.5    ?Hemoglobin 13.0 - 17.0 g/dL 16.2   15.1   17.1    ?Hematocrit 39.0 - 52.0 % 50.5   46.7   52.3    ?Platelets 150 -  400 K/uL 251   164   230    ? ? ?Lipid Panel ?No results for input(s): CHOL, TRIG, LDLCALC, VLDL, HDL, CHOLHDL, LDLDIRECT in the last  8760 hours. ? ?HEMOGLOBIN A1C ?Lab Results  ?Component Value Date  ? HGBA1C 5.4 08/17/2017  ? MPG 108.28 08/17/2017  ? ?TSH ?No results for input(s): TSH in the last 8760 hours.  ? ?External Labs:  ?08/29/2021: ?Total cholesterol 143, triglycerides 117, HDL 52, LDL 70 ?BUN 39, creatinine 1.3, GFR 51, sodium 146, potassium 4.6 ?Hgb 16.4, HCT 40.8, MCV 89, platelet 319 ? ?08/16/2021: ?BNP 473 ?TSH 7.74 ? ?02/09/2020:  ?Glucose 126, BUN 19, creatinine 1.36, GFR 48, CMP otherwise within normal limits. ?CBC within normal limits ?Triglycerides 255, HDL 45, LDL 63, non-HDL 104, total 149 ?proBNP 169 ? ?Allergies  ?No Known Allergies  ? ?Medications Prior to Visit:  ? ?Outpatient Medications Prior to Visit  ?Medication Sig Dispense Refill  ? albuterol (VENTOLIN HFA) 108 (90 Base) MCG/ACT inhaler Inhale 2 puffs into the lungs every 6 (six) hours as needed.      ? amiodarone (PACERONE) 100 MG tablet Take 1 tablet (100 mg total) by mouth daily. 90 tablet 3  ? atorvastatin (LIPITOR) 20 MG tablet Take 1 tablet (20 mg total) by mouth daily at 6 PM. 30 tablet 0  ? metoprolol succinate (TOPROL-XL) 100 MG 24 hr tablet Take 1 tablet (100 mg total) by mouth daily. 90 tablet 3  ? Multiple Vitamin (MULTIVITAMIN WITH MINERALS) TABS Take 1 tablet by mouth daily.    ? Rivaroxaban (XARELTO) 15 MG TABS tablet Take 15 mg by mouth daily with supper.    ? ?No facility-administered medications prior to visit.  ? ?Final Medications at End of Visit   ? ?Current Meds  ?Medication Sig  ? albuterol (VENTOLIN HFA) 108 (90 Base) MCG/ACT inhaler Inhale 2 puffs into the lungs every 6 (six) hours as needed.    ? amiodarone (PACERONE) 100 MG tablet Take 1 tablet (100 mg total) by mouth daily.  ? atorvastatin (LIPITOR) 20 MG tablet Take 1 tablet (20 mg total) by mouth daily at 6 PM.  ? metoprolol succinate (TOPROL-XL) 100 MG 24 hr tablet Take 1 tablet (100 mg total) by mouth daily.  ? Multiple Vitamin (MULTIVITAMIN WITH MINERALS) TABS Take 1 tablet by mouth  daily.  ? Rivaroxaban (XARELTO) 15 MG TABS tablet Take 15 mg by mouth daily with supper.  ? ?Radiology:  ? ?No results found. ? ?Cardiac Studies:  ?Echocardiogram 08/13/2021: ?Mild calcification of the asce

## 2021-10-11 ENCOUNTER — Encounter: Payer: Self-pay | Admitting: Student

## 2021-10-11 ENCOUNTER — Ambulatory Visit: Payer: Medicare HMO | Admitting: Student

## 2021-10-11 VITALS — BP 109/79 | HR 80 | Temp 97.3°F | Resp 16 | Ht 68.0 in | Wt 162.0 lb

## 2021-10-11 DIAGNOSIS — I480 Paroxysmal atrial fibrillation: Secondary | ICD-10-CM

## 2021-10-11 DIAGNOSIS — R0602 Shortness of breath: Secondary | ICD-10-CM

## 2021-10-21 ENCOUNTER — Telehealth: Payer: Self-pay

## 2021-10-21 MED ORDER — METOPROLOL SUCCINATE ER 100 MG PO TB24: 100.0000 mg | ORAL_TABLET | Freq: Every day | ORAL | 3 refills | Status: DC

## 2021-10-21 NOTE — Telephone Encounter (Signed)
Patient wife called requesting a refill on Metoprolol and samples of Xarelto '15mg'$ . She will be here to pick them up this afternoon.  ?

## 2021-11-06 ENCOUNTER — Encounter (HOSPITAL_COMMUNITY): Payer: Self-pay | Admitting: Cardiology

## 2021-11-12 ENCOUNTER — Encounter (HOSPITAL_COMMUNITY)
Admission: RE | Disposition: A | Discharge status: Home / Self Care | Payer: Self-pay | Source: Home / Self Care | Attending: Cardiology

## 2021-11-12 ENCOUNTER — Encounter (HOSPITAL_COMMUNITY): Payer: Self-pay | Admitting: Cardiology

## 2021-11-12 ENCOUNTER — Ambulatory Visit (HOSPITAL_BASED_OUTPATIENT_CLINIC_OR_DEPARTMENT_OTHER): Payer: Medicare HMO | Admitting: Certified Registered Nurse Anesthetist

## 2021-11-12 ENCOUNTER — Other Ambulatory Visit: Payer: Self-pay

## 2021-11-12 ENCOUNTER — Ambulatory Visit (HOSPITAL_COMMUNITY)
Admission: RE | Admit: 2021-11-12 | Discharge: 2021-11-12 | Disposition: A | Discharge status: Home / Self Care | Payer: Medicare HMO | Attending: Cardiology | Admitting: Cardiology

## 2021-11-12 ENCOUNTER — Ambulatory Visit (HOSPITAL_COMMUNITY): Payer: Medicare HMO | Admitting: Certified Registered Nurse Anesthetist

## 2021-11-12 DIAGNOSIS — N183 Chronic kidney disease, stage 3 unspecified: Secondary | ICD-10-CM | POA: Diagnosis not present

## 2021-11-12 DIAGNOSIS — R5383 Other fatigue: Secondary | ICD-10-CM | POA: Diagnosis not present

## 2021-11-12 DIAGNOSIS — I4819 Other persistent atrial fibrillation: Secondary | ICD-10-CM | POA: Diagnosis present

## 2021-11-12 DIAGNOSIS — I4891 Unspecified atrial fibrillation: Secondary | ICD-10-CM | POA: Diagnosis not present

## 2021-11-12 DIAGNOSIS — I129 Hypertensive chronic kidney disease with stage 1 through stage 4 chronic kidney disease, or unspecified chronic kidney disease: Secondary | ICD-10-CM | POA: Diagnosis not present

## 2021-11-12 DIAGNOSIS — I10 Essential (primary) hypertension: Secondary | ICD-10-CM | POA: Diagnosis not present

## 2021-11-12 DIAGNOSIS — J449 Chronic obstructive pulmonary disease, unspecified: Secondary | ICD-10-CM | POA: Diagnosis not present

## 2021-11-12 DIAGNOSIS — N289 Disorder of kidney and ureter, unspecified: Secondary | ICD-10-CM

## 2021-11-12 DIAGNOSIS — E785 Hyperlipidemia, unspecified: Secondary | ICD-10-CM | POA: Insufficient documentation

## 2021-11-12 DIAGNOSIS — Z87891 Personal history of nicotine dependence: Secondary | ICD-10-CM | POA: Diagnosis not present

## 2021-11-12 DIAGNOSIS — R0609 Other forms of dyspnea: Secondary | ICD-10-CM | POA: Insufficient documentation

## 2021-11-12 HISTORY — PX: CARDIOVERSION: SHX1299

## 2021-11-12 LAB — POCT I-STAT, CHEM 8
BUN: 27 mg/dL — ABNORMAL HIGH (ref 8–23)
Calcium, Ion: 1.1 mmol/L — ABNORMAL LOW (ref 1.15–1.40)
Chloride: 102 mmol/L (ref 98–111)
Creatinine, Ser: 1.8 mg/dL — ABNORMAL HIGH (ref 0.61–1.24)
Glucose, Bld: 98 mg/dL (ref 70–99)
HCT: 50 % (ref 39.0–52.0)
Hemoglobin: 17 g/dL (ref 13.0–17.0)
Potassium: 3.8 mmol/L (ref 3.5–5.1)
Sodium: 141 mmol/L (ref 135–145)
TCO2: 29 mmol/L (ref 22–32)

## 2021-11-12 SURGERY — CARDIOVERSION
Anesthesia: Monitor Anesthesia Care

## 2021-11-12 MED ORDER — SODIUM CHLORIDE 0.9 % IV SOLN: INTRAVENOUS | Status: DC

## 2021-11-12 MED ORDER — PROPOFOL 10 MG/ML IV BOLUS
INTRAVENOUS | Status: DC | PRN
  Administered 2021-11-12: 60 mg via INTRAVENOUS

## 2021-11-12 MED ORDER — LIDOCAINE 2% (20 MG/ML) 5 ML SYRINGE
INTRAMUSCULAR | Status: DC | PRN
  Administered 2021-11-12: 60 mg via INTRAVENOUS

## 2021-11-12 NOTE — Anesthesia Postprocedure Evaluation (Signed)
Anesthesia Post Note  Patient: CHRLES SELLEY  Procedure(s) Performed: CARDIOVERSION     Patient location during evaluation: Endoscopy Anesthesia Type: MAC Level of consciousness: awake Pain management: pain level controlled Vital Signs Assessment: post-procedure vital signs reviewed and stable Respiratory status: spontaneous breathing Cardiovascular status: stable Postop Assessment: no apparent nausea or vomiting Anesthetic complications: no   No notable events documented.  Last Vitals:  Vitals:   11/12/21 1053 11/12/21 1100  BP: 119/60 118/65  Pulse: (!) 50   Resp: 20   Temp:    SpO2: 95% 95%    Last Pain:  Vitals:   11/12/21 1100  TempSrc:   PainSc: 0-No pain                 Marshell Rieger

## 2021-11-12 NOTE — Anesthesia Preprocedure Evaluation (Signed)
Anesthesia Evaluation  Patient identified by MRN, date of birth, ID band Patient awake    Reviewed: Allergy & Precautions, NPO status , Patient's Chart, lab work & pertinent test results  Airway Mallampati: II  TM Distance: >3 FB     Dental   Pulmonary asthma , COPD, former smoker,    breath sounds clear to auscultation       Cardiovascular hypertension, + dysrhythmias Atrial Fibrillation (-) Valvular Problems/Murmurs Rhythm:Regular Rate:Normal     Neuro/Psych PSYCHIATRIC DISORDERS    GI/Hepatic negative GI ROS, Neg liver ROS,   Endo/Other  negative endocrine ROS  Renal/GU Renal disease     Musculoskeletal   Abdominal   Peds  Hematology   Anesthesia Other Findings   Reproductive/Obstetrics                             Anesthesia Physical Anesthesia Plan  ASA: 3  Anesthesia Plan: MAC   Post-op Pain Management:    Induction: Intravenous  PONV Risk Score and Plan: 1 and Propofol infusion and Treatment may vary due to age or medical condition  Airway Management Planned: Nasal Cannula and Simple Face Mask  Additional Equipment:   Intra-op Plan:   Post-operative Plan:   Informed Consent: I have reviewed the patients History and Physical, chart, labs and discussed the procedure including the risks, benefits and alternatives for the proposed anesthesia with the patient or authorized representative who has indicated his/her understanding and acceptance.     Dental advisory given  Plan Discussed with: CRNA and Anesthesiologist  Anesthesia Plan Comments:         Anesthesia Quick Evaluation

## 2021-11-12 NOTE — Transfer of Care (Signed)
Immediate Anesthesia Transfer of Care Note  Patient: Richard Stanley  Procedure(s) Performed: CARDIOVERSION  Patient Location: Endoscopy Unit  Anesthesia Type:General  Level of Consciousness: drowsy and patient cooperative  Airway & Oxygen Therapy: Patient Spontanous Breathing and Patient connected to face mask oxygen  Post-op Assessment: Report given to RN and Post -op Vital signs reviewed and stable  Post vital signs: Reviewed and stable  Last Vitals:  Vitals Value Taken Time  BP 104/54   Temp    Pulse 44   Resp 14   SpO2 100%     Last Pain:  Vitals:   11/12/21 0951  TempSrc: Temporal  PainSc: 0-No pain         Complications: No notable events documented.

## 2021-11-12 NOTE — Discharge Instructions (Signed)

## 2021-11-12 NOTE — CV Procedure (Signed)
Direct current cardioversion 11/12/2021 10:35 AM  Indication symptomatic A. Fibrillation.  Procedure: Using 60 mg of IV Propofol and 60 IV Lidocaine (for reducing venous pain) for achieving deep sedation, synchronized direct current cardioversion performed. Patient was delivered with 120 x1, 150x1 then 200 Joules of electricity X 1 with success to NSR. Patient tolerated the procedure well. No immediate complication noted.   Allergies as of 11/12/2021   No Known Allergies      Medication List     TAKE these medications    albuterol 108 (90 Base) MCG/ACT inhaler Commonly known as: VENTOLIN HFA Inhale 2 puffs into the lungs every 6 (six) hours as needed for wheezing or shortness of breath.   amiodarone 100 MG tablet Commonly known as: Pacerone Take 1 tablet (100 mg total) by mouth daily.   atorvastatin 20 MG tablet Commonly known as: LIPITOR Take 1 tablet (20 mg total) by mouth daily at 6 PM.   metoprolol succinate 100 MG 24 hr tablet Commonly known as: TOPROL-XL Take 1 tablet (100 mg total) by mouth daily.   multivitamin with minerals Tabs tablet Take 1 tablet by mouth daily.   Rivaroxaban 15 MG Tabs tablet Commonly known as: XARELTO Take 15 mg by mouth daily with supper.          Adrian Prows, MD, [facility] 11/12/2021, 10:35 AM Office: [phone] Fax: [phone] Pager: [phone]

## 2021-11-13 ENCOUNTER — Encounter (HOSPITAL_COMMUNITY): Payer: Self-pay | Admitting: Cardiology

## 2021-11-21 NOTE — Progress Notes (Signed)
Primary Physician/Referring:  [name], NP  Patient ID: Richard Stanley, male    DOB: Jun 28, 1935, 86 y.o.   MRN: [id number]  Chief Complaint  Patient presents with  . Atrial Fibrillation  . Follow-up    6 weeks   HPI:    Richard Stanley  is a 86 y.o. male with history of hypertension, hyperlipidemia, asthma, chronic kidney disease stage III, COPD.  Patient diagnosed with atrial fibrillation in March 2019.  He was hospitalized in March 2023 with A-fib with RVR and has subsequently followed up with our office.    Patient was last seen in the office 10/11/2021 at which time he remained in atrial fibrillation and symptomatic despite up titration of AV nodal blocking agents and addition of amiodarone.  Decision was therefore to proceed with direct-current cardioversion.  Also at last office visit patient and his family opted out regarding further ischemic evaluation.  Patient underwent successful direct-current cardioversion 11/12/2021 with return to normal sinus rhythm. Patient now presents for follow up.  Patient's energy level has improved since last office visit and he is presently maintaining normal sinus rhythm.  However he does continue to complain of dyspnea on exertion.  Dizziness has also improved.  Continues to tolerate anticoagulation without bleeding diathesis.  Past Medical History:  Diagnosis Date  . ANXIETY 05/13/2010  . Asthma 09/26/2010  . Atrial fibrillation with RVR ([place]) 08/16/2017  . Benign essential HTN   . 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 CKD (chronic kidney disease), stage III ([place])   . COPD (chronic obstructive pulmonary disease) ([place])   . DEPRESSION 06/22/2009  . 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 HYPERLIPIDEMIA 06/18/2009  . Nodule of right lung   . Other diseases of lung, not elsewhere classified 06/18/2009  . Rash 10/01/2010  . Thyroid nodule 10/01/2010   Past Surgical  History:  Procedure Laterality Date  . [place]   normal per pt  . CARDIOVERSION N/A 11/12/2021   Procedure: CARDIOVERSION;  Surgeon: Adrian Prows, MD;  Location: [facility] ENDOSCOPY;  Service: Cardiovascular;  Laterality: N/A;   Family History  Problem Relation Age of Onset  . Colon cancer Father   . Cancer Father        colon  . Other Sister        died after knee surgery    Social History   Tobacco Use  . Smoking status: Former    Packs/day: 1.00    Years: 38.00    Total pack years: 38.00    Types: Cigarettes    Quit date: 06/09/1989    Years since quitting: 32.4  . Smokeless tobacco: Never  . Tobacco comments:    Also smoke pipe in 1969 to 1991, several times a day  Substance Use Topics  . Alcohol use: No   Marital Status: Married   ROS  Review of Systems  Cardiovascular:  Positive for dyspnea on exertion (improved). Negative for chest pain, leg swelling, palpitations, paroxysmal nocturnal dyspnea and syncope.  Neurological:  Negative for dizziness.    Objective  Blood pressure 114/75, pulse (!) 55, temperature 98 F (36.7 C), temperature source Temporal, resp. rate 16, height '5\' 8"'$  (1.727 m), weight 156 lb 9.6 oz (71 kg).  Vitals with BMI 02/10/2020 02/22/2018 02/22/2018  Height '5\' 8"'$  - -  Weight 208 lbs - -  BMI 41.74 - -  Systolic 081 448 185  Diastolic 70 631 95  Pulse 80 88 100   Orthostatic VS for the past 72 hrs (Last 3 readings):  Patient Position BP Location Cuff Size  11/22/21 1006 Sitting Left Arm Normal     Physical Exam Vitals reviewed.  Constitutional:      General: He is not in acute distress. Cardiovascular:     Rate and Rhythm: Regular rhythm. Bradycardia present.     Pulses:          Carotid pulses are 2+ on the right side and 2+ on the left side.      Radial pulses are 2+ on the right side and 2+ on the left side.       Femoral pulses are 1+ on the right side and 1+ on the left side.      Popliteal pulses are 2+ on the right  side and 2+ on the left side.       Dorsalis pedis pulses are 2+ on the right side and 2+ on the left side.       Posterior tibial pulses are 2+ on the right side and 2+ on the left side.     Heart sounds: S1 normal and S2 normal. Heart sounds are distant. No murmur heard.    Comments: No JVD.  Pulmonary:     Effort: Pulmonary effort is normal. No respiratory distress.     Breath sounds: Wheezing (Bilaterally throughout ) present.  Musculoskeletal:     Right lower leg: No edema.     Left lower leg: No edema.  Neurological:     Mental Status: He is alert.    Laboratory examination:   Recent Labs    11/12/21 1008  NA 141  K 3.8  CL 102  GLUCOSE 98  BUN 27*  CREATININE 1.80*   estimated creatinine clearance is 29.6 mL/min (A) (by C-G formula based on SCr of 1.8 mg/dL (H)).     Latest Ref Rng & Units 11/12/2021   10:08 AM 02/22/2018    3:30 PM 08/17/2017    9:40 AM  CMP  Glucose 70 - 99 mg/dL 98  112  137   BUN 8 - 23 mg/dL '27  18  25   '$ Creatinine 0.61 - 1.24 mg/dL 1.80  1.64  1.77   Sodium 135 - 145 mmol/L 141  141  138   Potassium 3.5 - 5.1 mmol/L 3.8  4.1  3.9   Chloride 98 - 111 mmol/L 102  103  105   CO2 22 - 32 mmol/L  28  25   Calcium 8.9 - 10.3 mg/dL  9.2  8.2       Latest Ref Rng & Units 11/12/2021   10:08 AM 02/22/2018    3:30 PM 08/17/2017    9:40 AM  CBC  WBC 4.0 - 10.5 K/uL  11.4  8.1   Hemoglobin 13.0 - 17.0 g/dL 17.0  16.2  15.1   Hematocrit 39.0 - 52.0 % 50.0  50.5  46.7   Platelets 150 - 400 K/uL  251  164     Lipid Panel No results for input(s): "CHOL", "TRIG", "LDLCALC", "VLDL", "HDL", "CHOLHDL", "LDLDIRECT" in the last 8760 hours.  HEMOGLOBIN A1C Lab Results  Component Value Date   HGBA1C 5.4 08/17/2017   MPG 108.28 08/17/2017   TSH No results for input(s): "TSH" in the last 8760 hours.   External Labs:  08/29/2021: Total cholesterol 143, triglycerides 117, HDL 52, LDL 70 BUN 39, creatinine 1.3, GFR 51, sodium 146, potassium 4.6 Hgb  16.4,  HCT 40.8, MCV 89, platelet 319  08/16/2021: BNP 473 TSH 7.74  02/09/2020:  Glucose 126, BUN 19, creatinine 1.36, GFR 48, CMP otherwise within normal limits. CBC within normal limits Triglycerides 255, HDL 45, LDL 63, non-HDL 104, total 149 proBNP 169  Allergies  No Known Allergies   Medications Prior to Visit:   Outpatient Medications Prior to Visit  Medication Sig Dispense Refill  . albuterol (VENTOLIN HFA) 108 (90 Base) MCG/ACT inhaler Inhale 2 puffs into the lungs every 6 (six) hours as needed for wheezing or shortness of breath.    Marland Kitchen amiodarone (PACERONE) 100 MG tablet Take 1 tablet (100 mg total) by mouth daily. 90 tablet 3  . atorvastatin (LIPITOR) 20 MG tablet Take 1 tablet (20 mg total) by mouth daily at 6 PM. 30 tablet 0  . metoprolol succinate (TOPROL-XL) 100 MG 24 hr tablet Take 1 tablet (100 mg total) by mouth daily. 90 tablet 3  . Rivaroxaban (XARELTO) 15 MG TABS tablet Take 15 mg by mouth daily with supper.    . Multiple Vitamin (MULTIVITAMIN WITH MINERALS) TABS Take 1 tablet by mouth daily.     No facility-administered medications prior to visit.   Final Medications at End of Visit    Current Meds  Medication Sig  . albuterol (VENTOLIN HFA) 108 (90 Base) MCG/ACT inhaler Inhale 2 puffs into the lungs every 6 (six) hours as needed for wheezing or shortness of breath.  Marland Kitchen amiodarone (PACERONE) 100 MG tablet Take 1 tablet (100 mg total) by mouth daily.  Marland Kitchen atorvastatin (LIPITOR) 20 MG tablet Take 1 tablet (20 mg total) by mouth daily at 6 PM.  . [DISCONTINUED] metoprolol succinate (TOPROL-XL) 100 MG 24 hr tablet Take 1 tablet (100 mg total) by mouth daily.  . [DISCONTINUED] Rivaroxaban (XARELTO) 15 MG TABS tablet Take 15 mg by mouth daily with supper.   Radiology:   No results found.  Cardiac Studies:  Direct current cardioversion 11/12/2021 10:35 AM  Indication symptomatic A. Fibrillation.  Procedure: Using 60 mg of IV Propofol and 60 IV Lidocaine (for reducing  venous pain) for achieving deep sedation, synchronized direct current cardioversion performed. Patient was delivered with 120 x1, 150x1 then 200 Joules of electricity X 1 with success to NSR. Patient tolerated the procedure well. No immediate complication noted.   Echocardiogram 08/13/2021: Mild calcification of the ascending aorta. Left Ventricle is normal in size, normal wall thickness, LVEF 50-55%.  LV systolic function is borderline reduced with borderline global hypokinesis of the LV.  RV is normal in size and systolic function is mildly reduced. Right ventricular systolic pressure is normal.   Echocardiogram 08/17/2017:  Left ventricle: Cavity size was normal. Wall thickness was normal. Systolic function was normal. The estimated ejection fraction was in the range of 60% to 65%. Wall motion was normal; there were no regional wall abnormalities. Doppler parameters are consistent with abnormal LV relaxation (grade 1 diastolic dysfunction). Right atrium: mildly dilate.   EKG  11/22/2021: marked sinus bradycardia at a rate of 44 bpm.   09/23/2021: Atrial fibrillation with controlled ventricular response at a rate of 97 bpm.  Nonspecific T wave abnormality.  Left axis.   08/30/2021: Atrial fibrillation with controlled ventricular response at a rate of 104 bpm.  Nonspecific T wave abnormality.  02/10/2020: Normal sinus rhythm at a rate of 77 bpm, normal axis.  Nonspecific T wave abnormality in the anterior leads.   Assessment     ICD-10-CM   1. Paroxysmal atrial fibrillation (  [place])  I48.0 EKG 12-Lead       Medications Discontinued During This Encounter  Medication Reason  . Multiple Vitamin (MULTIVITAMIN WITH MINERALS) TABS   . metoprolol succinate (TOPROL-XL) 100 MG 24 hr tablet Discontinued by provider  . Rivaroxaban (XARELTO) 15 MG TABS tablet Reorder     Meds ordered this encounter  Medications  . Rivaroxaban (XARELTO) 15 MG TABS tablet    Sig: Take 1 tablet (15 mg total) by mouth  daily with supper.    Dispense:  30 tablet    Refill:  3   This patients CHA2DS2-VASc Score 3 (HTN, Age) and yearly risk of stroke 3.2%.   Recommendations:   KEATEN MASHEK is a 86 y.o. male with history of hypertension, hyperlipidemia, asthma, chronic kidney disease stage III, COPD.  Patient diagnosed with atrial fibrillation in March 2019.  He was hospitalized in March 2023 with A-fib with RVR and has subsequently followed up with our office.    Patient was last seen in the office 10/11/2021 at which time he remained in atrial fibrillation and symptomatic despite up titration of AV nodal blocking agents and addition of amiodarone.  Decision was therefore to proceed with direct-current cardioversion.  Also at last office visit patient and his family opted out regarding further ischemic evaluation.  Patient underwent successful direct-current cardioversion 11/12/2021 with return to normal sinus rhythm. Patient now presents for follow up.  Patient's symptoms of dizziness and fatigue have improved although he does continue to have dyspnea on exertion.  Patient is presently attaining sinus rhythm, however he is bradycardic.  We will therefore discontinue metoprolol and have him come for repeat EKG and 5 days.  Patient's blood pressure is also soft, which will improve with discontinuation of metoprolol.  Continue amiodarone 100 mg p.o. once daily.  Continue Xarelto.  Repeat EKG in 5 days.  Follow-up in 3 months, sooner if needed.   Alethia Berthold, PA-C 11/22/2021, 11:39 AM Office: [phone]

## 2021-11-22 ENCOUNTER — Ambulatory Visit: Payer: Medicare HMO | Admitting: Student

## 2021-11-22 ENCOUNTER — Encounter: Payer: Self-pay | Admitting: Student

## 2021-11-22 VITALS — BP 114/75 | HR 55 | Temp 98.0°F | Resp 16 | Ht 68.0 in | Wt 156.6 lb

## 2021-11-22 DIAGNOSIS — I480 Paroxysmal atrial fibrillation: Secondary | ICD-10-CM

## 2021-11-22 MED ORDER — RIVAROXABAN 15 MG PO TABS: 15.0000 mg | ORAL_TABLET | Freq: Every day | ORAL | 3 refills | Status: DC

## 2021-11-25 ENCOUNTER — Ambulatory Visit: Payer: Medicare HMO | Admitting: Student

## 2021-11-25 VITALS — BP 118/75 | HR 84 | Temp 98.1°F | Resp 17 | Ht 68.0 in | Wt 151.8 lb

## 2021-11-25 DIAGNOSIS — I10 Essential (primary) hypertension: Secondary | ICD-10-CM

## 2021-11-25 DIAGNOSIS — I480 Paroxysmal atrial fibrillation: Secondary | ICD-10-CM

## 2021-11-25 NOTE — Progress Notes (Signed)
Patient presents today for repeat EKG and blood pressure check since stopping metoprolol due to bradycardia. EKG today reveals normal sinus rhythm at a rate of 71 bpm, no longer bradycardic. Patient's blood pressure remains stable. Will continue current medications, no changes at this time.    Alethia Berthold, PA-C 11/25/2021, 1:07 PM Office: [phone]

## 2021-12-03 ENCOUNTER — Other Ambulatory Visit: Payer: Self-pay

## 2021-12-03 ENCOUNTER — Encounter (HOSPITAL_COMMUNITY): Payer: Self-pay

## 2021-12-03 ENCOUNTER — Telehealth: Payer: Self-pay

## 2021-12-03 ENCOUNTER — Emergency Department (HOSPITAL_COMMUNITY)
Admission: EM | Admit: 2021-12-03 | Discharge: 2021-12-04 | Disposition: A | Discharge status: Home / Self Care | Payer: Medicare HMO | Attending: Student | Admitting: Student

## 2021-12-03 DIAGNOSIS — Z046 Encounter for general psychiatric examination, requested by authority: Secondary | ICD-10-CM | POA: Diagnosis present

## 2021-12-03 DIAGNOSIS — F989 Unspecified behavioral and emotional disorders with onset usually occurring in childhood and adolescence: Secondary | ICD-10-CM | POA: Diagnosis not present

## 2021-12-03 DIAGNOSIS — F919 Conduct disorder, unspecified: Secondary | ICD-10-CM

## 2021-12-03 DIAGNOSIS — R4689 Other symptoms and signs involving appearance and behavior: Secondary | ICD-10-CM

## 2021-12-03 DIAGNOSIS — F329 Major depressive disorder, single episode, unspecified: Secondary | ICD-10-CM | POA: Insufficient documentation

## 2021-12-03 DIAGNOSIS — Z7901 Long term (current) use of anticoagulants: Secondary | ICD-10-CM | POA: Diagnosis not present

## 2021-12-03 DIAGNOSIS — R319 Hematuria, unspecified: Secondary | ICD-10-CM | POA: Insufficient documentation

## 2021-12-03 LAB — URINALYSIS, ROUTINE W REFLEX MICROSCOPIC
Bilirubin Urine: NEGATIVE
Glucose, UA: NEGATIVE mg/dL
Ketones, ur: 5 mg/dL — AB
Leukocytes,Ua: NEGATIVE
Nitrite: NEGATIVE
Protein, ur: NEGATIVE mg/dL
RBC / HPF: >50 RBC/hpf — ABNORMAL HIGH (ref 0–5)
Specific Gravity, Urine: 1.017 (ref 1.005–1.030)
pH: 5 (ref 5.0–8.0)

## 2021-12-03 LAB — CBC WITH DIFFERENTIAL/PLATELET
Abs Immature Granulocytes: 0.03 10*3/uL (ref 0.00–0.07)
Basophils Absolute: 0 10*3/uL (ref 0.0–0.1)
Basophils Relative: 0 %
Eosinophils Absolute: 0 10*3/uL (ref 0.0–0.5)
Eosinophils Relative: 1 %
HCT: 48.6 % (ref 39.0–52.0)
Hemoglobin: 15.7 g/dL (ref 13.0–17.0)
Immature Granulocytes: 0 %
Lymphocytes Relative: 8 %
Lymphs Abs: 0.6 10*3/uL — ABNORMAL LOW (ref 0.7–4.0)
MCH: 30.4 pg (ref 26.0–34.0)
MCHC: 32.3 g/dL (ref 30.0–36.0)
MCV: 94.2 fL (ref 80.0–100.0)
Monocytes Absolute: 0.6 10*3/uL (ref 0.1–1.0)
Monocytes Relative: 8 %
Neutro Abs: 5.7 10*3/uL (ref 1.7–7.7)
Neutrophils Relative %: 83 %
Platelets: 196 10*3/uL (ref 150–400)
RBC: 5.16 MIL/uL (ref 4.22–5.81)
RDW: 13.1 % (ref 11.5–15.5)
WBC: 6.9 10*3/uL (ref 4.0–10.5)
nRBC: 0 % (ref 0.0–0.2)

## 2021-12-03 LAB — COMPREHENSIVE METABOLIC PANEL
ALT: 14 U/L (ref 0–44)
AST: 16 U/L (ref 15–41)
Albumin: 3.8 g/dL (ref 3.5–5.0)
Alkaline Phosphatase: 66 U/L (ref 38–126)
Anion gap: 9 (ref 5–15)
BUN: 22 mg/dL (ref 8–23)
CO2: 28 mmol/L (ref 22–32)
Calcium: 9.1 mg/dL (ref 8.9–10.3)
Chloride: 101 mmol/L (ref 98–111)
Creatinine, Ser: 1.86 mg/dL — ABNORMAL HIGH (ref 0.61–1.24)
GFR, Estimated: 35 mL/min — ABNORMAL LOW (ref >60)
Glucose, Bld: 154 mg/dL — ABNORMAL HIGH (ref 70–99)
Potassium: 3.9 mmol/L (ref 3.5–5.1)
Sodium: 138 mmol/L (ref 135–145)
Total Bilirubin: 0.9 mg/dL (ref 0.3–1.2)
Total Protein: 6 g/dL — ABNORMAL LOW (ref 6.5–8.1)

## 2021-12-03 MED ORDER — AMIODARONE HCL 200 MG PO TABS: 100.0000 mg | ORAL_TABLET | Freq: Every day | ORAL | Status: DC

## 2021-12-03 MED ORDER — RIVAROXABAN 15 MG PO TABS
15.0000 mg | ORAL_TABLET | Freq: Every day | ORAL | Status: DC
Start: 2021-12-04 — End: 2021-12-04
  Filled 2021-12-03: qty 1

## 2021-12-03 MED ORDER — ATORVASTATIN CALCIUM 10 MG PO TABS
20.0000 mg | ORAL_TABLET | Freq: Every day | ORAL | Status: DC
  Administered 2021-12-03: 20 mg via ORAL
  Filled 2021-12-03: qty 2

## 2021-12-03 MED ORDER — ALBUTEROL SULFATE HFA 108 (90 BASE) MCG/ACT IN AERS: 2.0000 | INHALATION_SPRAY | Freq: Four times a day (QID) | RESPIRATORY_TRACT | Status: DC | PRN

## 2021-12-03 MED ORDER — ATORVASTATIN CALCIUM 10 MG PO TABS: 20.0000 mg | ORAL_TABLET | Freq: Every day | ORAL | Status: DC

## 2021-12-03 MED ORDER — AMIODARONE HCL 200 MG PO TABS
100.0000 mg | ORAL_TABLET | Freq: Every day | ORAL | Status: DC
  Administered 2021-12-04: 100 mg via ORAL
  Filled 2021-12-03: qty 1

## 2021-12-03 NOTE — ED Notes (Signed)
TTS in process 

## 2021-12-03 NOTE — Telephone Encounter (Signed)
Patient and daughter were on the phone and is stating that patient has been having suicidal thoughts and is acting very mentally unstable. Patient has also said a few times "when I kill myself, I'm taking your mom with me"  and she said that after a few hours, he would say " you know I'm joking, right?" And she described him as "a totally different person, almost like he was not himself" She said that it he began feeling like this after stopping Metoprolol and they read the side effects of stopping this medication and it says that it could cause suicidal/homicidal thoughts and/or severe anxiety. CC advised daughter to take him to ER to be evaluated. They would rather wait on a call from you before doing so.

## 2021-12-03 NOTE — ED Triage Notes (Signed)
Police report family called bc patient has been aggressive and having increased anger but thinks its bc his BP meds are not working.  Patient is calm and cooperative in triage.  Not under police hold.

## 2021-12-04 ENCOUNTER — Encounter (HOSPITAL_COMMUNITY): Payer: Self-pay | Admitting: Nurse Practitioner

## 2021-12-04 DIAGNOSIS — R4689 Other symptoms and signs involving appearance and behavior: Secondary | ICD-10-CM

## 2021-12-04 NOTE — ED Provider Notes (Signed)
Emergency Medicine Observation Re-evaluation Note  Richard Stanley is a 86 y.o. male, seen on rounds today.  Pt initially presented to the ED for complaints of Medical Clearance, Hypertension, Suicidal, and Homicidal Currently, the patient is resting comfortably awaiting psychiatric reassessment to determine disposition.  Physical Exam  BP (!) 158/90   Pulse 68   Temp 97.6 F (36.4 C) (Oral)   Resp 16   Ht '5\' 8"'$  (1.727 m)   Wt 68.5 kg   SpO2 98%   BMI 22.96 kg/m  Physical Exam General: Resting Cardiac: No murmur Lungs: Clear Psych: No agitation or distress  ED Course / MDM  EKG:EKG Interpretation  Date/Time:  Tuesday December 03 2021 18:15:51 EDT Ventricular Rate:  69 PR Interval:  147 QRS Duration: 93 QT Interval:  565 QTC Calculation: 606 R Axis:   -66 Text Interpretation: Sinus rhythm Left anterior fascicular block Borderline repolarization abnormality Prolonged QT interval Since last tracing QT has lengthened Confirmed by Daleen Bo [phone]) on 12/03/2021 7:50:41 PM  I have reviewed the labs performed to date as well as medications administered while in observation.  Recent changes in the last 24 hours include awaiting psychiatric reassessment.  Psychiatry saw him this morning and feel he is now psychiatrically here for outpatient follow-up at discharge.  Plan  Current plan is for discharge and outpatient follow-up now.Jeanice Lim is not under involuntary commitment.     Shahram Alexopoulos, Gwenyth Allegra, MD 12/04/21 1225

## 2021-12-04 NOTE — ED Notes (Signed)
Family called and will come to pick up PT .

## 2021-12-04 NOTE — Consult Note (Addendum)
[facility] ED ASSESSMENT   Reason for Consult:  aggression Referring Physician:  Marita Kansas, PA Patient Identification: Richard Stanley MRN:  [id number] ED Chief Complaint: Aggression  Diagnosis:  Principal Problem:   Aggression   ED Assessment Time Calculation: Start Time: 8099 Stop Time: 8338 Total Time in Minutes (Assessment Completion): 30   Subjective:   Richard Stanley is a 86 y.o. male patient admitted to [facility] after being aggressive and agitated towards his family at home. His wife Richard Stanley reported he made suicidal statements while upset such as "wish I would of died with my twin sister."  HPI:   Patient seen today in his room for face to face evaluation. Pt had bright affect, very pleasant, and cooperative with assessment. He is oriented to name, date of birth, location, president, and state. He tells me that he was started on Metoprolol which made him "angry." He reports since switching off of that BP medication to a new one, he has felt much better and like himself. He acknowledges that he was not acting like his normal self at home. When I mention the statements he made about threatening his family and wishing he would have died with his twin sister, he denies these allegations and states he would never hurt himself or his family. He does state he would like for his daughter and son-in-law to move out, as he does not like them living with him. He says they do not help cook or clean, they do not pay rent, and they take up too much room. He asked appropriate questions such as information on the eviction process. He states he normally sleeps very well at home, but has struggled to sleep well since being at the hospital due to environmental factors. Denies problems with appetite. Denies any auditory or visual hallucinations. Denies current SI/HI. Denies any previous suicidal attempts. No history of SIB. 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  Spoke with his wife,  Richard Stanley, via telephone. She is agreeable with plan to d/c back home. She states he does not regularly see a psychiatrist or therapist and requested for resources which will be added to EVS.  Past Psychiatric History:  Denies  Risk to Self or Others: Is the patient at risk to self? No Has the patient been a risk to self in the past 6 months? No Has the patient been a risk to self within the distant past? No Is the patient a risk to others? No Has the patient been a risk to others in the past 6 months? No Has the patient been a risk to others within the distant past? 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 06/28/2010   CKD (chronic kidney disease), stage III (HCC)    COPD (chronic obstructive pulmonary disease) ([place])  DEPRESSION 06/22/2009   ERECTILE DYSFUNCTION, 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   HYPERLIPIDEMIA 06/18/2009   Nodule of right lung    Other diseases of lung, not elsewhere classified 06/18/2009   Rash 10/01/2010   Thyroid nodule 10/01/2010    Past Surgical History:  Procedure Laterality Date   CARDIAC CATHETERIZATION  1998   normal per pt   CARDIOVERSION N/A 11/12/2021   Procedure: CARDIOVERSION;  Surgeon: Adrian Prows, MD;  Location[facility] ENDOSCOPY;  Service: Cardiovascular;  Laterality: N/A;   Family History:  Family History  Problem Relation  Age of Onset   Colon cancer Father    Cancer Father        colon   Other Sister        died after knee surgery   Family Psychiatric  History: denies Social History:  Social History   Substance and Sexual Activity  Alcohol Use No     Social History   Substance and Sexual Activity  Drug Use No    Social History   Socioeconomic History   Marital status: Married    Spouse name: Not on file   Number of children: 2   Years of education: Not on file   Highest education level: Not on file  Occupational History   Occupation: retired from [employer] Use   Smoking status: Former    Packs/day: 1.00    Years: 38.00    Total pack years: 38.00    Types: Cigarettes    Quit date: 06/09/1989    Years since quitting: 32.5   Smokeless tobacco: Never   Tobacco comments:    Also smoke pipe in 1969 to 1991, several times a day  Vaping Use   Vaping Use: Never used  Substance and Sexual Activity   Alcohol use: No   Drug use: No   Sexual activity: Never  Other Topics Concern   Not on file  Social History Narrative   Pt lives with wife, son lives with them.   Social Determinants of Health   Financial Resource Strain: Not on file  Food Insecurity: Not on file  Transportation Needs: Not on file  Physical Activity: Not on file  Stress: Not on file  Social Connections: Not on file   Additional Social History:    Allergies:  No Known Allergies  Labs:  Results for orders placed or performed during the hospital encounter of 12/03/21 (from the past 48 hour(s))  CBC with Differential     Status: Abnormal   Collection Time: 12/03/21  2:19 PM  Result Value Ref Range   WBC 6.9 4.0 - 10.5 K/uL   RBC 5.16 4.22 - 5.81 MIL/uL   Hemoglobin 15.7 13.0 - 17.0 g/dL   HCT 48.6 39.0 - 52.0 %   MCV 94.2 80.0 - 100.0 fL   MCH 30.4 26.0 - 34.0 pg   MCHC 32.3 30.0 - 36.0 g/dL   RDW 13.1 11.5 - 15.5 %   Platelets 196 150 - 400 K/uL   nRBC 0.0 0.0 - 0.2 %   Neutrophils  Relative % 83 %   Neutro Abs 5.7 1.7 - 7.7 K/uL   Lymphocytes Relative 8 %   Lymphs Abs 0.6 (L) 0.7 - 4.0 K/uL   Monocytes Relative 8 %   Monocytes Absolute 0.6 0.1 - 1.0 K/uL   Eosinophils Relative 1 %   Eosinophils Absolute 0.0 0.0 - 0.5 K/uL   Basophils Relative 0 %   Basophils Absolute 0.0 0.0 - 0.1 K/uL  Immature Granulocytes 0 %   Abs Immature Granulocytes 0.03 0.00 - 0.07 K/uL    Comment: Performed at [facility], [place] [address] [place], [place] [address]  Comprehensive metabolic panel     Status: Abnormal   Collection Time: 12/03/21  2:19 PM  Result Value Ref Range   Sodium 138 135 - 145 mmol/L   Potassium 3.9 3.5 - 5.1 mmol/L   Chloride 101 98 - 111 mmol/L   CO2 28 22 - 32 mmol/L   Glucose, Bld 154 (H) 70 - 99 mg/dL    Comment: Glucose reference range applies only to samples taken after fasting for at least 8 hours.   BUN 22 8 - 23 mg/dL   Creatinine, Ser 1.86 (H) 0.61 - 1.24 mg/dL   Calcium 9.1 8.9 - 10.3 mg/dL   Total Protein 6.0 (L) 6.5 - 8.1 g/dL   Albumin 3.8 3.5 - 5.0 g/dL   AST 16 15 - 41 U/L   ALT 14 0 - 44 U/L   Alkaline Phosphatase 66 38 - 126 U/L   Total Bilirubin 0.9 0.3 - 1.2 mg/dL   GFR, Estimated 35 (L) >60 mL/min    Comment: (NOTE) Calculated using the CKD-EPI Creatinine Equation (2021)    Anion gap 9 5 - 15    Comment: Performed at [facility] [address], [place], [place] [address]  Urinalysis, Routine w reflex microscopic     Status: Abnormal   Collection Time: 12/03/21  3:00 PM  Result Value Ref Range   Color, Urine YELLOW YELLOW   APPearance HAZY (A) CLEAR   Specific Gravity, Urine 1.017 1.005 - 1.030   pH 5.0 5.0 - 8.0   Glucose, UA NEGATIVE NEGATIVE mg/dL   Hgb urine dipstick LARGE (A) NEGATIVE   Bilirubin Urine NEGATIVE NEGATIVE   Ketones, ur 5 (A) NEGATIVE mg/dL   Protein, ur NEGATIVE NEGATIVE mg/dL   Nitrite NEGATIVE NEGATIVE   Leukocytes,Ua NEGATIVE NEGATIVE   RBC / HPF >50 (H) 0 - 5 RBC/hpf   WBC, UA 6-10 0 -  5 WBC/hpf   Bacteria, UA FEW (A) NONE SEEN   Mucus PRESENT    Hyaline Casts, UA PRESENT     Comment: Performed at [facility] [address], [place], [place] [address]    Current Facility-Administered Medications  Medication Dose Route Frequency Provider Last Rate Last Admin   albuterol (VENTOLIN HFA) 108 (90 Base) MCG/ACT inhaler 2 puff  2 puff Inhalation Q6H PRN Suella Broad A, PA-C       amiodarone (PACERONE) tablet 100 mg  100 mg Oral Daily Suella Broad A, PA-C   100 mg at 12/04/21 0950   atorvastatin (LIPITOR) tablet 20 mg  20 mg Oral q1800 Suella Broad A, PA-C   20 mg at 12/03/21 1816   Rivaroxaban (XARELTO) tablet 15 mg  15 mg Oral Q supper Tacy Learn, PA-C       Current Outpatient Medications  Medication Sig Dispense Refill   albuterol (VENTOLIN HFA) 108 (90 Base) MCG/ACT inhaler Inhale 2 puffs into the lungs every 6 (six) hours as needed for wheezing or shortness of breath.     amiodarone (PACERONE) 200 MG tablet Take 100 mg by mouth daily.     atorvastatin (LIPITOR) 20 MG tablet Take 1 tablet (20 mg total) by mouth daily at 6 PM. 30 tablet 0   Rivaroxaban (XARELTO) 15 MG TABS tablet Take 1 tablet (15 mg total) by mouth daily with  supper. (Patient taking differently: Take 15 mg by mouth daily.) 30 tablet 3   amiodarone (PACERONE) 100 MG tablet Take 1 tablet (100 mg total) by mouth daily. (Patient not taking: Reported on 12/03/2021) 90 tablet 3     Psychiatric Specialty Exam: Presentation  General Appearance: Appropriate for Environment  Eye Contact:Fair  Speech:Clear and Coherent  Speech Volume:Normal  Handedness:No data recorded  Mood and Affect  Mood:Euthymic  Affect:Appropriate   Thought Process  Thought Processes:Linear  Descriptions of Associations:Intact  Orientation:Full (Time, Place and Person)  Thought Content:Logical  History of Schizophrenia/Schizoaffective disorder:No data recorded Duration of Psychotic Symptoms:No data  recorded Hallucinations:Hallucinations: None  Ideas of Reference:None  Suicidal Thoughts:Suicidal Thoughts: No  Homicidal Thoughts:Homicidal Thoughts: No   Sensorium  Memory:Immediate Fair  Judgment:Fair  Insight:Fair   Executive Functions  Concentration:Fair  Attention Span:Fair  [place] Level   Psychomotor Activity  Psychomotor Activity:Psychomotor Activity: Normal   Assets  Assets:Communication Skills; Housing; Leisure Time; Social Support    Sleep  Sleep:Sleep: Fair   Physical Exam: Physical Exam Neurological:     Mental Status: He is alert.  Psychiatric:        Behavior: Behavior is cooperative.        Thought Content: Thought content normal.    Review of Systems  Psychiatric/Behavioral:  Negative for hallucinations and suicidal ideas.   All other systems reviewed and are negative.  Blood pressure (!) 158/90, pulse 68, temperature 97.6 F (36.4 C), temperature source Oral, resp. rate 16, height '5\' 8"'$  (1.727 m), weight 68.5 kg, SpO2 98 %. Body mass index is 22.96 kg/m.  Medical Decision Making: Patient has no significant psychiatric history, and feels like a new medication caused his aggression and to not "act like himself." Pt statedd since stopping that medication he has felt much better. Pt does not meet psychiatric inpatient criteria. No medication changes at this time. Denies SI/HI/AVH. Feels safe returning home. No behavioral outbursts or aggression has been noted during hospital stay.   Disposition: No evidence of imminent risk to self or others at present.   Patient does not meet criteria for psychiatric inpatient admission. Supportive therapy provided about ongoing stressors. Discussed crisis plan, support from social network, calling 911, coming to the Emergency Department, and calling Suicide Hotline. Patient does not meet criteria for IVC or inpatient psychiatric admission at this time. Pt is  psych cleared for discharge. Wife has been notified and agreed to d/c and pick up to return home. RN, LCSW, and EDP notified of disposition via secure message.  Vesta Mixer, NP 12/04/2021 12:08 PM

## 2021-12-04 NOTE — BH Assessment (Signed)
Comprehensive Clinical Assessment (CCA) Note  12/04/2021 [name] [id number]  Disposition: Erasmo Score, NP, recommends overnight observation for safety and stabilization with psych reassessment in the AM. Monique, RN, informed of disposition.  The patient demonstrates the following risk factors for suicide: Chronic risk factors for suicide include: N/A. Acute risk factors for suicide include: family or marital conflict. Protective factors for this patient include: positive social support, responsibility to others (children, family), coping skills, and hope for the future. Considering these factors, the overall suicide risk at this point appears to be low. Patient is not appropriate for outpatient follow up.  [place] ED from 12/03/2021 in [place] Admission (Discharged) from 11/12/2021 in [place] No Risk No Risk      Richard Stanley is a 86 year old male presenting voluntary to [facility] due to aggressive behaviors towards family members. Patient denied SI, HI, psychosis and alcohol/drug usage. Per triage note, police report family called because patient has been aggressive and having increased anger but thinks its because of his BP medications are not working. When asked patient, why are you here, patient stated, "my daughter and son-in-law were being unkind. Patient reported being frustrated that his daughter and son-in-law moved into his home. Patient denied history of psych hospitalizations, suicide attempts and self-harming behaviors. Patient reported 10 plus hours of sleep daily and normal appetite. Patient denied receiving any outpatient mental health services. Patient denied being prescribed any psych medications.   Collateral contact, Robt Okuda, wife, [phone], consent received from patient. Wife reported patient threatened to kill her and daughter. Wife reported patient did not share a  plan. Wife reported patient went "bazurk" on Fathers Day. Wife reported patient continues to make statements "all my loved ones are dead, I wish I had died with my twin sister". Wife reported patients twin sister died 42 years ago. Wife reported patient has 15 siblings and he is one of 3 that are currently still living. Wife reported daughter cooks heart healthy meals for patient and son-in-law is responsible for dispensing medications to patient. Wife feels unsafe, stating "what if he goes bazurk again and then tries to kill me". Wife feels uncomfortable with patient being discharged today.   Chief Complaint:  Chief Complaint  Patient presents with   Medical Clearance   Hypertension   Suicidal   Homicidal   Visit Diagnosis:  Major depressive disorder    CCA Screening, Triage and Referral (STR)  Patient Reported Information How did you hear about [place]? Family/Friend  What Is the Reason for Your Visit/Call Today? SI and HI no plan.  How Long Has This Been Causing You Problems? 1 wk - 1 month  What Do You Feel Would Help You the Most Today? -- ("I want to go home")   Have You Recently Had Any Thoughts About Hurting Yourself? No  Are You Planning to Commit Suicide/Harm Yourself At This time? No   Have you Recently Had Thoughts About [place]? No  Are You Planning to Harm Someone at This Time? No  Explanation: No data recorded  Have You Used Any Alcohol or Drugs in the Past 24 Hours? No  How Long Ago Did You Use Drugs or Alcohol? No data recorded What Did You Use and How Much? No data recorded  Do You Currently Have a Therapist/Psychiatrist? No  Name of Therapist/Psychiatrist: No data recorded  Have You Been Recently Discharged From Any Office Practice or  Programs? No  Explanation of Discharge From Practice/Program: No data recorded    CCA Screening Triage Referral Assessment Type of Contact: Tele-Assessment  Telemedicine Service Delivery:   Is this Initial  or Reassessment? Initial Assessment  Date Telepsych consult ordered in [facility]:  12/03/21  Time Telepsych consult ordered in [facility]:  1710  Location of Assessment: [facility] ED  Provider Location: [facility] Assessment Services   Collateral Involvement: Normand Damron, wife, [phone]   Does Patient Have a Court Appointed Legal Guardian? No data recorded Name and Contact of Legal Guardian: No data recorded If Minor and Not Living with Parent(s), Who has Custody? No data recorded Is CPS involved or ever been involved? No data recorded Is APS involved or ever been involved? No data recorded  Patient Determined To Be At Risk for Harm To Self or Others Based on Review of Patient Reported Information or Presenting Complaint? No data recorded Method: No data recorded Availability of Means: No data recorded Intent: No data recorded Notification Required: No data recorded Additional Information for Danger to Others Potential: No data recorded Additional Comments for Danger to Others Potential: No data recorded Are There Guns or Other Weapons in Your Home? No data recorded Types of Guns/Weapons: No data recorded Are These Weapons Safely Secured?                            No data recorded Who Could Verify You Are Able To Have These Secured: No data recorded Do You Have any Outstanding Charges, Pending Court Dates, Parole/Probation? No data recorded Contacted To Inform of Risk of Harm To Self or Others: No data recorded   Does Patient Present under Involuntary Commitment? No  IVC Papers Initial File Date: No data recorded  [place] of Residence: [place]   Patient Currently Receiving the Following Services: Not Receiving Services   Determination of Need: Urgent (48 hours)   Options For Referral: Outpatient Therapy     CCA Biopsychosocial Patient Reported Schizophrenia/Schizoaffective Diagnosis in Past: No data recorded  Strengths: self-awareness   Mental Health Symptoms Depression:    Hopelessness   Duration of Depressive symptoms:  Duration of Depressive Symptoms: Greater than two weeks   Mania:   None   Anxiety:    None   Psychosis:   None   Duration of Psychotic symptoms:    Trauma:   None   Obsessions:   None   Compulsions:   None   Inattention:   None   Hyperactivity/Impulsivity:   None   Oppositional/Defiant Behaviors:   None   Emotional Irregularity:   None   Other Mood/Personality Symptoms:  No data recorded   Mental Status Exam Appearance and self-care  Stature:   Average   Weight:   Average weight   Clothing:   Neat/clean   Grooming:   Normal   Cosmetic use:   None   Posture/gait:   Normal   Motor activity:   Not Remarkable   Sensorium  Attention:   Normal   Concentration:   Normal   Orientation:   X5   Recall/memory:   Normal   Affect and Mood  Affect:   Appropriate   Mood:   Depressed   Relating  Eye contact:   Normal   Facial expression:   Responsive   Attitude toward examiner:   Cooperative   Thought and Language  Speech flow:  Soft; Slow; Articulation error   Thought content:   Appropriate to  Mood and Circumstances   Preoccupation:   None   Hallucinations:   None   Organization:  No data recorded  [employer] of Knowledge:   Average   Intelligence:   Average   Abstraction:   Functional   Judgement:   Fair   Reality Testing:  No data recorded  Insight:   Lacking   Decision Making:  No data recorded  Social Functioning  Social Maturity:   Responsible   Social Judgement:   Naive   Stress  Stressors:   Family conflict   Coping Ability:   Overwhelmed; Exhausted   Skill Deficits:   Decision making; Self-control; Communication   Supports:   Family     Religion:    Leisure/Recreation: Leisure / Recreation Do You Have Hobbies?: Yes Leisure and Hobbies: going to yard sales  Exercise/Diet: Exercise/Diet Do You Exercise?:   (uta) Do You Follow a Special Diet?: Yes Type of Diet: heart healthy diet Do You Have Any Trouble Sleeping?: No   CCA Employment/Education Employment/Work Situation: Employment / Work Situation Employment Situation: Retired  Education: Education Is Patient Currently Attending School?: No Last Grade Completed: 7 Did You Nutritional therapist?: No   CCA Family/Childhood History Family and Relationship History: Family history Marital status: Married Number of Years Married: 76 What types of issues is patient dealing with in the relationship?: uta Additional relationship information: uta Does patient have children?: Yes How many children?:  (uta)  Childhood History:  Childhood History By whom was/is the patient raised?:  (uta) Did patient suffer any verbal/emotional/physical/sexual abuse as a child?: No Did patient suffer from severe childhood neglect?: No Has patient ever been sexually abused/assaulted/raped as an adolescent or adult?: No  Child/Adolescent Assessment:     CCA Substance Use Alcohol/Drug Use: Alcohol / Drug Use Pain Medications: see MAR Prescriptions: see MAR Over the Counter: see MAR History of alcohol / drug use?: No history of alcohol / drug abuse                         ASAM's:  Six Dimensions of Multidimensional Assessment  Dimension 1:  Acute Intoxication and/or Withdrawal Potential:      Dimension 2:  Biomedical Conditions and Complications:      Dimension 3:  Emotional, Behavioral, or Cognitive Conditions and Complications:     Dimension 4:  Readiness to Change:     Dimension 5:  Relapse, Continued use, or Continued Problem Potential:     Dimension 6:  Recovery/Living Environment:     ASAM Severity Score:    ASAM Recommended Level of Treatment:     Substance use Disorder (SUD)    Recommendations for Services/Supports/Treatments: Recommendations for Services/Supports/Treatments Recommendations For Services/Supports/Treatments:  Medication Management, Individual Therapy  Discharge Disposition:    DSM5 Diagnoses: Patient Active Problem List   Diagnosis Date Noted   COPD (chronic obstructive pulmonary disease) ([place]) 08/17/2017   Fall 08/17/2017   Closed right ankle fracture 08/17/2017   Leukocytosis 08/17/2017   HLD (hyperlipidemia)    Closed fracture of right distal fibula    Essential hypertension    Preventative health care 08/27/2011   CKD (chronic kidney disease), stage III ([place]) 08/27/2011   Atrial fibrillation with RVR ([place]) 05/15/2011   Thyroid nodule 10/01/2010   Rash 10/01/2010   Asthma 09/26/2010   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

## 2021-12-04 NOTE — ED Notes (Signed)
ON site TTS done

## 2021-12-04 NOTE — Discharge Instructions (Signed)
Substance Abuse Treatment Programs ° °Intensive Outpatient Programs °[facility]     °[address]      °[place], [place]                   °[phone]      ° °[facility] °[address] °[place], [place] °[phone] ° °[facility]     °(Inpatient and outpatient)     °[address]           °[phone]   ° °[facility] °336-288-1484 (Suboxone and Methadone) ° °[address]      °[place], [place] [address]      °336-882-2125      ° °[address] °[place], [place] °852-3033 ° °[facility] (Outpatient/Inpatient, Chemical)    °(insurance only) [phone]      °       °[facility] (Groups & Residential) °[place], [place] °336-389-1413 ° °   °[facility]     °[address]     °[place], [place]      °[phone]      ° °[facility] (for caregivers and family) °[address] °[place], [place] °336-299-4655 ° ° ° ° ° °Residential Treatment Programs °[facility]      °[address], [place], [place] [address]  °([phone]      ° °[facility] °[address] [place], [place] [address] °[phone] ° °[facility]        °336-248-8914      ° °[facility] °1-800-659-3381 ° °[facility]             °[address]                                         °[place], [place]                                                °877-615-2722 or 336-784-9470                              ° °[facility] [place] °[address] °[place], [address] °1.877.941.8954 ° °[facility]    °[address]      °[place], [place]     °[phone]      ° °[facility] °[address] °, [place] °336-285-9073 ° °[facility]   °[address]     °[place], [place] [address]     °[phone]      °Admissions: 8am-3pm M-F ° °[facility] °[address] °[place],  [place] °336-227-7417 ° °[facility]: Residential Program (90 Days)   °[place], [place]      °336-725-8389 or 800-758-6077    ° °[facility] °[place],  °(Walk in Hours over the weekend or by referral) ° °[facility] °[address], [place],  [address] °([phone] ° °Crisis Mobile: [facility]:  1-877-626-1772 (for crisis response 24 hours a day) °[facility] Hotline:      1-800-256-2452 °Outpatient Psychiatry and Counseling ° °[facility]: Mobile Crisis   Management 24 hours:  1-877-626-1772 ° °[facility] sliding scale fee and walk in schedule: M-F 8am-12pm/1pm-3pm °[address]  °[place], [place] [address] °[phone] ° °[facility] °[address] °[place], [place] [address] °336-703-9650 ° °[facility] ([facility])- new patient walk-in appointments available Monday - Friday 8am -3pm.          °[address] °[place], [place] [address] °336-676-6840 or crisis line- 336-676-6905 ° ° [facility]/ Intensive Outpatient Therapy Program °[address] °[place], [place] [address] °336-832-9804 ° °[facility]                  °Crisis Services      °[phone]      °[address]     °[place], [place] [address]                ° °[facility]   °[facility] °[phone] °[address] °[place], [place] [address] ° ° °[facility]          °[address],  °[place], [place] [address]       °([phone] ° °[facility] °[address] °[place], [place] [address] °336-292-1510 ° °[facility]    °[address]    °[place], [place] [address]     °[phone]      ° °Parish McKinney, MD     °[address]     °[place] [address]     °336-282-1251     °  °[facility] °[address] °[place] [address] ° °[facility]     °[address]     °[place], [place]      °336-542-2076      ° °[facility] °Shamsher Ahluwalia, MD °[address] °[place], [place] [address] °336-420-9558 ° °[facility]     °[address]     °[place], [place] [address]     °336-274-1237      ° °[facility] °[address] °[place], [place] [address] °336-854-4450 °Resources for Temporary Residential Assistance/Crisis Centers ° °DAY CENTERS °[facility] °M-F 8am-3pm   °[address] [place], [place] [address]   336-332-0824 °Services include: laundry, barbering, support groups, case management, phone  & computer access, showers, AA/NA mtgs, mental health/substance abuse nurse, job skills class, disability information, VA assistance, spiritual classes, etc.  ° °HOMELESS SHELTERS ° °[facility]     °[facility]   °[address], [place]     °[phone]       °       °[facility] (women and children)       °[address] °, [place] [address] °336-275-0820 °[email or web address] for application and process °Application Required ° °[facility]   °[address]    °[place] [address]     °[phone]       °             °[facility] °[address] °, [place] [address] °336.273.5572 °336-235-0363(schedule application appt.) °Application Required ° °[facility] (women only)    °[address]     °[place],  [address]     °336-884-1039      °  Intake starts 6pm daily °Need valid ID, SSC, & Police report °[facility] [place] °[address] °[place], [place] °336-881-5420 °Application Required ° °[facility] (men only)     °[address]      °[place], [place]     °[phone]      ° °[facility] °(Pregnant women only) °[address] °[place], [place] °336-275-0206 ° °[facility]      °[address]      °[place], [place] [address]     °[phone]      °       °[facility] °[address] °[place], [place] °336-723-1848 °90 day commitment/SA/Application process ° °[facility](men only)     °[address]     °[place], [place]     °[phone]       °Check-in at 7pm     °       °[facility] °[address] °[place], [place] [address] °336-248-6684 °Men/Women/Women and Children must be there by 7 pm ° °[facility] °[place], [place] °336-722-8721                ° °

## 2021-12-05 NOTE — H&P (Signed)
Primary Physician/Referring:  [name], NP  Patient ID: Richard Stanley, male    DOB: [birth year], 86 y.o.   MRN: [id number]  Chief Complaint  Patient presents with   Atrial Fibrillation   Follow-up    6 weeks   HPI:    Richard Stanley  is a 86 y.o. male with history of hypertension, hyperlipidemia, asthma, chronic kidney disease stage III, COPD.  Patient diagnosed with atrial fibrillation in March 2019.  He was hospitalized in March 2023 with A-fib with RVR and has subsequently followed up with our office.    Patient was last seen in the office 10/11/2021 at which time he remained in atrial fibrillation and symptomatic despite up titration of AV nodal blocking agents and addition of amiodarone.  Decision was therefore to proceed with direct-current cardioversion.  Also at last office visit patient and his family opted out regarding further ischemic evaluation.  Patient underwent suc Continues to tolerate anticoagulation without bleeding diathesis.  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   HYPERLIPIDEMIA 06/18/2009   Nodule of right lung    Other diseases of lung, not elsewhere classified 06/18/2009   Rash 10/01/2010   Thyroid nodule 10/01/2010   Past Surgical History:  Procedure Laterality Date   CARDIAC CATHETERIZATION  1998   normal per pt   CARDIOVERSION N/A 11/12/2021   Procedure: CARDIOVERSION;  Surgeon: Adrian Prows, MD;  Location: [facility];  Service: Cardiovascular;  Laterality: N/A;   Family History  Problem Relation Age of Onset   Colon cancer Father    Cancer Father        colon    Other Sister        died after knee surgery    Social History   Tobacco Use   Smoking status: Former    Packs/day: 1.00    Years: 38.00    Total pack years: 38.00    Types: Cigarettes    Quit date: 06/09/1989    Years since quitting: 32.4   Smokeless tobacco: Never   Tobacco comments:    Also smoke pipe in 1969 to 1991, several times a day  Substance Use Topics   Alcohol use: No   Marital Status: Married   ROS  Review of Systems  Cardiovascular:  Positive for dyspnea on exertion (improved). Negative for chest pain, leg swelling, palpitations, paroxysmal nocturnal dyspnea and syncope.  Neurological:  Negative for dizziness.    Objective  Blood pressure 114/75, pulse (!) 55, temperature 98 F (36.7 C), temperature source Temporal, resp. rate 16, height '5\' 8"'$  (1.727 m), weight 156 lb 9.6 oz (71 kg).  Vitals with BMI 02/10/2020 02/22/2018 02/22/2018  Height '5\' 8"'$  - -  Weight 208 lbs - -  BMI 81.44 - -  Systolic 818 563 149  Diastolic 70 702 95  Pulse 80 88 100   Orthostatic VS for the past 72 hrs (Last 3 readings):  Patient Position BP Location Cuff Size  11/22/21 1006 Sitting Left Arm Normal     Physical Exam Vitals reviewed.  Constitutional:      General: He is not  in acute distress. Neck:     Vascular: No carotid bruit or JVD.  Cardiovascular:     Rate and Rhythm: Bradycardia present. Rhythm irregular.     Pulses: Intact distal pulses.     Heart sounds: S1 normal and S2 normal. Heart sounds are distant. No murmur heard. Pulmonary:     Effort: Pulmonary effort is normal. No respiratory distress.     Breath sounds: Wheezing (scattered) present.  Abdominal:     General: Bowel sounds are normal.     Palpations: Abdomen is soft.  Musculoskeletal:     Right lower leg: No edema.     Left lower leg: No edema.  Neurological:     Mental Status: He is alert.     Laboratory examination:   Recent Labs    11/12/21 1008  NA 141  K 3.8  CL 102  GLUCOSE 98  BUN  27*  CREATININE 1.80*   estimated creatinine clearance is 29.6 mL/min (A) (by C-G formula based on SCr of 1.8 mg/dL (H)).     Latest Ref Rng & Units 11/12/2021   10:08 AM 02/22/2018    3:30 PM 08/17/2017    9:40 AM  CMP  Glucose 70 - 99 mg/dL 98  112  137   BUN 8 - 23 mg/dL '27  18  25   '$ Creatinine 0.61 - 1.24 mg/dL 1.80  1.64  1.77   Sodium 135 - 145 mmol/L 141  141  138   Potassium 3.5 - 5.1 mmol/L 3.8  4.1  3.9   Chloride 98 - 111 mmol/L 102  103  105   CO2 22 - 32 mmol/L  28  25   Calcium 8.9 - 10.3 mg/dL  9.2  8.2       Latest Ref Rng & Units 11/12/2021   10:08 AM 02/22/2018    3:30 PM 08/17/2017    9:40 AM  CBC  WBC 4.0 - 10.5 K/uL  11.4  8.1   Hemoglobin 13.0 - 17.0 g/dL 17.0  16.2  15.1   Hematocrit 39.0 - 52.0 % 50.0  50.5  46.7   Platelets 150 - 400 K/uL  251  164     Lipid Panel No results for input(s): "CHOL", "TRIG", "LDLCALC", "VLDL", "HDL", "CHOLHDL", "LDLDIRECT" in the last 8760 hours.  HEMOGLOBIN A1C Lab Results  Component Value Date   HGBA1C 5.4 08/17/2017   MPG 108.28 08/17/2017   TSH No results for input(s): "TSH" in the last 8760 hours.   External Labs:  08/29/2021: Total cholesterol 143, triglycerides 117, HDL 52, LDL 70 BUN 39, creatinine 1.3, GFR 51, sodium 146, potassium 4.6 Hgb 16.4, HCT 40.8, MCV 89, platelet 319  08/16/2021: BNP 473 TSH 7.74  02/09/2020:  Glucose 126, BUN 19, creatinine 1.36, GFR 48, CMP otherwise within normal limits. CBC within normal limits Triglycerides 255, HDL 45, LDL 63, non-HDL 104, total 149 proBNP 169  Allergies  No Known Allergies  Final Medications at End of Visit    No current facility-administered medications for this encounter.  Current Outpatient Medications:    albuterol (VENTOLIN HFA) 108 (90 Base) MCG/ACT inhaler, Inhale 2 puffs into the lungs every 6 (six) hours as needed for wheezing or shortness of breath., Disp: , Rfl:    amiodarone (PACERONE) 100 MG tablet, Take 1 tablet (100 mg total) by  mouth daily. (Patient not taking: Reported on 12/03/2021), Disp: 90 tablet, Rfl: 3   atorvastatin (LIPITOR) 20 MG tablet, Take 1 tablet (20 mg total)  by mouth daily at 6 PM., Disp: 30 tablet, Rfl: 0   amiodarone (PACERONE) 200 MG tablet, Take 100 mg by mouth daily., Disp: , Rfl:    Rivaroxaban (XARELTO) 15 MG TABS tablet, Take 1 tablet (15 mg total) by mouth daily with supper. (Patient taking differently: Take 15 mg by mouth daily.), Disp: 30 tablet, Rfl: 3  Radiology:   No results found.  Cardiac Studies:  Direct current cardioversion 11/12/2021 10:35 AM  Indication symptomatic A. Fibrillation.  Procedure: Using 60 mg of IV Propofol and 60 IV Lidocaine (for reducing venous pain) for achieving deep sedation, synchronized direct current cardioversion performed. Patient was delivered with 120 x1, 150x1 then 200 Joules of electricity X 1 with success to NSR. Patient tolerated the procedure well. No immediate complication noted.   Echocardiogram 08/13/2021: Mild calcification of the ascending aorta. Left Ventricle is normal in size, normal wall thickness, LVEF 50-55%.  LV systolic function is borderline reduced with borderline global hypokinesis of the LV.  RV is normal in size and systolic function is mildly reduced. Right ventricular systolic pressure is normal.   Echocardiogram 08/17/2017:  Left ventricle: Cavity size was normal. Wall thickness was normal. Systolic function was normal. The estimated ejection fraction was in the range of 60% to 65%. Wall motion was normal; there were no regional wall abnormalities. Doppler parameters are consistent with abnormal LV relaxation (grade 1 diastolic dysfunction). Right atrium: mildly dilate.   EKG   09/23/2021: Atrial fibrillation with controlled ventricular response at a rate of 97 bpm.  Nonspecific T wave abnormality.  Left axis.   08/30/2021: Atrial fibrillation with controlled ventricular response at a rate of 104 bpm.  Nonspecific T wave  abnormality.  02/10/2020: Normal sinus rhythm at a rate of 77 bpm, normal axis.  Nonspecific T wave abnormality in the anterior leads.   Assessment  Persistent atrial fibrillation with symptomatic dyspnea on exertion and fatigue. This patients CHA2DS2-VASc Score 3 (HTN, Age) and yearly risk of stroke 3.2%.   Recommendations:   HABEEB PUERTAS is a 86 y.o. male with history of hypertension, hyperlipidemia, asthma, chronic kidney disease stage III, COPD.  Patient diagnosed with atrial fibrillation in March 2019.  He was hospitalized in March 2023 with A-fib with RVR and has subsequently followed up with our office.    Reviewed the records, discussed with the patient again on the day of the procedure regarding proceeding with cardioversion.  He is willing to proceed.  All questions answered.   Alethia Berthold, PA-C 11/22/2021, 11:39 AM Office: [phone]

## 2022-02-21 ENCOUNTER — Ambulatory Visit: Payer: Medicare HMO | Admitting: Student

## 2022-02-21 ENCOUNTER — Ambulatory Visit: Payer: Medicare HMO | Admitting: Cardiology

## 2022-02-26 ENCOUNTER — Other Ambulatory Visit: Payer: Self-pay

## 2022-02-26 MED ORDER — ATORVASTATIN CALCIUM 20 MG PO TABS: 20.0000 mg | ORAL_TABLET | Freq: Every day | ORAL | 6 refills | Status: DC

## 2022-03-02 ENCOUNTER — Inpatient Hospital Stay (HOSPITAL_COMMUNITY)
Admission: EM | Admit: 2022-03-02 | Discharge: 2022-03-07 | DRG: 871 | Disposition: A | Discharge status: Home / Self Care | Payer: Medicare HMO | Attending: Internal Medicine | Admitting: Internal Medicine

## 2022-03-02 ENCOUNTER — Other Ambulatory Visit: Payer: Self-pay

## 2022-03-02 ENCOUNTER — Emergency Department (HOSPITAL_COMMUNITY): Payer: Medicare HMO

## 2022-03-02 ENCOUNTER — Encounter (HOSPITAL_COMMUNITY): Payer: Self-pay | Admitting: *Deleted

## 2022-03-02 DIAGNOSIS — N1832 Chronic kidney disease, stage 3b: Secondary | ICD-10-CM | POA: Diagnosis present

## 2022-03-02 DIAGNOSIS — E785 Hyperlipidemia, unspecified: Secondary | ICD-10-CM | POA: Diagnosis present

## 2022-03-02 DIAGNOSIS — J9601 Acute respiratory failure with hypoxia: Secondary | ICD-10-CM | POA: Diagnosis present

## 2022-03-02 DIAGNOSIS — R6520 Severe sepsis without septic shock: Secondary | ICD-10-CM | POA: Diagnosis present

## 2022-03-02 DIAGNOSIS — N179 Acute kidney failure, unspecified: Secondary | ICD-10-CM | POA: Diagnosis present

## 2022-03-02 DIAGNOSIS — I10 Essential (primary) hypertension: Secondary | ICD-10-CM | POA: Diagnosis present

## 2022-03-02 DIAGNOSIS — A419 Sepsis, unspecified organism: Secondary | ICD-10-CM | POA: Diagnosis present

## 2022-03-02 DIAGNOSIS — N183 Chronic kidney disease, stage 3 unspecified: Secondary | ICD-10-CM | POA: Diagnosis present

## 2022-03-02 DIAGNOSIS — J440 Chronic obstructive pulmonary disease with acute lower respiratory infection: Secondary | ICD-10-CM | POA: Diagnosis present

## 2022-03-02 DIAGNOSIS — Z20822 Contact with and (suspected) exposure to covid-19: Secondary | ICD-10-CM | POA: Diagnosis present

## 2022-03-02 DIAGNOSIS — I482 Chronic atrial fibrillation, unspecified: Secondary | ICD-10-CM | POA: Diagnosis present

## 2022-03-02 DIAGNOSIS — Z87891 Personal history of nicotine dependence: Secondary | ICD-10-CM

## 2022-03-02 DIAGNOSIS — I129 Hypertensive chronic kidney disease with stage 1 through stage 4 chronic kidney disease, or unspecified chronic kidney disease: Secondary | ICD-10-CM | POA: Diagnosis present

## 2022-03-02 DIAGNOSIS — F418 Other specified anxiety disorders: Secondary | ICD-10-CM | POA: Diagnosis present

## 2022-03-02 DIAGNOSIS — J189 Pneumonia, unspecified organism: Secondary | ICD-10-CM | POA: Diagnosis present

## 2022-03-02 DIAGNOSIS — E860 Dehydration: Secondary | ICD-10-CM | POA: Diagnosis present

## 2022-03-02 DIAGNOSIS — F39 Unspecified mood [affective] disorder: Secondary | ICD-10-CM | POA: Diagnosis present

## 2022-03-02 DIAGNOSIS — Z79899 Other long term (current) drug therapy: Secondary | ICD-10-CM | POA: Diagnosis not present

## 2022-03-02 DIAGNOSIS — Z7901 Long term (current) use of anticoagulants: Secondary | ICD-10-CM

## 2022-03-02 LAB — URINALYSIS, ROUTINE W REFLEX MICROSCOPIC
Bacteria, UA: NONE SEEN
Bilirubin Urine: NEGATIVE
Glucose, UA: NEGATIVE mg/dL
Ketones, ur: NEGATIVE mg/dL
Leukocytes,Ua: NEGATIVE
Nitrite: NEGATIVE
Protein, ur: NEGATIVE mg/dL
Specific Gravity, Urine: 1.014 (ref 1.005–1.030)
pH: 5 (ref 5.0–8.0)

## 2022-03-02 LAB — PROTIME-INR
INR: 3.1 — ABNORMAL HIGH (ref 0.8–1.2)
Prothrombin Time: 31.4 seconds — ABNORMAL HIGH (ref 11.4–15.2)

## 2022-03-02 LAB — COMPREHENSIVE METABOLIC PANEL
ALT: 129 U/L — ABNORMAL HIGH (ref 0–44)
AST: 65 U/L — ABNORMAL HIGH (ref 15–41)
Albumin: 2.6 g/dL — ABNORMAL LOW (ref 3.5–5.0)
Alkaline Phosphatase: 59 U/L (ref 38–126)
Anion gap: 12 (ref 5–15)
BUN: 36 mg/dL — ABNORMAL HIGH (ref 8–23)
CO2: 22 mmol/L (ref 22–32)
Calcium: 8 mg/dL — ABNORMAL LOW (ref 8.9–10.3)
Chloride: 101 mmol/L (ref 98–111)
Creatinine, Ser: 1.94 mg/dL — ABNORMAL HIGH (ref 0.61–1.24)
GFR, Estimated: 34 mL/min — ABNORMAL LOW (ref >60)
Glucose, Bld: 139 mg/dL — ABNORMAL HIGH (ref 70–99)
Potassium: 4 mmol/L (ref 3.5–5.1)
Sodium: 135 mmol/L (ref 135–145)
Total Bilirubin: 0.9 mg/dL (ref 0.3–1.2)
Total Protein: 5.2 g/dL — ABNORMAL LOW (ref 6.5–8.1)

## 2022-03-02 LAB — CBC WITH DIFFERENTIAL/PLATELET
Abs Immature Granulocytes: 0.08 10*3/uL — ABNORMAL HIGH (ref 0.00–0.07)
Basophils Absolute: 0 10*3/uL (ref 0.0–0.1)
Basophils Relative: 0 %
Eosinophils Absolute: 0 10*3/uL (ref 0.0–0.5)
Eosinophils Relative: 0 %
HCT: 40.2 % (ref 39.0–52.0)
Hemoglobin: 13 g/dL (ref 13.0–17.0)
Immature Granulocytes: 1 %
Lymphocytes Relative: 1 %
Lymphs Abs: 0.2 10*3/uL — ABNORMAL LOW (ref 0.7–4.0)
MCH: 31 pg (ref 26.0–34.0)
MCHC: 32.3 g/dL (ref 30.0–36.0)
MCV: 95.7 fL (ref 80.0–100.0)
Monocytes Absolute: 0.8 10*3/uL (ref 0.1–1.0)
Monocytes Relative: 6 %
Neutro Abs: 13.4 10*3/uL — ABNORMAL HIGH (ref 1.7–7.7)
Neutrophils Relative %: 92 %
Platelets: 187 10*3/uL (ref 150–400)
RBC: 4.2 MIL/uL — ABNORMAL LOW (ref 4.22–5.81)
RDW: 12.3 % (ref 11.5–15.5)
Smear Review: NORMAL
WBC: 14.5 10*3/uL — ABNORMAL HIGH (ref 4.0–10.5)
nRBC: 0 % (ref 0.0–0.2)

## 2022-03-02 LAB — PROCALCITONIN: Procalcitonin: 11.81 ng/mL

## 2022-03-02 LAB — LACTIC ACID, PLASMA
Lactic Acid, Venous: 2.5 mmol/L (ref 0.5–1.9)
Lactic Acid, Venous: 1.7 mmol/L (ref 0.5–1.9)

## 2022-03-02 LAB — RESP PANEL BY RT-PCR (FLU A&B, COVID) ARPGX2
Influenza A by PCR: NEGATIVE
Influenza B by PCR: NEGATIVE
SARS Coronavirus 2 by RT PCR: NEGATIVE

## 2022-03-02 LAB — APTT: aPTT: 47 seconds — ABNORMAL HIGH (ref 24–36)

## 2022-03-02 MED ORDER — OXYCODONE HCL 5 MG PO TABS: 5.0000 mg | ORAL_TABLET | ORAL | Status: DC | PRN

## 2022-03-02 MED ORDER — SODIUM CHLORIDE 0.9 % IV SOLN: INTRAVENOUS | Status: DC

## 2022-03-02 MED ORDER — FLUOXETINE HCL 20 MG PO CAPS: 20.0000 mg | ORAL_CAPSULE | Freq: Every day | ORAL | Status: DC

## 2022-03-02 MED ORDER — HYDRALAZINE HCL 20 MG/ML IJ SOLN: 5.0000 mg | INTRAMUSCULAR | Status: DC | PRN

## 2022-03-02 MED ORDER — ONDANSETRON HCL 4 MG/2ML IJ SOLN: 4.0000 mg | Freq: Four times a day (QID) | INTRAMUSCULAR | Status: DC | PRN

## 2022-03-02 MED ORDER — SODIUM CHLORIDE 0.9 % IV SOLN
2.0000 g | INTRAVENOUS | Status: AC
  Administered 2022-03-02 – 2022-03-06 (×5): 2 g via INTRAVENOUS
  Filled 2022-03-02 (×5): qty 20

## 2022-03-02 MED ORDER — RIVAROXABAN 15 MG PO TABS
15.0000 mg | ORAL_TABLET | Freq: Every day | ORAL | Status: DC
  Administered 2022-03-02 – 2022-03-06 (×5): 15 mg via ORAL
  Filled 2022-03-02 (×5): qty 1

## 2022-03-02 MED ORDER — ATORVASTATIN CALCIUM 10 MG PO TABS
20.0000 mg | ORAL_TABLET | Freq: Every day | ORAL | Status: DC
  Administered 2022-03-02 – 2022-03-06 (×5): 20 mg via ORAL
  Filled 2022-03-02 (×6): qty 2

## 2022-03-02 MED ORDER — POLYETHYLENE GLYCOL 3350 17 G PO PACK: 17.0000 g | PACK | Freq: Every day | ORAL | Status: DC | PRN

## 2022-03-02 MED ORDER — ALBUTEROL SULFATE (2.5 MG/3ML) 0.083% IN NEBU
2.5000 mg | INHALATION_SOLUTION | RESPIRATORY_TRACT | Status: DC | PRN
  Administered 2022-03-05: 2.5 mg via RESPIRATORY_TRACT
  Filled 2022-03-02: qty 3

## 2022-03-02 MED ORDER — AMIODARONE HCL 200 MG PO TABS
100.0000 mg | ORAL_TABLET | Freq: Every day | ORAL | Status: DC
  Administered 2022-03-02 – 2022-03-06 (×5): 100 mg via ORAL
  Filled 2022-03-02 (×5): qty 1

## 2022-03-02 MED ORDER — SODIUM CHLORIDE 0.9% FLUSH
3.0000 mL | Freq: Two times a day (BID) | INTRAVENOUS | Status: DC
  Administered 2022-03-02 – 2022-03-06 (×7): 3 mL via INTRAVENOUS

## 2022-03-02 MED ORDER — ACETAMINOPHEN 650 MG RE SUPP: 650.0000 mg | Freq: Four times a day (QID) | RECTAL | Status: DC | PRN

## 2022-03-02 MED ORDER — GUAIFENESIN ER 600 MG PO TB12
600.0000 mg | ORAL_TABLET | Freq: Two times a day (BID) | ORAL | Status: DC | PRN
  Administered 2022-03-05 – 2022-03-06 (×3): 600 mg via ORAL
  Filled 2022-03-02 (×3): qty 1

## 2022-03-02 MED ORDER — BISACODYL 5 MG PO TBEC: 5.0000 mg | DELAYED_RELEASE_TABLET | Freq: Every day | ORAL | Status: DC | PRN

## 2022-03-02 MED ORDER — LACTATED RINGERS IV BOLUS (SEPSIS)
1000.0000 mL | Freq: Once | INTRAVENOUS | Status: AC
  Administered 2022-03-02: 1000 mL via INTRAVENOUS

## 2022-03-02 MED ORDER — ACETAMINOPHEN 325 MG PO TABS
650.0000 mg | ORAL_TABLET | Freq: Four times a day (QID) | ORAL | Status: DC | PRN
  Administered 2022-03-05: 650 mg via ORAL
  Filled 2022-03-02: qty 2

## 2022-03-02 MED ORDER — DOCUSATE SODIUM 100 MG PO CAPS
100.0000 mg | ORAL_CAPSULE | Freq: Two times a day (BID) | ORAL | Status: DC
  Administered 2022-03-03 – 2022-03-06 (×7): 100 mg via ORAL
  Filled 2022-03-02 (×9): qty 1

## 2022-03-02 MED ORDER — SODIUM CHLORIDE 0.9 % IV SOLN
500.0000 mg | INTRAVENOUS | Status: AC
  Administered 2022-03-02 – 2022-03-06 (×5): 500 mg via INTRAVENOUS
  Filled 2022-03-02 (×6): qty 5

## 2022-03-02 MED ORDER — ONDANSETRON HCL 4 MG PO TABS: 4.0000 mg | ORAL_TABLET | Freq: Four times a day (QID) | ORAL | Status: DC | PRN

## 2022-03-02 MED ORDER — FLUOXETINE HCL 20 MG PO CAPS
20.0000 mg | ORAL_CAPSULE | Freq: Every day | ORAL | Status: DC
  Administered 2022-03-02 – 2022-03-06 (×5): 20 mg via ORAL
  Filled 2022-03-02 (×5): qty 1

## 2022-03-02 MED ORDER — LACTATED RINGERS IV SOLN: INTRAVENOUS | Status: DC

## 2022-03-02 NOTE — ED Notes (Signed)
Running nss with rocephin

## 2022-03-02 NOTE — ED Triage Notes (Signed)
This pt arrived by [facility] from home he has not been feeling well for several days  the wife told ems that the pt had been exposed ti covid at a school.  C/o a bad cough weakness   bp low at  the house  ems gave him 550m nss on the way here.  Alert and oriented  skin warm and dry.    He is hard of hearing

## 2022-03-02 NOTE — H&P (Signed)
History and Physical    Patient: Richard Stanley OFB:[id number] DOB: 1936/02/26 DOA: 03/02/2022 DOS: the patient was seen and examined on 03/02/2022 PCP: [name], NP  Patient coming from: Home - lives with wife, daughter, SIL, grandson; NOK: Wife, Taos Tapp, [phone]   Chief Complaint: Cough, weakness  HPI: Richard Stanley is a 86 y.o. male with medical history significant of afib on Xarelto; HTN; anxiety/depression; stage 3 CKD; COPD; and HLD presenting with cough/weakness.  He reports that he has been weak and "swimmy headed".  He fell several times because he was just too weak to stand.  Denies SOB but O2 sats in 80s when not on O2.  +cough, productive.  No fever.  His wife reports that he had a cold.  No COVID contacts - but someone at his grandson's school had it.  The rest of the family is sick with "colds."    ER Course:  Carryover, per Dr. Nevada Crane:  86 year old male with history of atrial fibrillation on Xarelto presents with progressive cough and generalized weakness.  Found to be hypoxic requiring 2 L to maintain O2 saturation greater than 90% with pneumonia.  Started on empiric antibiotics for CAP.     Review of Systems: As mentioned in the history of present illness. All other systems reviewed and are negative. 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 His smoking use included cigarettes. He has a 38.00 pack-year smoking history. He has never used smokeless tobacco. He reports that he does not drink alcohol and does not use drugs.  No Known Allergies  Family History  Problem Relation Age of Onset   Colon cancer Father    Cancer Father        colon   Other Sister        died after knee surgery    Prior to Admission medications   Medication Sig Start Date End Date Taking? Authorizing Provider  albuterol (VENTOLIN HFA) 108 (90 Base) MCG/ACT inhaler Inhale 2 puffs into the lungs every 6 (six) hours as needed for wheezing or shortness of breath.    [provider]  amiodarone (PACERONE) 100 MG tablet Take 1 tablet (100 mg total) by mouth daily. Patient not taking: Reported on 12/03/2021 09/23/21   Alethia Berthold, PA-C  amiodarone (PACERONE) 200 MG tablet Take 100 mg by mouth daily.    [provider]  atorvastatin (LIPITOR) 20 MG tablet Take 1 tablet (20 mg total) by mouth daily at 6 PM. 02/26/22   Adrian Prows, MD  Rivaroxaban (XARELTO) 15 MG TABS tablet Take 1 tablet (15  mg total) by mouth daily with supper. Patient taking differently: Take 15 mg by mouth daily. 11/22/21   Alethia Berthold, PA-C    Physical Exam: Vitals:   03/02/22 0553 03/02/22 0614  BP: 105/60   Pulse: 78   Resp: (!) 28   Temp: 98.7 F (37.1 C)   TempSrc: Oral   SpO2: 100%   Weight:  68.5 kg  Height:  '5\' 8"'$  (1.727 m)   General:  Appears calm and comfortable and is in NAD, normal O2 sats when on [place] O2 Eyes:  EOMI, normal lids, iris ENT:  Hard of hearing, grossly normal lips & tongue, mmm; edentulous Neck:  no LAD, masses or thyromegaly Cardiovascular:  RRR, no m/r/g. No LE edema.   Respiratory:   CTA bilaterally with no wheezes/rales/rhonchi.  Mildly increased respiratory effort.  O2 sats are consistently in the 80s when not on supplemental O2. Abdomen:  soft, NT, ND Skin:  no rash or induration seen on limited exam Musculoskeletal:  grossly normal tone BUE/BLE, good ROM, no bony abnormality Psychiatric:  grossly normal mood and affect, speech fluent and appropriate, AOx3 Neurologic:  CN 2-12 grossly intact, moves all extremities in coordinated fashion   Radiological Exams on Admission: Independently reviewed - see discussion in A/P where applicable  DG Chest Port 1 View  Result Date: 03/02/2022 CLINICAL DATA:  86 year old male with history of shortness of breath and weakness. Possible sepsis. EXAM: PORTABLE CHEST 1 VIEW COMPARISON:  Chest x-ray 11/13/2011. FINDINGS: Lung volumes are normal. Extensive airspace consolidation is noted in the right lower lobe. Left lung is clear. No definite pleural effusions. No pneumothorax. No evidence of pulmonary edema. Heart size is normal. Upper mediastinal contours are within normal limits. Atherosclerotic calcifications are noted in the thoracic aorta. IMPRESSION: 1. Right lower lobe pneumonia. Followup PA and lateral chest X-ray is recommended in 3-4 weeks following trial of antibiotic therapy to ensure resolution and exclude underlying malignancy. 2. Aortic atherosclerosis. Electronically Signed   By: Vinnie Langton M.D.   On: 03/02/2022 06:02    EKG: Independently reviewed.  NSR with rate 76; no evidence of acute ischemia   Labs on Admission: I have personally reviewed the available labs and imaging studies at the time of the admission.  Pertinent labs:    Glucose 139 BUN 36/Creatinine 1.94/GFR 34 - stable Calcium 8.0 Albumin 2.6 AST 65/ALT 129 - normal on 6/27 Lactate 2.5 WBC 14.5 INR 3.1 Blood cultures pending COVID/flu negative   Assessment and Plan: Principal Problem:   CAP (community acquired  pneumonia) Active Problems:   Depression with anxiety   CKD (chronic kidney disease), stage III ([place])   HLD (hyperlipidemia)   Essential hypertension   Atrial fibrillation, chronic (HCC)    CAP -Patient presenting with cough, persistently decreased oxygen saturation, and infiltrate in right lower lobe on chest x-ray -This appears to be most likely community-acquired pneumonia.  -Influenza negative. -COVID-19 negative. -Sputum and blood cultures are pending -Strep and Legionella testing ordered -Will order lower respiratory tract procalcitonin level.   >0.5 indicates infection and >>0.5 indicates more serious disease.  As the procalcitonin level normalizes, it will be reasonable to consider de-escalation of antibiotic coverage.  The sensitivity of procalcitonin is variable and should not be used alone to guide treatment. -CURB-65 score is 3, meaning that the patient has a 14% risk of death -Pneumonia Severity Index (PSI) is Class 4, 9% mortality. -Will start Azithromycin 500 mg IV daily and Rocephin due to no risk factors for  MDR cause  -Additional complicating factors include: hypoxia -NS @ 75cc/hr -Repeat CBC in am -Will add albuterol PRN -Will add Mucinex for cough -Will admit and monitor on telemetry bed -Elevated lactate, will trend but he does not have SIRS criteria to suggest sepsis -Mildly elevated LFTs may indicate shock liver but again he does not appear to have SIRS criteria suggestive of sepsis; will repeat LFTs in AM  Afib -Rate controlled with amiodarone -Continue Xarelto  HLD -Continue atorvastatin  Mood d/o -Continue fluoxetine  Stage 3b CKD -Appears to be stable at this time -Attempt to avoid nephrotoxic medications -Recheck BMP in AM   COPD -Not on home meds other than prn albuterol -Does not appear to be decompensated at this time -However, if his O2 sats remain low he may need to be considered for home O2    Advance Care Planning:   Code Status:  Full Code   Consults: PT/OT  DVT Prophylaxis: Xarelto  Family Communication: None present; I spoke with his wife by telephone at the time of admission  Severity of Illness: The appropriate patient status for this patient is INPATIENT. Inpatient status is judged to be reasonable and necessary in order to provide the required intensity of service to ensure the patient's safety. The patient's presenting symptoms, physical exam findings, and initial radiographic and laboratory data in the context of their chronic comorbidities is felt to place them at high risk for further clinical deterioration. Furthermore, it is not anticipated that the patient will be medically stable for discharge from the hospital within 2 midnights of admission.   * I certify that at the point of admission it is my clinical judgment that the patient will require inpatient hospital care spanning beyond 2 midnights from the point of admission due to high intensity of service, high risk for further deterioration and high frequency of surveillance required.*  Author: Karmen Bongo, MD 03/02/2022 9:06 AM  For on call review [email or web address].

## 2022-03-02 NOTE — ED Notes (Signed)
The ot fell tonight just before he arrived

## 2022-03-02 NOTE — Progress Notes (Signed)
Pt being followed by [facility] for Sepsis protocol. 

## 2022-03-02 NOTE — Progress Notes (Signed)
Mobility Specialist Progress Note:   03/02/22 1718  Mobility  Activity Turned to left side;Turned to right side  Level of Assistance Minimal assist, patient does 75% or more  Assistive Device None  Activity Response Tolerated well  $Mobility charge 1 Mobility   Per request of RN, assisted with bed mobility to change bedding. Pt received in bed and agreeable. No complaints. Pt left in bed with RN in room. Call bell in reach and all needs met.   Richard Stanley Mobility Specialist-Acute Rehab Secure Chat only

## 2022-03-02 NOTE — Plan of Care (Signed)

## 2022-03-03 DIAGNOSIS — J189 Pneumonia, unspecified organism: Secondary | ICD-10-CM | POA: Diagnosis not present

## 2022-03-03 LAB — COMPREHENSIVE METABOLIC PANEL
ALT: 95 U/L — ABNORMAL HIGH (ref 0–44)
AST: 38 U/L (ref 15–41)
Albumin: 2.4 g/dL — ABNORMAL LOW (ref 3.5–5.0)
Alkaline Phosphatase: 57 U/L (ref 38–126)
Anion gap: 6 (ref 5–15)
BUN: 31 mg/dL — ABNORMAL HIGH (ref 8–23)
CO2: 27 mmol/L (ref 22–32)
Calcium: 8.6 mg/dL — ABNORMAL LOW (ref 8.9–10.3)
Chloride: 107 mmol/L (ref 98–111)
Creatinine, Ser: 1.71 mg/dL — ABNORMAL HIGH (ref 0.61–1.24)
GFR, Estimated: 39 mL/min — ABNORMAL LOW (ref >60)
Glucose, Bld: 94 mg/dL (ref 70–99)
Potassium: 4.3 mmol/L (ref 3.5–5.1)
Sodium: 140 mmol/L (ref 135–145)
Total Bilirubin: 0.7 mg/dL (ref 0.3–1.2)
Total Protein: 5.2 g/dL — ABNORMAL LOW (ref 6.5–8.1)

## 2022-03-03 LAB — CBC
HCT: 38.4 % — ABNORMAL LOW (ref 39.0–52.0)
Hemoglobin: 12.8 g/dL — ABNORMAL LOW (ref 13.0–17.0)
MCH: 31.2 pg (ref 26.0–34.0)
MCHC: 33.3 g/dL (ref 30.0–36.0)
MCV: 93.7 fL (ref 80.0–100.0)
Platelets: 188 10*3/uL (ref 150–400)
RBC: 4.1 MIL/uL — ABNORMAL LOW (ref 4.22–5.81)
RDW: 12.3 % (ref 11.5–15.5)
WBC: 12.4 10*3/uL — ABNORMAL HIGH (ref 4.0–10.5)
nRBC: 0 % (ref 0.0–0.2)

## 2022-03-03 LAB — URINE CULTURE: Culture: 100 — AB

## 2022-03-03 LAB — STREP PNEUMONIAE URINARY ANTIGEN: Strep Pneumo Urinary Antigen: NEGATIVE

## 2022-03-03 NOTE — Plan of Care (Signed)
  Problem: Activity: Goal: Ability to tolerate increased activity will improve Outcome: Progressing   Problem: Clinical Measurements: Goal: Ability to maintain a body temperature in the normal range will improve Outcome: Progressing   Problem: Respiratory: Goal: Ability to maintain a clear airway will improve Outcome: Progressing   Problem: Education: Goal: Knowledge of General Education information will improve Description: Including pain rating scale, medication(s)/side effects and non-pharmacologic comfort measures Outcome: Progressing

## 2022-03-03 NOTE — ED Provider Notes (Signed)
[facility] Provider Note   CSN: [id number] Arrival date & time: 03/02/22  0445     History  No chief complaint on file.   Richard Stanley is a 86 y.o. male.  Patient presents the ER from home secondary to feeling unwell and decreased alertness.  Patient states has had a cough for about a week.  Was exposed to [place].  No fevers.  Some chills but no other associated symptoms.        Home Medications Prior to Admission medications   Medication Sig Start Date End Date Taking? Authorizing Provider  amiodarone (PACERONE) 200 MG tablet Take 100 mg by mouth daily.   Yes [provider]  atorvastatin (LIPITOR) 20 MG tablet Take 1 tablet (20 mg total) by mouth daily at 6 PM. Patient taking differently: Take 20 mg by mouth daily. 02/26/22  Yes Adrian Prows, MD  FLUoxetine (PROZAC) 20 MG capsule Take 20 mg by mouth daily. 12/12/21  Yes [provider]  Rivaroxaban (XARELTO) 15 MG TABS tablet Take 1 tablet (15 mg total) by mouth daily with supper. Patient taking differently: Take 15 mg by mouth daily. 11/22/21  Yes Cantwell, Celeste C, PA-C      Allergies    Patient has no known allergies.    Review of Systems   Review of Systems  Physical Exam Updated Vital Signs BP 120/70 (BP Location: Right Arm)   Pulse 70   Temp 98.1 F (36.7 C) (Oral)   Resp 18   Ht '5\' 8"'$  (1.727 m)   Wt 68.5 kg   SpO2 91%   BMI 22.96 kg/m  Physical Exam Vitals and nursing note reviewed.  Constitutional:      Appearance: He is well-developed.  HENT:     Head: Normocephalic and atraumatic.     Mouth/Throat:     Mouth: Mucous membranes are dry.  Cardiovascular:     Rate and Rhythm: Tachycardia present.  Pulmonary:     Effort: Pulmonary effort is normal. No respiratory distress.     Breath sounds: Rales (Right lower lobe) present.  Abdominal:     General: There is no distension.  Musculoskeletal:        General: Normal range of motion.      Cervical back: Normal range of motion.  Skin:    General: Skin is warm.  Neurological:     General: No focal deficit present.     Mental Status: He is alert.     ED Results / Procedures / Treatments   Labs (all labs ordered are listed, but only abnormal results are displayed) Labs Reviewed  LACTIC ACID, PLASMA - Abnormal; Notable for the following components:      Result Value   Lactic Acid, Venous 2.5 (*)    All other components within normal limits  COMPREHENSIVE METABOLIC PANEL - Abnormal; Notable for the following components:   Glucose, Bld 139 (*)    BUN 36 (*)    Creatinine, Ser 1.94 (*)    Calcium 8.0 (*)    Total Protein 5.2 (*)    Albumin 2.6 (*)    AST 65 (*)    ALT 129 (*)    GFR, Estimated 34 (*)    All other components within normal limits  CBC WITH DIFFERENTIAL/PLATELET - Abnormal; Notable for the following components:   WBC 14.5 (*)    RBC 4.20 (*)    Neutro Abs 13.4 (*)    Lymphs Abs 0.2 (*)  Abs Immature Granulocytes 0.08 (*)    All other components within normal limits  PROTIME-INR - Abnormal; Notable for the following components:   Prothrombin Time 31.4 (*)    INR 3.1 (*)    All other components within normal limits  APTT - Abnormal; Notable for the following components:   aPTT 47 (*)    All other components within normal limits  URINALYSIS, ROUTINE W REFLEX MICROSCOPIC - Abnormal; Notable for the following components:   APPearance HAZY (*)    Hgb urine dipstick SMALL (*)    All other components within normal limits  CBC - Abnormal; Notable for the following components:   WBC 12.4 (*)    RBC 4.10 (*)    Hemoglobin 12.8 (*)    HCT 38.4 (*)    All other components within normal limits  COMPREHENSIVE METABOLIC PANEL - Abnormal; Notable for the following components:   BUN 31 (*)    Creatinine, Ser 1.71 (*)    Calcium 8.6 (*)    Total Protein 5.2 (*)    Albumin 2.4 (*)    ALT 95 (*)    GFR, Estimated 39 (*)    All other components within  normal limits  RESP PANEL BY RT-PCR (FLU A&B, COVID) ARPGX2  CULTURE, BLOOD (ROUTINE X 2)  CULTURE, BLOOD (ROUTINE X 2)  URINE CULTURE  EXPECTORATED SPUTUM ASSESSMENT W GRAM STAIN, RFLX TO RESP C  LACTIC ACID, PLASMA  PROCALCITONIN  STREP PNEUMONIAE URINARY ANTIGEN  CALCIUM, IONIZED    EKG EKG Interpretation  Date/Time:  Sunday March 02 2022 05:56:03 EDT Ventricular Rate:  76 PR Interval:  137 QRS Duration: 98 QT Interval:  399 QTC Calculation: 449 R Axis:   -67 Text Interpretation: Sinus rhythm Left axis deviation Confirmed by Merrily Pew [phone]) on 03/02/2022 6:09:49 AM  Radiology DG Chest Port 1 View  Result Date: 03/02/2022 CLINICAL DATA:  86 year old male with history of shortness of breath and weakness. Possible sepsis. EXAM: PORTABLE CHEST 1 VIEW COMPARISON:  Chest x-ray 11/13/2011. FINDINGS: Lung volumes are normal. Extensive airspace consolidation is noted in the right lower lobe. Left lung is clear. No definite pleural effusions. No pneumothorax. No evidence of pulmonary edema. Heart size is normal. Upper mediastinal contours are within normal limits. Atherosclerotic calcifications are noted in the thoracic aorta. IMPRESSION: 1. Right lower lobe pneumonia. Followup PA and lateral chest X-ray is recommended in 3-4 weeks following trial of antibiotic therapy to ensure resolution and exclude underlying malignancy. 2. Aortic atherosclerosis. Electronically Signed   By: Vinnie Langton M.D.   On: 03/02/2022 06:02    Procedures .Critical Care  Performed by: Merrily Pew, MD Authorized by: Merrily Pew, MD   Critical care provider statement:    Critical care time (minutes):  30   Critical care was necessary to treat or prevent imminent or life-threatening deterioration of the following conditions:  Sepsis   Critical care was time spent personally by me on the following activities:  Development of treatment plan with patient or surrogate, discussions with consultants,  evaluation of patient's response to treatment, examination of patient, ordering and review of laboratory studies, ordering and review of radiographic studies, ordering and performing treatments and interventions, pulse oximetry, re-evaluation of patient's condition and review of old charts     Medications Ordered in ED Medications  cefTRIAXone (ROCEPHIN) 2 g in sodium chloride 0.9 % 100 mL IVPB (2 g Intravenous New Bag/Given 03/03/22 0417)  azithromycin (ZITHROMAX) 500 mg in sodium chloride 0.9 % 250 mL  IVPB (500 mg Intravenous New Bag/Given 03/03/22 0531)  amiodarone (PACERONE) tablet 100 mg (100 mg Oral Given 03/02/22 1123)  atorvastatin (LIPITOR) tablet 20 mg (20 mg Oral Given 03/02/22 1738)  Rivaroxaban (XARELTO) tablet 15 mg (15 mg Oral Given 03/02/22 1810)  0.9 %  sodium chloride infusion ( Intravenous New Bag/Given 03/02/22 0926)  acetaminophen (TYLENOL) tablet 650 mg (has no administration in time range)    Or  acetaminophen (TYLENOL) suppository 650 mg (has no administration in time range)  docusate sodium (COLACE) capsule 100 mg (100 mg Oral Patient Refused/Not Given 03/02/22 2124)  polyethylene glycol (MIRALAX / GLYCOLAX) packet 17 g (has no administration in time range)  bisacodyl (DULCOLAX) EC tablet 5 mg (has no administration in time range)  ondansetron (ZOFRAN) tablet 4 mg (has no administration in time range)    Or  ondansetron (ZOFRAN) injection 4 mg (has no administration in time range)  albuterol (PROVENTIL) (2.5 MG/3ML) 0.083% nebulizer solution 2.5 mg (has no administration in time range)  guaiFENesin (MUCINEX) 12 hr tablet 600 mg (has no administration in time range)  hydrALAZINE (APRESOLINE) injection 5 mg (has no administration in time range)  sodium chloride flush (NS) 0.9 % injection 3 mL (3 mLs Intravenous Given 03/02/22 2124)  oxyCODONE (Oxy IR/ROXICODONE) immediate release tablet 5 mg (has no administration in time range)  FLUoxetine (PROZAC) capsule 20 mg (20 mg Oral  Given 03/02/22 1123)  lactated ringers bolus 1,000 mL (0 mLs Intravenous Stopped 03/02/22 0755)    ED Course/ Medical Decision Making/ A&P                           Medical Decision Making Amount and/or Complexity of Data Reviewed Labs: ordered. Radiology: ordered. ECG/medicine tests: ordered.  Risk Decision regarding hospitalization.   Patient with likely pneumonia with tachypnea, tachycardia and right lower lobe crackles.  Antibiotic started and code sepsis initiated.  Chest x-ray viewed by myself and interpreted as likely right-sided pneumonia.  Radiology also notes that he has some upper lobe opacities as well.  Already had appropriate antibiotics.  Labs overall reassuring blood pressure stable low suspicion for severe sepsis or septic shock but secondary to his age, sepsis status and his decreased level of consciousness (although alert) we will plan for observation, hospitalist contacted.   Final Clinical Impression(s) / ED Diagnoses Final diagnoses:  None    Rx / DC Orders ED Discharge Orders     None         Deshana Rominger, Corene Cornea, MD 03/03/22 [phone]

## 2022-03-03 NOTE — Progress Notes (Signed)
PROGRESS NOTE    Richard Stanley  YOV:[id number] DOB: [birth year] DOA: 03/02/2022 PCP: [name], NP   Brief Narrative:  This 86 years old male with PMH significant for A-fib on Xarelto, hypertension, anxiety/depression, CKD stage IIIb, COPD, and hyperlipidemia presented in the ED with C/O: Productive cough and generalized weakness.  Patient reports having symptoms for several days and had been feeling weak, denies any shortness of breath but his SPO2 was found to be 80% on room air.  His wife reported he had cold,  No recent travel or COVID contact but someone at his grandson school had COVID infection.  Patient is found to have pneumonia on chest x-ray,  started on empiric antibiotics for community-acquired pneumonia.  Assessment & Plan:   Principal Problem:   CAP (community acquired pneumonia) Active Problems:   Depression with anxiety   CKD (chronic kidney disease), stage III (HCC)   HLD (hyperlipidemia)   Essential hypertension   Atrial fibrillation, chronic (HCC)  Community-acquired pneumonia: Patient presented with productive cough, decreased oxygen saturation, and infiltrate on chest x-ray. Influenza negative, COVID-negative, Sputum and blood cultures are pending.   Follow-up strep and Legionella testing antigen.  Procalcitonin significantly elevated. 11.81 Continue empiric antibiotics (Ceftriaxone and Zithromax) for 5 days.   Continue albuterol as needed,  Continue Mucinex for cough.   Lactic acid slightly elevated but does not met sepsis criteria.  Atrial fibrillation: Heart rate is well controlled with amiodarone. Continue Xarelto.  Hyperlipidemia Continue Lipitor  Mood disorder: Continue fluoxetine  CKD stage IIIb: Serum creatinine at baseline. Avoid nephrotoxic medications. Continue gentle hydration.  COPD: Not in any acute exacerbation. Continue home inhalers.  Continue supplemental oxygen.   DVT prophylaxis: Xarelto Code Status: Full code Family  Communication: No family at bedside Disposition Plan:  Status is: Inpatient Remains inpatient appropriate because: Admitted for community-acquired pneumonia requiring IV antibiotics.   Anticipated discharge home in 1 to 2 days. Consultants:  None  Procedures: None Antimicrobials: Ceftriaxone and Zithromax  Subjective: Patient was seen and examined at bedside.  Overnight events noted.  Patient reports feeling better, appears very weak and deconditioned.  Denies any chest pain or shortness of breath.  Objective: Vitals:   03/02/22 1900 03/03/22 0514 03/03/22 0730 03/03/22 1147  BP: 131/70 120/70 (!) 161/74 (!) 150/74  Pulse: 79 70 97 87  Resp: (!) 26 18 (!) 30 20  Temp: (!) 97.5 F (36.4 C) 98.1 F (36.7 C) 98.2 F (36.8 C) 98.2 F (36.8 C)  TempSrc: Oral Oral Oral Oral  SpO2: 98% 91% 94% 95%  Weight:      Height:        Intake/Output Summary (Last 24 hours) at 03/03/2022 1204 Last data filed at 03/02/2022 2124 Gross per 24 hour  Intake 573.99 ml  Output --  Net 573.99 ml   Filed Weights   03/02/22 0614  Weight: 68.5 kg    Examination:  General exam: Appears comfortable, not in any acute distress, deconditioned Respiratory system: CTA bilaterally, no wheezing, no crackles, normal respiratory effort. Cardiovascular system: S1 & S2 heard, irregular rhythm, no murmur.   Gastrointestinal system: Abdomen is soft, non tender, non distended, BS+. Central nervous system: Alert and oriented X 2. No focal neurological deficits. Extremities: No edema, no cyanosis, no clubbing. Skin: No rashes, lesions or ulcers Psychiatry: Judgement and insight appear normal. Mood & affect appropriate.     Data Reviewed: I have personally reviewed following labs and imaging studies  CBC: Recent Labs  Lab 03/02/22 0509  03/03/22 0111  WBC 14.5* 12.4*  NEUTROABS 13.4*  --   HGB 13.0 12.8*  HCT 40.2 38.4*  MCV 95.7 93.7  PLT 187 626   Basic Metabolic Panel: Recent Labs  Lab  03/02/22 0509 03/03/22 0111  NA 135 140  K 4.0 4.3  CL 101 107  CO2 22 27  GLUCOSE 139* 94  BUN 36* 31*  CREATININE 1.94* 1.71*  CALCIUM 8.0* 8.6*   GFR: Estimated Creatinine Clearance: 31.1 mL/min (A) (by C-G formula based on SCr of 1.71 mg/dL (H)). Liver Function Tests: Recent Labs  Lab 03/02/22 0509 03/03/22 0111  AST 65* 38  ALT 129* 95*  ALKPHOS 59 57  BILITOT 0.9 0.7  PROT 5.2* 5.2*  ALBUMIN 2.6* 2.4*   No results for input(s): "LIPASE", "AMYLASE" in the last 168 hours. No results for input(s): "AMMONIA" in the last 168 hours. Coagulation Profile: Recent Labs  Lab 03/02/22 0509  INR 3.1*   Cardiac Enzymes: No results for input(s): "CKTOTAL", "CKMB", "CKMBINDEX", "TROPONINI" in the last 168 hours. BNP (last 3 results) No results for input(s): "PROBNP" in the last 8760 hours. HbA1C: No results for input(s): "HGBA1C" in the last 72 hours. CBG: No results for input(s): "GLUCAP" in the last 168 hours. Lipid Profile: No results for input(s): "CHOL", "HDL", "LDLCALC", "TRIG", "CHOLHDL", "LDLDIRECT" in the last 72 hours. Thyroid Function Tests: No results for input(s): "TSH", "T4TOTAL", "FREET4", "T3FREE", "THYROIDAB" in the last 72 hours. Anemia Panel: No results for input(s): "VITAMINB12", "FOLATE", "FERRITIN", "TIBC", "IRON", "RETICCTPCT" in the last 72 hours. Sepsis Labs: Recent Labs  Lab 03/02/22 0509 03/02/22 0923  PROCALCITON  --  11.81  LATICACIDVEN 2.5* 1.7    Recent Results (from the past 240 hour(s))  Resp Panel by RT-PCR (Flu A&B, Covid) Anterior Nasal Swab     Status: None   Collection Time: 03/02/22  4:58 AM   Specimen: Anterior Nasal Swab  Result Value Ref Range Status   SARS Coronavirus 2 by RT PCR NEGATIVE NEGATIVE Final    Comment: (NOTE) SARS-CoV-2 target nucleic acids are NOT DETECTED.  The SARS-CoV-2 RNA is generally detectable in upper respiratory specimens during the acute phase of infection. The lowest concentration of  SARS-CoV-2 viral copies this assay can detect is 138 copies/mL. A negative result does not preclude SARS-Cov-2 infection and should not be used as the sole basis for treatment or other patient management decisions. A negative result may occur with  improper specimen collection/handling, submission of specimen other than nasopharyngeal swab, presence of viral mutation(s) within the areas targeted by this assay, and inadequate number of viral copies(<138 copies/mL). A negative result must be combined with clinical observations, patient history, and epidemiological information. The expected result is Negative.  Fact Sheet for Patients:  EntrepreneurPulse.com.au  Fact Sheet for Healthcare Providers:  IncredibleEmployment.be  This test is no t yet approved or cleared by the [place] FDA and  has been authorized for detection and/or diagnosis of SARS-CoV-2 by FDA under an Emergency Use Authorization (EUA). This EUA will remain  in effect (meaning this test can be used) for the duration of the COVID-19 declaration under Section 564(b)(1) of the Act, 21 U.S.C.section 360bbb-3(b)(1), unless the authorization is terminated  or revoked sooner.       Influenza A by PCR NEGATIVE NEGATIVE Final   Influenza B by PCR NEGATIVE NEGATIVE Final    Comment: (NOTE) The Xpert Xpress SARS-CoV-2/FLU/RSV plus assay is intended as an aid in the diagnosis of influenza from Nasopharyngeal swab specimens  and should not be used as a sole basis for treatment. Nasal washings and aspirates are unacceptable for Xpert Xpress SARS-CoV-2/FLU/RSV testing.  Fact Sheet for Patients: EntrepreneurPulse.com.au  Fact Sheet for Healthcare Providers: IncredibleEmployment.be  This test is not yet approved or cleared by the [place] FDA and has been authorized for detection and/or diagnosis of SARS-CoV-2 by FDA under an Emergency Use  Authorization (EUA). This EUA will remain in effect (meaning this test can be used) for the duration of the COVID-19 declaration under Section 564(b)(1) of the Act, 21 U.S.C. section 360bbb-3(b)(1), unless the authorization is terminated or revoked.  Performed at [facility], [place] [address] [place], [place] [address]   Urine Culture     Status: Abnormal   Collection Time: 03/02/22 11:30 AM   Specimen: In/Out Cath Urine  Result Value Ref Range Status   Specimen Description IN/OUT CATH URINE  Final   Special Requests NONE  Final   Culture (A)  Final    100 COLONIES/mL STAPHYLOCOCCUS HAEMOLYTICUS CALL MICROBIOLOGY LAB IF SENSITIVITIES ARE REQUIRED. Performed at [facility], [place] [address] [place], [place] [address]    Report Status 03/03/2022 FINAL  Final    Radiology Studies: DG Chest Port 1 View  Result Date: 03/02/2022 CLINICAL DATA:  86 year old male with history of shortness of breath and weakness. Possible sepsis. EXAM: PORTABLE CHEST 1 VIEW COMPARISON:  Chest x-ray 11/13/2011. FINDINGS: Lung volumes are normal. Extensive airspace consolidation is noted in the right lower lobe. Left lung is clear. No definite pleural effusions. No pneumothorax. No evidence of pulmonary edema. Heart size is normal. Upper mediastinal contours are within normal limits. Atherosclerotic calcifications are noted in the thoracic aorta. IMPRESSION: 1. Right lower lobe pneumonia. Followup PA and lateral chest X-ray is recommended in 3-4 weeks following trial of antibiotic therapy to ensure resolution and exclude underlying malignancy. 2. Aortic atherosclerosis. Electronically Signed   By: Vinnie Langton M.D.   On: 03/02/2022 06:02    Scheduled Meds:  amiodarone  100 mg Oral Daily   atorvastatin  20 mg Oral q1800   docusate sodium  100 mg Oral BID   FLUoxetine  20 mg Oral Daily   Rivaroxaban  15 mg Oral Q supper   sodium chloride flush  3 mL Intravenous Q12H   Continuous Infusions:   sodium chloride 75 mL/hr at 03/02/22 0926   azithromycin 500 mg (03/03/22 0531)   cefTRIAXone (ROCEPHIN)  IV 2 g (03/03/22 0417)     LOS: 1 day    Time spent: 50 mins    Cotina Freedman, MD [facility]   If 7PM-7AM, please contact night-coverage

## 2022-03-03 NOTE — Evaluation (Signed)
Physical Therapy Evaluation Patient Details Name: Richard Stanley MRN: [id number] DOB: 1935/07/07 Today's Date: 03/03/2022  History of Present Illness  Pt is a 86 y.o. M who presents 03/02/2022 with cough/weakness. Admitted with CAP. Influenza negative, COVID-negative. Significant PMH: A-fib on Xarelto, HTN, anxiety/depression, CKD stage IIIb, COPD, and HLD.  Clinical Impression  Pt admitted with above. Pt extremely HOH, so difficult to obtain accurate PLOF/home set up. Attempted to call pt spouse with no answer. Pt reports his entire family is sick. Pt presents with gross weakness/debility, impaired standing balance and decreased activity tolerance. Pt requiring min-mod assist for transfers and is unable to ambulate. Desaturated to 83% on 2L O2 with mobility, but rebounded > 90% with seated rest break. Will benefit from post acute rehab to address deficits and maximize functional mobility.       Recommendations for follow up therapy are one component of a multi-disciplinary discharge planning process, led by the attending physician.  Recommendations may be updated based on patient status, additional functional criteria and insurance authorization.  Follow Up Recommendations Skilled nursing-short term rehab (<3 hours/day) Can patient physically be transported by private vehicle: No    Assistance Recommended at Discharge Frequent or constant Supervision/Assistance  Patient can return home with the following  A lot of help with walking and/or transfers;A little help with bathing/dressing/bathroom    Equipment Recommendations Other (comment) (TBA)  Recommendations for Other Services       Functional Status Assessment Patient has had a recent decline in their functional status and demonstrates the ability to make significant improvements in function in a reasonable and predictable amount of time.     Precautions / Restrictions Precautions Precautions: Fall;Other (comment) Precaution  Comments: watch O2 Restrictions Weight Bearing Restrictions: No      Mobility  Bed Mobility Overal bed mobility: Needs Assistance Bed Mobility: Supine to Sit     Supine to sit: Min guard     General bed mobility comments: No physical assist required to get to edge of bed    Transfers Overall transfer level: Needs assistance Equipment used: None Transfers: Sit to/from Stand, Bed to chair/wheelchair/BSC Sit to Stand: Min assist Stand pivot transfers: Mod assist         General transfer comment: Pt requiring minA to stand from edge of bed, but decreased ability to weight shift. Performed face to face transfer from bed to chair with moderate assist. Bilateral knee instability noted.    Ambulation/Gait               General Gait Details: unable  Stairs            Wheelchair Mobility    Modified Rankin (Stroke Patients Only)       Balance Overall balance assessment: Needs assistance Sitting-balance support: Feet supported Sitting balance-Leahy Scale: Fair     Standing balance support: Bilateral upper extremity supported Standing balance-Leahy Scale: Poor                               Pertinent Vitals/Pain Pain Assessment Pain Assessment: Faces Faces Pain Scale: No hurt    Home Living Family/patient expects to be discharged to:: Private residence Living Arrangements: Spouse/significant other;Children Available Help at Discharge: Family Type of Home: Other(Comment) (condo)       Alternate Level Stairs-Number of Steps: 32 Home Layout: Two level;1/2 bath on main level Home Equipment: Rollator (4 wheels);Cane - single point      Prior  Function Prior Level of Function : Independent/Modified Independent               ADLs Comments: reports pt wife does IADL's     Hand Dominance        Extremity/Trunk Assessment   Upper Extremity Assessment Upper Extremity Assessment: Defer to OT evaluation    Lower Extremity  Assessment Lower Extremity Assessment: Generalized weakness    Cervical / Trunk Assessment Cervical / Trunk Assessment: Kyphotic  Communication   Communication: HOH  Cognition Arousal/Alertness: Awake/alert Behavior During Therapy: WFL for tasks assessed/performed Overall Cognitive Status: Difficult to assess                                 General Comments: Extremely HOH, despite using visual cue of name tag and written message, pt repeatedly telling PT that he did not need more food. ? poor vision as well (no eye glasses present in room).        General Comments      Exercises     Assessment/Plan    PT Assessment Patient needs continued PT services  PT Problem List Decreased strength;Decreased activity tolerance;Decreased balance;Decreased mobility       PT Treatment Interventions DME instruction;Gait training;Functional mobility training;Therapeutic activities;Therapeutic exercise;Balance training;Patient/family education    PT Goals (Current goals can be found in the Care Plan section)  Acute Rehab PT Goals Patient Stated Goal: to walk PT Goal Formulation: With patient Time For Goal Achievement: 03/17/22 Potential to Achieve Goals: Good    Frequency Min 3X/week     Co-evaluation               AM-PAC PT "6 Clicks" Mobility  Outcome Measure Help needed turning from your back to your side while in a flat bed without using bedrails?: A Little Help needed moving from lying on your back to sitting on the side of a flat bed without using bedrails?: A Little Help needed moving to and from a bed to a chair (including a wheelchair)?: A Lot Help needed standing up from a chair using your arms (e.g., wheelchair or bedside chair)?: A Little Help needed to walk in hospital room?: Total Help needed climbing 3-5 steps with a railing? : Total 6 Click Score: 13    End of Session Equipment Utilized During Treatment: Gait belt;Oxygen Activity Tolerance:  Patient limited by fatigue Patient left: in chair;with call bell/phone within reach;with chair alarm set Nurse Communication: Mobility status PT Visit Diagnosis: Unsteadiness on feet (R26.81);Difficulty in walking, not elsewhere classified (R26.2);Muscle weakness (generalized) (M62.81)    Time: 9326-7124 PT Time Calculation (min) (ACUTE ONLY): 38 min   Charges:   PT Evaluation $PT Eval Moderate Complexity: 1 Mod PT Treatments $Therapeutic Activity: 23-37 mins        Wyona Almas, PT, DPT Acute Rehabilitation Services Office [phone]   Deno Etienne 03/03/2022, 4:33 PM

## 2022-03-04 DIAGNOSIS — J189 Pneumonia, unspecified organism: Secondary | ICD-10-CM | POA: Diagnosis not present

## 2022-03-04 LAB — CALCIUM, IONIZED: Calcium, Ionized, Serum: 4.9 mg/dL (ref 4.5–5.6)

## 2022-03-04 LAB — CBC
HCT: 38.7 % — ABNORMAL LOW (ref 39.0–52.0)
Hemoglobin: 12.6 g/dL — ABNORMAL LOW (ref 13.0–17.0)
MCH: 30.7 pg (ref 26.0–34.0)
MCHC: 32.6 g/dL (ref 30.0–36.0)
MCV: 94.4 fL (ref 80.0–100.0)
Platelets: 226 10*3/uL (ref 150–400)
RBC: 4.1 MIL/uL — ABNORMAL LOW (ref 4.22–5.81)
RDW: 12.4 % (ref 11.5–15.5)
WBC: 8.6 10*3/uL (ref 4.0–10.5)
nRBC: 0 % (ref 0.0–0.2)

## 2022-03-04 LAB — MAGNESIUM: Magnesium: 2 mg/dL (ref 1.7–2.4)

## 2022-03-04 LAB — BASIC METABOLIC PANEL
Anion gap: 6 (ref 5–15)
BUN: 28 mg/dL — ABNORMAL HIGH (ref 8–23)
CO2: 27 mmol/L (ref 22–32)
Calcium: 8.5 mg/dL — ABNORMAL LOW (ref 8.9–10.3)
Chloride: 105 mmol/L (ref 98–111)
Creatinine, Ser: 1.32 mg/dL — ABNORMAL HIGH (ref 0.61–1.24)
GFR, Estimated: 53 mL/min — ABNORMAL LOW (ref >60)
Glucose, Bld: 89 mg/dL (ref 70–99)
Potassium: 3.7 mmol/L (ref 3.5–5.1)
Sodium: 138 mmol/L (ref 135–145)

## 2022-03-04 LAB — PHOSPHORUS: Phosphorus: 2.3 mg/dL — ABNORMAL LOW (ref 2.5–4.6)

## 2022-03-04 NOTE — Plan of Care (Signed)

## 2022-03-04 NOTE — Progress Notes (Addendum)
PROGRESS NOTE    Richard Stanley  UYQ:[id number] DOB: November 15, 1935 DOA: 03/02/2022 PCP: [name], NP   Brief Narrative:  This 86 years old male with PMH significant for A-fib on Xarelto, hypertension, anxiety/depression, CKD stage IIIb, COPD, and hyperlipidemia presented in the ED with C/O: Productive cough and generalized weakness.  Patient reports having symptoms for several days and had been feeling weak, denies any shortness of breath but his SPO2 was found to be 80% on room air.  His wife reported he had cold,  No recent travel or COVID contact but someone at his grandson school had COVID infection.  Patient is found to have pneumonia on chest x-ray,  started on empiric antibiotics for community-acquired pneumonia.  Assessment & Plan:   Principal Problem:   CAP (community acquired pneumonia) Active Problems:   Depression with anxiety   CKD (chronic kidney disease), stage III (HCC)   HLD (hyperlipidemia)   Essential hypertension   Atrial fibrillation, chronic (HCC)  Community-acquired pneumonia: Patient presented with productive cough, decreased oxygen saturation, and infiltrate on chest x-ray. Influenza negative, COVID-negative, Sputum and blood cultures are pending.   Follow-up strep and Legionella testing antigen.  Procalcitonin significantly elevated. 11.81 Continue empiric antibiotics (Ceftriaxone and Zithromax) for 5 days.   Continue albuterol as needed,  Continue Mucinex for cough.   Lactic acid slightly elevated but does not met sepsis criteria.  Atrial fibrillation: Heart rate is well controlled with amiodarone. Continue Xarelto and amiodarone.  Hyperlipidemia Continue Lipitor  Mood disorder: Continue fluoxetine  CKD stage IIIb: Serum creatinine at baseline. Avoid nephrotoxic medications. Continue gentle hydration.  COPD: Not in any acute exacerbation. Continue home inhalers.  Continue supplemental oxygen.   DVT prophylaxis: Xarelto Code Status:  Full code Family Communication: No family at bedside Disposition Plan:  Status is: Inpatient Remains inpatient appropriate because: Admitted for community-acquired pneumonia requiring IV antibiotics.   Anticipated discharge to SNF in 1-2 days.  Consultants:  None  Procedures: None Antimicrobials: Ceftriaxone and Zithromax  Subjective: Patient was seen and examined at bedside.  Overnight events noted.   Patient reports feeling better but he appears sleepy.  RN reports he did not sleep last night. Patient appears very weak and deconditioned.   Objective: Vitals:   03/03/22 1147 03/03/22 2210 03/04/22 0440 03/04/22 0821  BP: (!) 150/74 (!) 152/75 (!) 148/73 127/65  Pulse: 87 76 75 (!) 57  Resp: '20 18 18   ' Temp: 98.2 F (36.8 C) (!) 97.5 F (36.4 C) 98.2 F (36.8 C) 97.6 F (36.4 C)  TempSrc: Oral Oral Oral Oral  SpO2: 95% 95% 92% 97%  Weight:      Height:       No intake or output data in the 24 hours ending 03/04/22 1151  Filed Weights   03/02/22 0614  Weight: 68.5 kg    Examination:  General exam: Appears comfortable, not in any acute distress, deconditioned. Respiratory system: CTA bilaterally, no wheezing, no crackles, normal respiratory effort. Cardiovascular system: S1 & S2 heard, irregular rhythm, no murmur. Gastrointestinal system: Abdomen is soft, non tender, non distended, BS+. Central nervous system: Alert and oriented X 2. No focal neurological deficits. Extremities: No edema, no cyanosis, no clubbing. Skin: No rashes, lesions or ulcers Psychiatry: Judgement and insight appear normal. Mood & affect appropriate.     Data Reviewed: I have personally reviewed following labs and imaging studies  CBC: Recent Labs  Lab 03/02/22 0509 03/03/22 0111 03/04/22 0155  WBC 14.5* 12.4* 8.6  NEUTROABS 13.4*  --   --  HGB 13.0 12.8* 12.6*  HCT 40.2 38.4* 38.7*  MCV 95.7 93.7 94.4  PLT 187 188 416   Basic Metabolic Panel: Recent Labs  Lab  03/02/22 0509 03/03/22 0111 03/04/22 0155  NA 135 140 138  K 4.0 4.3 3.7  CL 101 107 105  CO2 '22 27 27  ' GLUCOSE 139* 94 89  BUN 36* 31* 28*  CREATININE 1.94* 1.71* 1.32*  CALCIUM 8.0* 8.6* 8.5*  MG  --   --  2.0  PHOS  --   --  2.3*   GFR: Estimated Creatinine Clearance: 40.3 mL/min (A) (by C-G formula based on SCr of 1.32 mg/dL (H)). Liver Function Tests: Recent Labs  Lab 03/02/22 0509 03/03/22 0111  AST 65* 38  ALT 129* 95*  ALKPHOS 59 57  BILITOT 0.9 0.7  PROT 5.2* 5.2*  ALBUMIN 2.6* 2.4*   No results for input(s): "LIPASE", "AMYLASE" in the last 168 hours. No results for input(s): "AMMONIA" in the last 168 hours. Coagulation Profile: Recent Labs  Lab 03/02/22 0509  INR 3.1*   Cardiac Enzymes: No results for input(s): "CKTOTAL", "CKMB", "CKMBINDEX", "TROPONINI" in the last 168 hours. BNP (last 3 results) No results for input(s): "PROBNP" in the last 8760 hours. HbA1C: No results for input(s): "HGBA1C" in the last 72 hours. CBG: No results for input(s): "GLUCAP" in the last 168 hours. Lipid Profile: No results for input(s): "CHOL", "HDL", "LDLCALC", "TRIG", "CHOLHDL", "LDLDIRECT" in the last 72 hours. Thyroid Function Tests: No results for input(s): "TSH", "T4TOTAL", "FREET4", "T3FREE", "THYROIDAB" in the last 72 hours. Anemia Panel: No results for input(s): "VITAMINB12", "FOLATE", "FERRITIN", "TIBC", "IRON", "RETICCTPCT" in the last 72 hours. Sepsis Labs: Recent Labs  Lab 03/02/22 0509 03/02/22 0923  PROCALCITON  --  11.81  LATICACIDVEN 2.5* 1.7    Recent Results (from the past 240 hour(s))  Resp Panel by RT-PCR (Flu A&B, Covid) Anterior Nasal Swab     Status: None   Collection Time: 03/02/22  4:58 AM   Specimen: Anterior Nasal Swab  Result Value Ref Range Status   SARS Coronavirus 2 by RT PCR NEGATIVE NEGATIVE Final    Comment: (NOTE) SARS-CoV-2 target nucleic acids are NOT DETECTED.  The SARS-CoV-2 RNA is generally detectable in upper  respiratory specimens during the acute phase of infection. The lowest concentration of SARS-CoV-2 viral copies this assay can detect is 138 copies/mL. A negative result does not preclude SARS-Cov-2 infection and should not be used as the sole basis for treatment or other patient management decisions. A negative result may occur with  improper specimen collection/handling, submission of specimen other than nasopharyngeal swab, presence of viral mutation(s) within the areas targeted by this assay, and inadequate number of viral copies(<138 copies/mL). A negative result must be combined with clinical observations, patient history, and epidemiological information. The expected result is Negative.  Fact Sheet for Patients:  EntrepreneurPulse.com.au  Fact Sheet for Healthcare Providers:  IncredibleEmployment.be  This test is no t yet approved or cleared by the [place] FDA and  has been authorized for detection and/or diagnosis of SARS-CoV-2 by FDA under an Emergency Use Authorization (EUA). This EUA will remain  in effect (meaning this test can be used) for the duration of the COVID-19 declaration under Section 564(b)(1) of the Act, 21 U.S.C.section 360bbb-3(b)(1), unless the authorization is terminated  or revoked sooner.       Influenza A by PCR NEGATIVE NEGATIVE Final   Influenza B by PCR NEGATIVE NEGATIVE Final    Comment: (NOTE) The  Xpert Xpress SARS-CoV-2/FLU/RSV plus assay is intended as an aid in the diagnosis of influenza from Nasopharyngeal swab specimens and should not be used as a sole basis for treatment. Nasal washings and aspirates are unacceptable for Xpert Xpress SARS-CoV-2/FLU/RSV testing.  Fact Sheet for Patients: EntrepreneurPulse.com.au  Fact Sheet for Healthcare Providers: IncredibleEmployment.be  This test is not yet approved or cleared by the [place] FDA and has been  authorized for detection and/or diagnosis of SARS-CoV-2 by FDA under an Emergency Use Authorization (EUA). This EUA will remain in effect (meaning this test can be used) for the duration of the COVID-19 declaration under Section 564(b)(1) of the Act, 21 U.S.C. section 360bbb-3(b)(1), unless the authorization is terminated or revoked.  Performed at [facility], [place] [address] [place], [place] [address]   Blood Culture (routine x 2)     Status: None (Preliminary result)   Collection Time: 03/02/22  5:12 AM   Specimen: BLOOD LEFT HAND  Result Value Ref Range Status   Specimen Description BLOOD LEFT HAND  Final   Special Requests   Final    BOTTLES DRAWN AEROBIC AND ANAEROBIC Blood Culture adequate volume   Culture   Final    NO GROWTH 2 DAYS Performed at [facility], [place] [address], [place], [place] [address]    Report Status PENDING  Incomplete  Blood Culture (routine x 2)     Status: None (Preliminary result)   Collection Time: 03/02/22  5:16 AM   Specimen: BLOOD RIGHT HAND  Result Value Ref Range Status   Specimen Description BLOOD RIGHT HAND  Final   Special Requests   Final    BOTTLES DRAWN AEROBIC AND ANAEROBIC Blood Culture adequate volume   Culture   Final    NO GROWTH 2 DAYS Performed at [facility], [place] [address] [place], [place] [address]    Report Status PENDING  Incomplete  Urine Culture     Status: Abnormal   Collection Time: 03/02/22 11:30 AM   Specimen: In/Out Cath Urine  Result Value Ref Range Status   Specimen Description IN/OUT CATH URINE  Final   Special Requests NONE  Final   Culture (A)  Final    100 COLONIES/mL STAPHYLOCOCCUS HAEMOLYTICUS CALL MICROBIOLOGY LAB IF SENSITIVITIES ARE REQUIRED. Performed at [facility], [place] [address] [place], [place] [address]    Report Status 03/03/2022 FINAL  Final    Radiology Studies: No results found.  Scheduled Meds:  amiodarone  100 mg Oral Daily   atorvastatin  20 mg Oral  q1800   docusate sodium  100 mg Oral BID   FLUoxetine  20 mg Oral Daily   Rivaroxaban  15 mg Oral Q supper   sodium chloride flush  3 mL Intravenous Q12H   Continuous Infusions:  sodium chloride 75 mL/hr at 03/02/22 0926   azithromycin 500 mg (03/04/22 0530)   cefTRIAXone (ROCEPHIN)  IV 2 g (03/04/22 0450)     LOS: 2 days    Time spent: 35 mins    Nolie Bignell, MD [facility]   If 7PM-7AM, please contact night-coverage

## 2022-03-04 NOTE — Evaluation (Signed)
Occupational Therapy Evaluation Patient Details Name: Richard Stanley MRN: [id number] DOB: 1936/02/08 Today's Date: 03/04/2022   History of Present Illness Pt is a 86 y.o. M who presents 03/02/2022 with cough/weakness. Admitted with CAP. Influenza negative, COVID-negative. Significant PMH: A-fib on Xarelto, HTN, anxiety/depression, CKD stage IIIb, COPD, and HLD.   Clinical Impression   Pt admitted with the above diagnosis and has the deficits listed below. Pt would benefit from cont OT to increase independence with basic adls and adl transfers so he can d/c home with his wife. At this time, feel pt may need some rehab prior to returning home as wife cares for grandson at times as well. Wife stated pt was almost modified independent with adls until he got sick the past few weeks. Family does not have a car at this time as it does not work and they cannot fix it.  Wife walks to grocery store across the street and often leaves pt at home.  SNF may be best option if 24 hour assist not available.      Recommendations for follow up therapy are one component of a multi-disciplinary discharge planning process, led by the attending physician.  Recommendations may be updated based on patient status, additional functional criteria and insurance authorization.   Follow Up Recommendations  Skilled nursing-short term rehab (<3 hours/day)    Assistance Recommended at Discharge Intermittent Supervision/Assistance  Patient can return home with the following A little help with walking and/or transfers;A little help with bathing/dressing/bathroom;Assistance with cooking/housework;Direct supervision/assist for medications management;Direct supervision/assist for financial management;Assist for transportation;Help with stairs or ramp for entrance    Functional Status Assessment  Patient has had a recent decline in their functional status and demonstrates the ability to make significant improvements in function in a  reasonable and predictable amount of time.  Equipment Recommendations  Other (comment) (tbd)    Recommendations for Other Services       Precautions / Restrictions Precautions Precautions: Fall;Other (comment) Precaution Comments: watch O2 sats. Pt's wife states he has not fallen at home until he got sick with pneumonia. He has fallen 3x since being sick. Restrictions Weight Bearing Restrictions: No      Mobility Bed Mobility Overal bed mobility: Needs Assistance Bed Mobility: Supine to Sit     Supine to sit: Min guard     General bed mobility comments: pt used bed rails and HOB up to 30 degrees    Transfers Overall transfer level: Needs assistance Equipment used: Rolling walker (2 wheels) Transfers: Sit to/from Stand, Bed to chair/wheelchair/BSC Sit to Stand: Min assist Stand pivot transfers: Mod assist   Step pivot transfers: Mod assist     General transfer comment: Pt appears unbalanced and weak when pivoting to chair but utilized walker well. Pt uses rollator out in community at home and cane inside the house when feeling unsteady.      Balance Overall balance assessment: Needs assistance Sitting-balance support: Feet supported Sitting balance-Leahy Scale: Good     Standing balance support: Bilateral upper extremity supported Standing balance-Leahy Scale: Poor Standing balance comment: pt reliant on outside assist                           ADL either performed or assessed with clinical judgement   ADL Overall ADL's : Needs assistance/impaired Eating/Feeding: Set up;Sitting   Grooming: Wash/dry face;Wash/dry hands;Oral care;Minimal assistance;Sitting   Upper Body Bathing: Minimal assistance;Sitting   Lower Body Bathing: Moderate assistance;Sit to/from  stand;Cueing for compensatory techniques   Upper Body Dressing : Minimal assistance;Sitting   Lower Body Dressing: Moderate assistance;Sit to/from stand;Cueing for compensatory techniques    Toilet Transfer: Minimal assistance;Stand-pivot;Rolling walker (2 wheels);BSC/3in1   Toileting- Clothing Manipulation and Hygiene: Moderate assistance;Sit to/from stand;Cueing for compensatory techniques       Functional mobility during ADLs: Minimal assistance;Rolling walker (2 wheels) General ADL Comments: Pt limited by safety concerns, general weakness, possible cognitive deficits and decreased balance when up making pt a significant fall risk at  this time.     Vision Baseline Vision/History: 1 Wears glasses Ability to See in Adequate Light: 1 Impaired Patient Visual Report: Other (comment) (unsure. Pt did read clock on wall well) Vision Assessment?: Vision impaired- to be further tested in functional context Additional Comments: PT could read clock on wall but unable to follow cues for other visual testing     Perception     Praxis      Pertinent Vitals/Pain Pain Assessment Pain Assessment: No/denies pain     Hand Dominance Right   Extremity/Trunk Assessment Upper Extremity Assessment Upper Extremity Assessment: Overall WFL for tasks assessed   Lower Extremity Assessment Lower Extremity Assessment: Defer to PT evaluation   Cervical / Trunk Assessment Cervical / Trunk Assessment: Kyphotic   Communication Communication Communication: HOH   Cognition Arousal/Alertness: Awake/alert Behavior During Therapy: WFL for tasks assessed/performed Overall Cognitive Status: Difficult to assess                                 General Comments: Pt is very HOH. Difficult to determine what is cognitive and what is from being so [facility]. Pt's wife states daughter does all the finances in home and pt sometimes knows the date but not always. Pt was talking to people in the room that were not there and did not know he was at [facility].     General Comments  Pt limited with LE adls, balance and cognition.    Exercises     Shoulder Instructions      Home Living  Family/patient expects to be discharged to:: Private residence Living Arrangements: Spouse/significant other;Children Available Help at Discharge: Family;Available 24 hours/day Type of Home: Other(Comment) (condo) Home Access: Stairs to enter Entrance Stairs-Number of Steps: 5 Entrance Stairs-Rails: Right Home Layout: Two level;1/2 bath on main level Alternate Level Stairs-Number of Steps: 13   Bathroom Shower/Tub: Tub/shower unit;Curtain   Biochemist, clinical: Standard     Home Equipment: Rollator (4 wheels);Cane - single point   Additional Comments: information obtained from wife as pt very HOH      Prior Functioning/Environment Prior Level of Function : Independent/Modified Independent             Mobility Comments: used cane inside the last week or so due to 3 falls. No falls before feeling poorly ADLs Comments: pt required assist last two weeks while not feeling well but otherwise does basic adls on his own. Wife does IADLs. Pt does not drive. They walk to [employer] across the street bc care broke down and dont have funds to fix it.        OT Problem List: Decreased activity tolerance;Impaired balance (sitting and/or standing);Impaired vision/perception;Decreased cognition;Decreased knowledge of use of DME or AE;Decreased safety awareness;Cardiopulmonary status limiting activity      OT Treatment/Interventions: Self-care/ADL training;Therapeutic activities;Balance training    OT Goals(Current goals can be found in the care plan section) Acute  Rehab OT Goals Patient Stated Goal: none stated OT Goal Formulation: With family Time For Goal Achievement: 03/18/22 Potential to Achieve Goals: Fair ADL Goals Pt Will Perform Grooming: with min guard assist;standing Pt Will Perform Lower Body Bathing: with min guard assist;sit to/from stand Pt Will Perform Lower Body Dressing: with min guard assist;sit to/from stand Pt Will Transfer to Toilet: with min guard  assist;ambulating;grab bars Pt Will Perform Toileting - Clothing Manipulation and hygiene: with supervision;sit to/from stand;sitting/lateral leans Pt Will Perform Tub/Shower Transfer: Tub transfer;with min assist;rolling walker;ambulating  OT Frequency: Min 2X/week    Co-evaluation              AM-PAC OT "6 Clicks" Daily Activity     Outcome Measure Help from another person eating meals?: A Little Help from another person taking care of personal grooming?: A Little Help from another person toileting, which includes using toliet, bedpan, or urinal?: A Lot Help from another person bathing (including washing, rinsing, drying)?: A Lot Help from another person to put on and taking off regular upper body clothing?: A Little Help from another person to put on and taking off regular lower body clothing?: A Lot 6 Click Score: 15   End of Session Equipment Utilized During Treatment: Rolling walker (2 wheels);Oxygen Nurse Communication: Mobility status  Activity Tolerance: Patient limited by fatigue Patient left: in chair;with call bell/phone within reach;Other (comment) (two chair alarm boxes in room and did not work. No other boxes on floor. TAlked to nursing and nursing aware.)  OT Visit Diagnosis: Unsteadiness on feet (R26.81)                Time: 0300-9233 OT Time Calculation (min): 22 min Charges:  OT General Charges $OT Visit: 1 Visit OT Evaluation $OT Eval Moderate Complexity: 1 Mod  Glenford Peers 03/04/2022, 11:05 AM

## 2022-03-04 NOTE — TOC Initial Note (Addendum)
Transition of Care [facility]) - Initial/Assessment Note    Patient Details  Name: Richard Stanley MRN: [id number] Date of Birth: [birth year]  [facility] CM/SW Contact:    Joanne Chars, LCSW Phone Number: 03/04/2022, 1:27 PM  Clinical Narrative:     Pt oriented x2.  CSW spoke briefly with pt in room and then spoke with wife Manuela Schwartz by phone.  Discussed PT recommendations for SNF. Pt lives with wife and also with daughter Hermina Staggers and her husband, does have support in the home.  No current services.  Pt is not vaccinated for covid.  Manuela Schwartz would like to discuss this with her daughter before making a decision.  Daughter at work, she will speak to her and call CSW back.           [id number]: TC call from [employer].  She called right back, said she had spoken to daughter Hermina Staggers and daughter said they could make it work at home.  CSW discussed [place] services--Susan said that Hermina Staggers does not think [place] is necessary either, that they would like a "plan" for what needs to be done and they can provide it without outside assistance.  She also had questions about pt receiving meals, as pt tells her he is not getting breakfast or lunch.    Expected Discharge Plan: [place] Barriers to Discharge: Continued Medical Work up   Patient Goals and CMS Choice        Expected Discharge Plan and Services Expected Discharge Plan: [place] In-house Referral: Clinical Social Work   Post Acute Care Choice:  (TBD) Living arrangements for the past 2 months: [place]                                      Prior Living Arrangements/Services Living arrangements for the past 2 months: Single Family Home Lives with:: Spouse, Adult Children (daughter Hermina Staggers and son in Sports coach) Patient language and need for interpreter reviewed:: Yes        Need for Family Participation in Patient Care: Yes (Comment) Care giver support system in place?: Yes (comment) Current home  services: Other (comment) (none) Criminal Activity/Legal Involvement Pertinent to Current Situation/Hospitalization: No - Comment as needed  Activities of Daily Living      Permission Sought/Granted                  Emotional Assessment Appearance:: Appears stated age Attitude/Demeanor/Rapport: Engaged Affect (typically observed): Pleasant Orientation: : Oriented to Self, Oriented to Place Alcohol / Substance Use: Not Applicable Psych Involvement: No (comment)  Admission diagnosis:  CAP (community acquired pneumonia) [J18.9] Patient Active Problem List   Diagnosis Date Noted   CAP (community acquired pneumonia) 03/02/2022   Atrial fibrillation, chronic ([place]) 03/02/2022   Aggression 12/04/2021   COPD (chronic obstructive pulmonary disease) ([place]) 08/17/2017   Fall 08/17/2017   Closed right ankle fracture 08/17/2017   Leukocytosis 08/17/2017   HLD (hyperlipidemia)    Closed fracture of right distal fibula    Essential hypertension    Preventative health care 08/27/2011   CKD (chronic kidney disease), stage III ([place]) 08/27/2011   Atrial fibrillation with RVR ([place]) 05/15/2011   Thyroid nodule 10/01/2010   Rash 10/01/2010   Asthma 09/26/2010   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 [place] [place] [address] Phone: [phone] Fax: [phone]     Social Determinants of Health (SDOH) Interventions    Readmission Risk Interventions     No data to display

## 2022-03-05 DIAGNOSIS — J189 Pneumonia, unspecified organism: Secondary | ICD-10-CM | POA: Diagnosis not present

## 2022-03-05 LAB — CBC
HCT: 38.4 % — ABNORMAL LOW (ref 39.0–52.0)
Hemoglobin: 12.4 g/dL — ABNORMAL LOW (ref 13.0–17.0)
MCH: 30.8 pg (ref 26.0–34.0)
MCHC: 32.3 g/dL (ref 30.0–36.0)
MCV: 95.3 fL (ref 80.0–100.0)
Platelets: 234 10*3/uL (ref 150–400)
RBC: 4.03 MIL/uL — ABNORMAL LOW (ref 4.22–5.81)
RDW: 12.4 % (ref 11.5–15.5)
WBC: 7.8 10*3/uL (ref 4.0–10.5)
nRBC: 0 % (ref 0.0–0.2)

## 2022-03-05 LAB — BASIC METABOLIC PANEL
Anion gap: 12 (ref 5–15)
BUN: 25 mg/dL — ABNORMAL HIGH (ref 8–23)
CO2: 27 mmol/L (ref 22–32)
Calcium: 8.8 mg/dL — ABNORMAL LOW (ref 8.9–10.3)
Chloride: 104 mmol/L (ref 98–111)
Creatinine, Ser: 1.21 mg/dL (ref 0.61–1.24)
GFR, Estimated: 59 mL/min — ABNORMAL LOW (ref >60)
Glucose, Bld: 72 mg/dL (ref 70–99)
Potassium: 3.5 mmol/L (ref 3.5–5.1)
Sodium: 143 mmol/L (ref 135–145)

## 2022-03-05 LAB — PHOSPHORUS: Phosphorus: 2.3 mg/dL — ABNORMAL LOW (ref 2.5–4.6)

## 2022-03-05 LAB — MAGNESIUM: Magnesium: 2 mg/dL (ref 1.7–2.4)

## 2022-03-05 NOTE — Care Management Important Message (Signed)
Important Message  Patient Details  Name: Richard Stanley MRN: [id number] Date of Birth: 26-Jan-1936   Medicare Important Message Given:  Yes     [name] 03/05/2022, 1:56 PM

## 2022-03-05 NOTE — Progress Notes (Signed)
Physical Therapy Treatment Patient Details Name: Richard Stanley MRN: [id number] DOB: Mar 31, 1936 Today's Date: 03/05/2022   History of Present Illness Pt is a 86 y.o. M who presents 03/02/2022 with cough/weakness. Admitted with CAP. Influenza negative, COVID-negative. Significant PMH: A-fib on Xarelto, HTN, anxiety/depression, CKD stage IIIb, COPD, and HLD.    PT Comments    Pt progressing towards his physical therapy goals. Ambulating 50 ft with a walker and min assist (+2 safety for line management). Desat to 83% on 2LO2 during mobility; bumped to 4L O2 to maintain > 90%. Presents as a high fall risk based on decreased gait speed and history of recurrent falling. Based on deficits and gross change in baseline, still recommend SNF to maximize functional mobility.     Recommendations for follow up therapy are one component of a multi-disciplinary discharge planning process, led by the attending physician.  Recommendations may be updated based on patient status, additional functional criteria and insurance authorization.  Follow Up Recommendations  Skilled nursing-short term rehab (<3 hours/day) Can patient physically be transported by private vehicle: Yes   Assistance Recommended at Discharge Frequent or constant Supervision/Assistance  Patient can return home with the following A little help with bathing/dressing/bathroom;A little help with walking and/or transfers   Equipment Recommendations  Rolling walker (2 wheels);BSC/3in1    Recommendations for Other Services       Precautions / Restrictions Precautions Precautions: Fall;Other (comment) Precaution Comments: watch O2 sats Restrictions Weight Bearing Restrictions: No     Mobility  Bed Mobility Overal bed mobility: Needs Assistance Bed Mobility: Supine to Sit     Supine to sit: Min guard     General bed mobility comments: No physical assist required    Transfers Overall transfer level: Needs assistance Equipment  used: Rolling walker (2 wheels) Transfers: Sit to/from Stand Sit to Stand: Min assist           General transfer comment: MinA to rise and steady from edge of bed and toilet. Cues for hand placement    Ambulation/Gait Ambulation/Gait assistance: Min assist, +2 safety/equipment Gait Distance (Feet): 50 Feet Assistive device: Rolling walker (2 wheels) Gait Pattern/deviations: Step-through pattern, Decreased stride length Gait velocity: decreased Gait velocity interpretation: <1.8 ft/sec, indicate of risk for recurrent falls   General Gait Details: Modest instability, pt requiring consistent minA for balance and steering RW. +2 for line management   Stairs             Wheelchair Mobility    Modified Rankin (Stroke Patients Only)       Balance Overall balance assessment: Needs assistance Sitting-balance support: Feet supported Sitting balance-Leahy Scale: Good     Standing balance support: Bilateral upper extremity supported Standing balance-Leahy Scale: Poor Standing balance comment: pt reliant on outside assist                            Cognition Arousal/Alertness: Awake/alert Behavior During Therapy: WFL for tasks assessed/performed Overall Cognitive Status: Difficult to assess                                          Exercises      General Comments        Pertinent Vitals/Pain Pain Assessment Pain Assessment: No/denies pain    Home Living  Prior Function            PT Goals (current goals can now be found in the care plan section) Acute Rehab PT Goals Patient Stated Goal: to walk Potential to Achieve Goals: Good Progress towards PT goals: Progressing toward goals    Frequency    Min 3X/week      PT Plan Current plan remains appropriate    Co-evaluation              AM-PAC PT "6 Clicks" Mobility   Outcome Measure  Help needed turning from your back to your  side while in a flat bed without using bedrails?: A Little Help needed moving from lying on your back to sitting on the side of a flat bed without using bedrails?: A Little Help needed moving to and from a bed to a chair (including a wheelchair)?: A Little Help needed standing up from a chair using your arms (e.g., wheelchair or bedside chair)?: A Little Help needed to walk in hospital room?: A Little Help needed climbing 3-5 steps with a railing? : A Lot 6 Click Score: 17    End of Session Equipment Utilized During Treatment: Gait belt;Oxygen Activity Tolerance: Patient tolerated treatment well Patient left: in chair;with call bell/phone within reach;with chair alarm set Nurse Communication: Mobility status PT Visit Diagnosis: Unsteadiness on feet (R26.81);Difficulty in walking, not elsewhere classified (R26.2);Muscle weakness (generalized) (M62.81)     Time: 6301-6010 PT Time Calculation (min) (ACUTE ONLY): 23 min  Charges:  $Therapeutic Activity: 23-37 mins                     Wyona Almas, PT, DPT Acute Rehabilitation Services Office [phone]    Richard Stanley 03/05/2022, 12:07 PM

## 2022-03-05 NOTE — Progress Notes (Signed)
[facility] Progress Note  Patient: Richard Stanley     SKA:[id number]  DOA: 03/02/2022   PCP: [name], NP       Brief hospital course:   This 86 years old male with PMH significant for A-fib on Xarelto, hypertension, anxiety/depression, CKD stage IIIb, COPD, and hyperlipidemia presented in the ED  for a cough and weakness.  In ED, found to have pulse ox in 80s, RR in high 20s and a RLL infiltrate on CXR.  WBC was 14.5 and Cr was 1.94.   Subjective:  Continues to have a cough today.   Assessment and Plan: Principal Problem:   CAP (community acquired pneumonia) with sepsis and acute respiratory failure - cont Ceftriaxone and Azithromycin - pulse ox 83% on 2 L today  Active Problems:   Depression with anxiety   CKD (chronic kidney disease), stage IIIa(HCC) - Cr 1.94 > has improved to 1.21 - dc IVF and follow oral intake     Atrial fibrillation, chronic (HCC) - cot Amiodarone and Xarelto   DVT prophylaxis:  Eliquis   Code Status: Full Code  Consultants: none Level of Care: Level of care: Telemetry Medical   Objective:   Vitals:   03/04/22 2145 03/05/22 0015 03/05/22 0756 03/05/22 1448  BP: (!) 147/70 (!) 143/71 135/74 (!) 156/71  Pulse: 67 63 80 62  Resp: (!) 21 16    Temp:   98.7 F (37.1 C) 97.6 F (36.4 C)  TempSrc:   Oral Oral  SpO2: 91% 92% 91% 94%  Weight:      Height:       Filed Weights   03/02/22 0614  Weight: 68.5 kg   Exam: General exam: Appears comfortable  HEENT: oral mucosa moist Respiratory system: b/l rhonchi   Cardiovascular system: S1 & S2 heard  Gastrointestinal system: Abdomen soft, non-tender, nondistended. Normal bowel sounds   Extremities: No cyanosis, clubbing or edema Psychiatry:  Mood & affect appropriate.    Imaging and lab data was personally reviewed    CBC: Recent Labs  Lab 03/02/22 0509 03/03/22 0111 03/04/22 0155 03/05/22 0545  WBC 14.5* 12.4* 8.6 7.8  NEUTROABS 13.4*  --   --   --   HGB 13.0  12.8* 12.6* 12.4*  HCT 40.2 38.4* 38.7* 38.4*  MCV 95.7 93.7 94.4 95.3  PLT 187 188 226 203   Basic Metabolic Panel: Recent Labs  Lab 03/02/22 0509 03/03/22 0111 03/04/22 0155 03/05/22 0545  NA 135 140 138 143  K 4.0 4.3 3.7 3.5  CL 101 107 105 104  CO2 '22 27 27 27  '$ GLUCOSE 139* 94 89 72  BUN 36* 31* 28* 25*  CREATININE 1.94* 1.71* 1.32* 1.21  CALCIUM 8.0* 8.6* 8.5* 8.8*  MG  --   --  2.0 2.0  PHOS  --   --  2.3* 2.3*   GFR: Estimated Creatinine Clearance: 44 mL/min (by C-G formula based on SCr of 1.21 mg/dL).  Scheduled Meds:  amiodarone  100 mg Oral Daily   atorvastatin  20 mg Oral q1800   docusate sodium  100 mg Oral BID   FLUoxetine  20 mg Oral Daily   Rivaroxaban  15 mg Oral Q supper   sodium chloride flush  3 mL Intravenous Q12H   Continuous Infusions:  sodium chloride 75 mL/hr at 03/02/22 0926   azithromycin 500 mg (03/05/22 0525)   cefTRIAXone (ROCEPHIN)  IV 2 g (03/05/22 0453)     LOS: 3 days   Author: Eunice Blase  Johny Pitstick  03/05/2022 5:29 PM

## 2022-03-06 ENCOUNTER — Inpatient Hospital Stay (HOSPITAL_COMMUNITY): Payer: Medicare HMO

## 2022-03-06 DIAGNOSIS — J9601 Acute respiratory failure with hypoxia: Secondary | ICD-10-CM | POA: Diagnosis not present

## 2022-03-06 DIAGNOSIS — A419 Sepsis, unspecified organism: Secondary | ICD-10-CM | POA: Insufficient documentation

## 2022-03-06 DIAGNOSIS — J189 Pneumonia, unspecified organism: Secondary | ICD-10-CM | POA: Diagnosis not present

## 2022-03-06 MED ORDER — CEFDINIR 300 MG PO CAPS: 300.0000 mg | ORAL_CAPSULE | Freq: Two times a day (BID) | ORAL | 0 refills | Status: AC

## 2022-03-06 MED ORDER — GUAIFENESIN ER 600 MG PO TB12: 600.0000 mg | ORAL_TABLET | Freq: Two times a day (BID) | ORAL | 0 refills | Status: DC | PRN

## 2022-03-06 NOTE — Progress Notes (Signed)
Mobility Specialist Progress Note   03/06/22 1450  Mobility  Activity Ambulated with assistance in hallway  Level of Assistance Contact guard assist, steadying assist  Assistive Device Front wheel walker  Distance Ambulated (ft) 70 ft  Activity Response Tolerated well  $Mobility charge 1 Mobility   Pre Mobility: 72 HR, 96% SpO2 During Mobility: 84 HR, 89% SpO2 on 2LO2 Post Mobility: 74 HR, 187/91 BP, 98% SpO2 on 2LO2  Received in bed having c/o cough but no pain and agreeable to mobility. MinG supine>EOB but contact guard during ambulation for slight unsteadiness. Pt floating around 90% SpO2 on 2LO2 and c/o dizziness towards end of session. Checked BP once seated in room and pt's BP was elevated (187/91). Laid back supine placed call bell in reach and notified RN.     Holland Falling Mobility Specialist MS [facility] #:  [phone] Acute Rehab Office:  [phone]

## 2022-03-06 NOTE — TOC Transition Note (Addendum)
Transition of Care [facility]) - CM/SW Discharge Note   Patient Details  Name: Richard Stanley MRN: [id number] Date of Birth: June 25, 1935  Transition of Care [facility]) CM/SW Contact:  Verdell Carmine, RN Phone Number: 03/06/2022, 3:20 PM   Clinical Narrative:      Discharge order noted. Called the patients wife as he is confused , about home health and oxygen. She voiced concern regarding him discharging today. She asked if oxygen would cost anything. Referral  made to [employer], oxygen parameters in.  Explained home health, daughter also on the phone in the background. She stated she went to "school for that and do not need home health. Wife concurred, however stated that patjent is having loose stools, and as they do not have a vehicle, they wouldn have to get a Surveyor, mining. They do not want him messing the car. I asked them if they are appealing the discharge. They did not say yes or no just want him not discharged until the morning or tomorrow.   Discussed with team, have not heard from Dr. Wynelle Cleveland yet regarding DC Dr. Wynelle Cleveland confirmed that he is discharged, as long as he can obtain his oxygen before going. Called wife to let her know. She has heard from [employer]. Requesting PTAR service to take him home. Will coordinate with oxygen company so his oxygen arrives before he does. No further questions or requests  PTAR called 20 patients ahead of him. Called [employer] spoke to Lu regarding making sure his oxygen is set up at the house prior to transport. She assured me that because his liter flow is high they would be setting him up this afternoon.  18 Called wife and informed her of possible very late ambulance arrival, she was fine with it. [employer] has called them and they will be setting ixygen soon. RN aware to call dc instructions to wife  Medical neccessity form done for potential transport home.    Patient Goals and CMS Choice     Choice offered to / list presented to : Spouse  Discharge Placement                        Discharge Plan and Services In-house Referral: Clinical Social Work   Post Acute Care Choice:  (TBD)          DME Arranged: Oxygen DME Agency: [employer] Date DME Agency Contacted: 03/06/22 Time DME Agency Contacted: 1975 Representative spoke with at DME Agency: lucretia            Social Determinants of Health ([place]) Interventions     Readmission Risk Interventions     No data to display

## 2022-03-06 NOTE — Progress Notes (Signed)
Mobility Specialist Progress Note   03/06/22 1545  Mobility  Activity Ambulated with assistance to bathroom  Level of Assistance Contact guard assist, steadying assist  Assistive Device Front wheel walker  Distance Ambulated (ft) 14 ft  Activity Response Tolerated well  $Mobility charge 1 Mobility   Pt requesting assistance to get from bed to BR d/t BM urgency. Required MinG for safety d/t urgency, use of RW for transfer requiring min cues for proper RW mechanics. Successful BM. Returned back to bed w/o fault all needs met, and call bell in reach.  Holland Falling Mobility Specialist MS [facility] #:  [phone] Acute Rehab Office:  [phone]

## 2022-03-06 NOTE — Progress Notes (Signed)
SATURATION QUALIFICATIONS: (This note is used to comply with regulatory documentation for home oxygen)  Patient Saturations on Room Air at Rest = 92%  Patient Saturations on Room Air while Ambulating = 83%  Patient Saturations on 4 Liters of oxygen while Ambulating = 91%  Please briefly explain why patient needs home oxygen:  Pt unable to maintain oxygen saturation on room air without supplemental O2.  In addition when sleeping O2 sats also drop to mid 80s without supplemental 2L O2.

## 2022-03-06 NOTE — Discharge Summary (Addendum)
Physician Discharge Summary  Richard Stanley MOQ:[id number] DOB: [birth year] DOA: 03/02/2022  PCP: [name], NP  Admit date: 03/02/2022 Discharge date: 03/06/2022 Discharging to: home Recommendations for Outpatient Follow-up:  Repeat CXR in 4-6 wks Wean O2 as able- currently requiring 4 L  Consults:  none Procedures:  none   Discharge Diagnoses:   Principal Problem:   CAP (community acquired pneumonia) Active Problems:   Acute respiratory failure with hypoxia ([place])   Severe sepsis ([place])   Depression with anxiety   CKD (chronic kidney disease), stage IIIa ([place])   HLD (hyperlipidemia)   Essential hypertension   Atrial fibrillation, chronic [facility])    Hospital Course:  This 86 years old male with PMH significant for A-fib on Xarelto, hypertension, anxiety/depression, CKD stage IIIb, COPD, and hyperlipidemia presented in the ED  for a cough and weakness.  In ED, found to have pulse ox in 80s, RR in high 20s and a RLL infiltrate on CXR.  WBC was 14.5 and Cr was 1.94.   Principal Problem:   CAP (community acquired pneumonia) with severe sepsis and acute respiratory failure H/o COPD - Pro-calcitonin 11.81 - Lactic acid 2.5 - treated with Ceftriaxone and Azithromycin x 5 days- cont Cefdinir as outpt - continues to have a cough and is hypoxic on ambulation bur overall is improved - being discharged home with 4 L O2   Active Problems:      AKI with CKD (chronic kidney disease), stage IIIa(HCC) - dehydration - Cr 1.94 > has improved to 1.21- GFR is 59       Atrial fibrillation, chronic (HCC) - cot Amiodarone and Xarelto         Discharge Instructions  Discharge Instructions     Diet - low sodium heart healthy   Complete by: As directed    Increase activity slowly   Complete by: As directed       Allergies as of 03/06/2022   No Known Allergies      Medication List     TAKE these medications    amiodarone 200 MG tablet Commonly known as:  PACERONE Take 100 mg by mouth daily.   atorvastatin 20 MG tablet Commonly known as: LIPITOR Take 1 tablet (20 mg total) by mouth daily at 6 PM. What changed: when to take this   cefdinir 300 MG capsule Commonly known as: OMNICEF Take 1 capsule (300 mg total) by mouth 2 (two) times daily for 5 days.   FLUoxetine 20 MG capsule Commonly known as: PROZAC Take 20 mg by mouth daily.   guaiFENesin 600 MG 12 hr tablet Commonly known as: MUCINEX Take 1 tablet (600 mg total) by mouth 2 (two) times daily as needed for cough.   Rivaroxaban 15 MG Tabs tablet Commonly known as: XARELTO Take 1 tablet (15 mg total) by mouth daily with supper. What changed: when to take this            The results of significant diagnostics from this hospitalization (including imaging, microbiology, ancillary and laboratory) are listed below for reference.    DG Chest Port 1 View  Result Date: 03/06/2022 CLINICAL DATA:  Productive cough. Hypoxia with mild activity. Headache and shortness of breath. EXAM: PORTABLE CHEST 1 VIEW COMPARISON:  Chest x-ray dated 03/02/2022 and chest x-ray dated 11/13/2011. FINDINGS: Confluent airspace opacities persist at the RIGHT lung base, slightly improved compared to the recent chest x-ray. No new lung findings. Lungs are hyperexpanded. No pleural effusion or pneumothorax is seen. Heart size and  mediastinal contours are stable. IMPRESSION: 1. Persistent RIGHT lower lobe pneumonia, slightly improved compared to the recent chest x-ray of 03/02/2022. 2. No new lung findings. 3. Lungs are hyperexpanded indicating underlying COPD/emphysema. Electronically Signed   By: Franki Cabot M.D.   On: 03/06/2022 08:47   DG Chest Port 1 View  Result Date: 03/02/2022 CLINICAL DATA:  86 year old male with history of shortness of breath and weakness. Possible sepsis. EXAM: PORTABLE CHEST 1 VIEW COMPARISON:  Chest x-ray 11/13/2011. FINDINGS: Lung volumes are normal. Extensive airspace  consolidation is noted in the right lower lobe. Left lung is clear. No definite pleural effusions. No pneumothorax. No evidence of pulmonary edema. Heart size is normal. Upper mediastinal contours are within normal limits. Atherosclerotic calcifications are noted in the thoracic aorta. IMPRESSION: 1. Right lower lobe pneumonia. Followup PA and lateral chest X-ray is recommended in 3-4 weeks following trial of antibiotic therapy to ensure resolution and exclude underlying malignancy. 2. Aortic atherosclerosis. Electronically Signed   By: Vinnie Langton M.D.   On: 03/02/2022 06:02   Labs:   Basic Metabolic Panel: Recent Labs  Lab 03/02/22 0509 03/03/22 0111 03/04/22 0155 03/05/22 0545  NA 135 140 138 143  K 4.0 4.3 3.7 3.5  CL 101 107 105 104  CO2 '22 27 27 27  '$ GLUCOSE 139* 94 89 72  BUN 36* 31* 28* 25*  CREATININE 1.94* 1.71* 1.32* 1.21  CALCIUM 8.0* 8.6* 8.5* 8.8*  MG  --   --  2.0 2.0  PHOS  --   --  2.3* 2.3*     CBC: Recent Labs  Lab 03/02/22 0509 03/03/22 0111 03/04/22 0155 03/05/22 0545  WBC 14.5* 12.4* 8.6 7.8  NEUTROABS 13.4*  --   --   --   HGB 13.0 12.8* 12.6* 12.4*  HCT 40.2 38.4* 38.7* 38.4*  MCV 95.7 93.7 94.4 95.3  PLT 187 188 226 234         SIGNED:   Debbe Odea, MD  [facility] 03/06/2022, 12:47 PM

## 2022-03-06 NOTE — Evaluation (Signed)
Occupational Therapy Treatment Patient Details Name: Richard Stanley MRN: [id number] DOB: Aug 30, 1935 Today's Date: 03/06/2022   History of present illness Pt is a 86 y.o. M who presents 03/02/2022 with cough/weakness. Admitted with CAP. Influenza negative, COVID-negative. Significant PMH: A-fib on Xarelto, HTN, anxiety/depression, CKD stage IIIb, COPD, and HLD.   OT comments  Pt seen for OT ADL retraining session with focus on bed mobility, Mod I; sitting at EOB for grooming w/ supervision/set-up assist; bathing UB w/ set-up/supervision; LB bathing w/ min A and lateral leans and dressing UB with min guard assist and LB (socks) Min guard-min A sitting/lateral leans. Pt is HOH but able to follow commands and perform all tasks. He is overall deconditioned and tends to cough with activity and even talking/answering questions. Continue to rec SNF Rehab to assist in maximizing independence and increased activity tolerance/endurance.   Recommendations for follow up therapy are one component of a multi-disciplinary discharge planning process, led by the attending physician.  Recommendations may be updated based on patient status, additional functional criteria and insurance authorization.    Follow Up Recommendations  Skilled nursing-short term rehab (<3 hours/day)    Assistance Recommended at Discharge Intermittent Supervision/Assistance  Patient can return home with the following  A little help with walking and/or transfers;A little help with bathing/dressing/bathroom;Assistance with cooking/housework;Direct supervision/assist for medications management;Direct supervision/assist for financial management;Assist for transportation;Help with stairs or ramp for entrance   Equipment Recommendations  Other (comment) (Defer to next venue)    Recommendations for Other Services      Precautions / Restrictions Precautions Precautions: Fall;Other (comment) Precaution Comments: watch O2  sats Restrictions Weight Bearing Restrictions: No       Mobility Bed Mobility Overal bed mobility: Needs Assistance Bed Mobility: Supine to Sit, Sit to Supine     Supine to sit: Modified independent (Device/Increase time), HOB elevated Sit to supine: Modified independent (Device/Increase time)   General bed mobility comments: No physical assist required, increased time    Transfers  Pt performed ADL's sitting at EOB, x-ray arrived for testing   Balance Overall balance assessment: Needs assistance Sitting-balance support: Feet supported, No upper extremity supported Sitting balance-Leahy Scale: Good     ADL either performed or assessed with clinical judgement   ADL Overall ADL's : Needs assistance/impaired     Grooming: Wash/dry hands;Wash/dry face;Supervision/safety;Sitting;Set up   Upper Body Bathing: Set up;Sitting;Supervision/ safety Upper Body Bathing Details (indicate cue type and reason): Sitting up at EOB (min A to wash back) Lower Body Bathing: Sitting/lateral leans;Set up;Min guard Lower Body Bathing Details (indicate cue type and reason): Sitting up at EOB, Min guard when washing around feet/ankles Upper Body Dressing : Min guard;Sitting   Lower Body Dressing: Sitting/lateral leans;Minimal assistance;Min guard Lower Body Dressing Details (indicate cue type and reason): socks     General ADL Comments: Pt seen for OT ADL retraining session with focus on bed mobility, Mod I; sitting at EOB for grooming w/ supervision/set-up assist; bathing UB w/ set-up/supervision; LB bathing w/ min A and lateral leans and dressing UB with min guard assist and LB (socks) Min guard-min A sitting/lateral leans. Pt is HOH but able to follow commands and perform all tasks. He is overall deconditioned and tends to cough with activity and even talking/ansering questions. Continue to rec SNF Rehab.    Extremity/Trunk Assessment Upper Extremity Assessment Upper Extremity Assessment:  Overall WFL for tasks assessed;Generalized weakness   Lower Extremity Assessment Lower Extremity Assessment: Defer to PT evaluation   Cervical / Trunk  Assessment Cervical / Trunk Assessment: Kyphotic    Vision Baseline Vision/History: 1 Wears glasses            Cognition Arousal/Alertness: Awake/alert Behavior During Therapy: WFL for tasks assessed/performed Overall Cognitive Status: Within Functional Limits for tasks assessed (Per chart review , pt was confused with OT on 03/04/22. Not observed on 03/06/22)              General Comments Pt limited by coughing, overall decreased endurance for ADL tasks    Pertinent Vitals/ Pain       Pain Assessment Pain Assessment: No/denies pain Faces Pain Scale: No hurt  Home Living  Lives with wife, refer to OT initial Eval for details    Prior Functioning/Environment   Mod I ADL's and functional mobility prior to this admission (refer to initial OT eval for details)   Frequency  Min 2X/week        Progress Toward Goals  OT Goals(current goals can now be found in the care plan section)     Acute Rehab OT Goals Patient Stated Goal: None stated OT Goal Formulation: With patient Time For Goal Achievement: 03/18/22 Potential to Achieve Goals: [place]   SNF Rehab      AM-PAC OT "6 Clicks" Daily Activity     Outcome Measure   Help from another person eating meals?: A Little Help from another person taking care of personal grooming?: A Little Help from another person toileting, which includes using toliet, bedpan, or urinal?: A Lot Help from another person bathing (including washing, rinsing, drying)?: A Little Help from another person to put on and taking off regular upper body clothing?: A Little Help from another person to put on and taking off regular lower body clothing?: A Little 6 Click Score: 17    End of Session Equipment Utilized During Treatment: Rolling walker (2 wheels);Oxygen  OT Visit Diagnosis:  Unsteadiness on feet (R26.81)   Activity Tolerance Patient tolerated treatment well;Other (comment) (Persistant coughing during functional activity)   Patient Left in bed;with call bell/phone within reach;with bed alarm set;Other (comment) (x-ray tech in room)   Nurse Communication          Time: 660 [id number] 3410 OT Time Calculation (min): 20 min  Charges: OT General Charges $OT Visit: 1 Visit OT Treatments $Self Care/Home Management : 8-22 mins   Yaroslav Gombos Beth Dixon, OTR/L 03/06/2022, 10:36 AM

## 2022-03-07 LAB — CULTURE, BLOOD (ROUTINE X 2)
Culture: NO GROWTH
Culture: NO GROWTH
Special Requests: ADEQUATE
Special Requests: ADEQUATE

## 2022-03-07 NOTE — Progress Notes (Signed)
PTAR on unit to transport pt to home. VSS, O2, no signs of distress

## 2022-03-07 NOTE — Plan of Care (Signed)
  Problem: Respiratory: Goal: Ability to maintain adequate ventilation will improve Outcome: Adequate for Discharge Goal: Ability to maintain a clear airway will improve Outcome: Adequate for Discharge   Problem: Activity: Goal: Ability to tolerate increased activity will improve Outcome: Adequate for Discharge   Problem: Clinical Measurements: Goal: Ability to maintain a body temperature in the normal range will improve Outcome: Adequate for Discharge   Problem: Education: Goal: Knowledge of General Education information will improve Description: Including pain rating scale, medication(s)/side effects and non-pharmacologic comfort measures Outcome: Adequate for Discharge

## 2022-03-27 ENCOUNTER — Other Ambulatory Visit: Payer: Self-pay

## 2022-03-27 NOTE — Telephone Encounter (Signed)
Patient needs a refill on Amiodarone '200mg'$ . Please only fill a 47-month supply. Patient has an appointment on November 8th.

## 2022-04-16 ENCOUNTER — Ambulatory Visit: Payer: Medicare HMO | Admitting: Cardiology

## 2022-04-28 ENCOUNTER — Ambulatory Visit: Payer: Medicare HMO | Admitting: Cardiology

## 2022-04-28 ENCOUNTER — Encounter: Payer: Self-pay | Admitting: Cardiology

## 2022-04-28 VITALS — BP 127/76 | HR 73 | Temp 97.6°F | Resp 16 | Ht 68.0 in | Wt 155.6 lb

## 2022-04-28 DIAGNOSIS — I480 Paroxysmal atrial fibrillation: Secondary | ICD-10-CM

## 2022-04-28 DIAGNOSIS — N1832 Chronic kidney disease, stage 3b: Secondary | ICD-10-CM

## 2022-04-28 DIAGNOSIS — I10 Essential (primary) hypertension: Secondary | ICD-10-CM

## 2022-04-28 NOTE — Progress Notes (Signed)
Primary Physician/Referring:  [name], NP  Patient ID: Richard Stanley, male    DOB: Sep 09, 1935, 86 y.o.   MRN: [id number]  Chief Complaint  Patient presents with  . Atrial Fibrillation   HPI:    Richard Stanley  is a 85 y.o. male with history of hypertension, hyperlipidemia, asthma, chronic kidney disease stage III, COPD.  Patient diagnosed with atrial fibrillation in March 2019.  He was hospitalized in March 2023 with A-fib with RVR. Patient has haddirect current cardioversion on 11/12/2021 and since then has been maintaining sinus rhythm.    He has chronic dyspnea on exertion related to underlying COPD.  Continues to tolerate anticoagulation without bleeding diathesis.  Past Medical History:  Diagnosis Date  . ANXIETY 05/13/2010  . Asthma 09/26/2010  . Atrial fibrillation with RVR ([place]) 08/16/2017  . Benign essential HTN   . 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 CKD (chronic kidney disease), stage III ([place])   . COPD (chronic obstructive pulmonary disease) ([place])   . DEPRESSION 06/22/2009  . 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 HYPERLIPIDEMIA 06/18/2009  . Nodule of right lung   . Other diseases of lung, not elsewhere classified 06/18/2009  . Rash 10/01/2010  . Thyroid nodule 10/01/2010   Past Surgical History:  Procedure Laterality Date  . [place]   normal per pt  . CARDIOVERSION N/A 11/12/2021   Procedure: CARDIOVERSION;  Surgeon: Adrian Prows, MD;  Location: [facility] ENDOSCOPY;  Service: Cardiovascular;  Laterality: N/A;     Social History   Tobacco Use  . Smoking status: Former    Packs/day: 1.00    Years: 38.00    Total pack years: 38.00    Types: Cigarettes    Quit date: 06/09/1989    Years since quitting: 32.9  . Smokeless tobacco: Never  . Tobacco comments:    Also smoke pipe in 1969 to 1991, several times a day  Substance Use Topics  . Alcohol use: No   Marital  Status: Married   ROS  Review of Systems  Cardiovascular:  Positive for dyspnea on exertion. Negative for chest pain and leg swelling.   Objective  Blood pressure 127/76, pulse 73, temperature 97.6 F (36.4 C), temperature source Temporal, resp. rate 16, height _0  (1.727 m), weight 155 lb 9.6 oz (70.6 kg).  Vitals with BMI 02/10/2020 02/22/2018 02/22/2018  Height _1  - -  Weight 208 lbs - -  BMI 03.54 - -  Systolic 656 812 751  Diastolic 70 700 95  Pulse 80 88 100   Orthostatic VS for the past 72 hrs (Last 3 readings):  Patient Position BP Location Cuff Size  04/28/22 1324 Sitting Left Arm Normal     Physical Exam Vitals reviewed.  Neck:     Vascular: No carotid bruit or JVD.  Cardiovascular:     Rate and Rhythm: Regular rhythm. Bradycardia present.     Heart sounds: S1 normal and S2 normal. Heart sounds are distant. No murmur heard. Pulmonary:     Effort: Pulmonary effort is normal. No respiratory distress.     Breath sounds: Wheezing (Bilaterally throughout ) present.  Abdominal:     General: Bowel sounds are normal.     Palpations: Abdomen is soft.  Musculoskeletal:     Right lower leg: No edema.     Left lower leg: No edema.   Laboratory examination:   Recent Labs  03/03/22 0111 03/04/22 0155 03/05/22 0545  NA 140 138 143  K 4.3 3.7 3.5  CL 107 105 104  CO2 _0 GLUCOSE 94 89 72  BUN 31* 28* 25*  CREATININE 1.71* 1.32* 1.21  CALCIUM 8.6* 8.5* 8.8*  GFRNONAA 39* 53* 59*      Latest Ref Rng & Units 03/05/2022    5:45 AM 03/04/2022    1:55 AM 03/03/2022    1:11 AM  CMP  Glucose 70 - 99 mg/dL 72  89  94   BUN 8 - 23 mg/dL _1 Creatinine 0.61 - 1.24 mg/dL 1.21  1.32  1.71   Sodium 135 - 145 mmol/L 143  138  140   Potassium 3.5 - 5.1 mmol/L 3.5  3.7  4.3   Chloride 98 - 111 mmol/L 104  105  107   CO2 22 - 32 mmol/L _2 Calcium 8.9 - 10.3 mg/dL 8.8  8.5  8.6   Total Protein 6.5 - 8.1 g/dL   5.2   Total Bilirubin 0.3 - 1.2  mg/dL   0.7   Alkaline Phos 38 - 126 U/L   57   AST 15 - 41 U/L   38   ALT 0 - 44 U/L   95       Latest Ref Rng & Units 03/05/2022    5:45 AM 03/04/2022    1:55 AM 03/03/2022    1:11 AM  CBC  WBC 4.0 - 10.5 K/uL 7.8  8.6  12.4   Hemoglobin 13.0 - 17.0 g/dL 12.4  12.6  12.8   Hematocrit 39.0 - 52.0 % 38.4  38.7  38.4   Platelets 150 - 400 K/uL 234  226  188   HEMOGLOBIN A1C Lab Results  Component Value Date   HGBA1C 5.4 08/17/2017   MPG 108.28 08/17/2017   Lab Results  Component Value Date   TSH 4.376 08/17/2017    External Labs:   Labs 04/15/2022:  A1c 5.1%.  TSH mildly elevated at 9.830.  Serum glucose 105 mg, BUN 21, creatinine 1.64, EGFR 41 mL, potassium 4.0.  Hb 14.9/HCT 45.2, platelets 223.  08/29/2021: Total cholesterol 143, triglycerides 117, HDL 52, LDL 70  Allergies  No Known Allergies   Medications    Current Outpatient Medications:  .  amiodarone (PACERONE) 200 MG tablet, Take 100 mg by mouth daily., Disp: , Rfl:  .  atorvastatin (LIPITOR) 20 MG tablet, Take 1 tablet (20 mg total) by mouth daily at 6 PM. (Patient taking differently: Take 20 mg by mouth daily.), Disp: 30 tablet, Rfl: 6 .  FLUoxetine (PROZAC) 20 MG capsule, Take 20 mg by mouth daily., Disp: , Rfl:  .  guaiFENesin (MUCINEX) 600 MG 12 hr tablet, Take 1 tablet (600 mg total) by mouth 2 (two) times daily as needed for cough., Disp: 30 tablet, Rfl: 0 .  [START ON 04/29/2022] nitrofurantoin, macrocrystal-monohydrate, (MACROBID) 100 MG capsule, Take 100 mg by mouth 2 (two) times daily., Disp: , Rfl:  .  Rivaroxaban (XARELTO) 15 MG TABS tablet, Take 1 tablet (15 mg total) by mouth daily with supper. (Patient taking differently: Take 15 mg by mouth daily.), Disp: 30 tablet, Rfl: 3   Radiology:   No results found.  Cardiac Studies:  Direct current cardioversion 11/12/2021 10:35 AM  Indication symptomatic A. Fibrillation.  Procedure: Using 60 mg of IV Propofol and 60 IV Lidocaine (for reducing  venous pain)  for achieving deep sedation, synchronized direct current cardioversion performed. Patient was delivered with 120 x1, 150x1 then 200 Joules of electricity X 1 with success to NSR. Patient tolerated the procedure well. No immediate complication noted.   Echocardiogram 08/13/2021: Mild calcification of the ascending aorta. Left Ventricle is normal in size, normal wall thickness, LVEF 50-55%.  LV systolic function is borderline reduced with borderline global hypokinesis of the LV.  RV is normal in size and systolic function is mildly reduced. Right ventricular systolic pressure is normal.   Echocardiogram 08/17/2017:  Left ventricle: Cavity size was normal. Wall thickness was normal. Systolic function was normal. The estimated ejection fraction was in the range of 60% to 65%. Wall motion was normal; there were no regional wall abnormalities. Doppler parameters are consistent with abnormal LV relaxation (grade 1 diastolic dysfunction). Right atrium: mildly dilate.   EKG   EKG 04/28/2022: Marked sinus bradycardia at a rate of 58 bpm, left axis deviation, left anterior fascicular block.  Poor R wave progression, cannot exclude anteroseptal infarct old.  Low-voltage complexes.  Pulmonary disease pattern.  Compared to 11/25/2021, heart rate has decreased from 71 bpm but otherwise no change.   08/30/2021: Atrial fibrillation with controlled ventricular response at a rate of 104 bpm.  Nonspecific T wave abnormality.   Assessment     ICD-10-CM   1. Paroxysmal atrial fibrillation (HCC)  I48.0 EKG 12-Lead    2. Essential hypertension  I10     3. Stage 3b chronic kidney disease (HCC)  N18.32       There are no discontinued medications.   No orders of the defined types were placed in this encounter.  This patients CHA2DS2-VASc Score 3 (HTN, Age) and yearly risk of stroke 3.2%.   Recommendations:   KEYGAN DUMOND is a 86 y.o. male with history of hypertension, hyperlipidemia, asthma, chronic  kidney disease stage III, COPD.  Patient diagnosed with atrial fibrillation in March 2019.  He was hospitalized in March 2023 with A-fib with RVR. Patient has haddirect current cardioversion on 11/12/2021 and since then has been maintaining sinus rhythm.    1. Paroxysmal atrial fibrillation (HCC) As per his family, he is feeling the best he has in quite a while without dyspnea, leg edema, PND or orthopnea.  EKG does not reveal any significant abnormality except bradycardia is new, and as he is asymptomatic and blood pressure is well controlled, would not recommend making any changes to his present medical regimen.  He is presently on 100 mg of amiodarone.  Although has sinus bradycardia remains asymptomatic.  2. Essential hypertension Patient is in excellent control.  3. Stage 3b chronic kidney disease ([place]) Stage IIIb chronic kidney disease is also remained stable, external labs reviewed.  I will see him back in 6 months for follow-up.    Adrian Prows, MD, [facility] 04/28/2022, 2:12 PM Office: [phone] Fax: [phone] Pager: [phone]

## 2022-10-27 ENCOUNTER — Ambulatory Visit: Payer: Medicare HMO | Admitting: Cardiology

## 2022-10-27 NOTE — Progress Notes (Deleted)
Primary Physician/Referring:  Elder Negus, NP  Patient ID: Richard Stanley, male    DOB: 07-Oct-1935, 87 y.o.   MRN: [id number]  No chief complaint on file.  HPI:    Richard Stanley  is a 87 y.o. male with history of hypertension, hyperlipidemia, asthma, chronic kidney disease stage III, COPD.  Patient diagnosed with atrial fibrillation in March 2019.  He was hospitalized in March 2023 with A-fib with RVR. Patient has haddirect current cardioversion on 11/12/2021 and since then has been maintaining sinus rhythm.    He has chronic dyspnea on exertion related to underlying COPD.  Continues to tolerate anticoagulation without bleeding diathesis.  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   HYPERLIPIDEMIA 06/18/2009   Nodule of right lung    Other diseases of lung, not elsewhere classified 06/18/2009   Rash 10/01/2010   Thyroid nodule 10/01/2010   Past Surgical History:  Procedure Laterality Date   CARDIAC CATHETERIZATION  1998   normal per pt   CARDIOVERSION N/A 11/12/2021   Procedure: CARDIOVERSION;  Surgeon: Yates Decamp, MD;  Location: [facility] ENDOSCOPY;  Service: Cardiovascular;  Laterality: N/A;     Social History   Tobacco Use   Smoking status: Former    Packs/day: 1.00    Years: 38.00    Additional pack years: 0.00    Total pack years: 38.00    Types: Cigarettes    Quit date: 06/09/1989    Years since quitting: 33.4   Smokeless tobacco: Never   Tobacco comments:    Also smoke pipe in 1969 to 1991, several times a day  Substance Use Topics   Alcohol use: No   Marital Status: Married   ROS   Review of Systems  Cardiovascular:  Positive for dyspnea on exertion. Negative for chest pain and leg swelling.   Objective  There were no vitals taken for this visit.  Vitals with BMI 02/10/2020 02/22/2018 02/22/2018  Height 5\' 8"  - -  Weight 208 lbs - -  BMI 31.63 - -  Systolic 148 158 409  Diastolic 70 100 95  Pulse 80 88 100   No data found.    Physical Exam Vitals reviewed.  Neck:     Vascular: No carotid bruit or JVD.  Cardiovascular:     Rate and Rhythm: Regular rhythm. Bradycardia present.     Heart sounds: S1 normal and S2 normal. Heart sounds are distant. No murmur heard. Pulmonary:     Effort: Pulmonary effort is normal. No respiratory distress.     Breath sounds: Wheezing (Bilaterally throughout ) present.  Abdominal:     General: Bowel sounds are normal.     Palpations: Abdomen is soft.  Musculoskeletal:     Right lower leg: No edema.     Left lower leg: No edema.    Laboratory examination:   Recent Labs    03/03/22 0111 03/04/22 0155 03/05/22 0545  NA 140 138 143  K 4.3 3.7 3.5  CL 107 105 104  CO2 27 27 27   GLUCOSE 94 89 72  BUN 31* 28* 25*  CREATININE 1.71*  1.32* 1.21  CALCIUM 8.6* 8.5* 8.8*  GFRNONAA 39* 53* 59*       Latest Ref Rng & Units 03/05/2022    5:45 AM 03/04/2022    1:55 AM 03/03/2022    1:11 AM  CMP  Glucose 70 - 99 mg/dL 72  89  94   BUN 8 - 23 mg/dL 25  28  31    Creatinine 0.61 - 1.24 mg/dL 2.95  6.21  3.08   Sodium 135 - 145 mmol/L 143  138  140   Potassium 3.5 - 5.1 mmol/L 3.5  3.7  4.3   Chloride 98 - 111 mmol/L 104  105  107   CO2 22 - 32 mmol/L 27  27  27    Calcium 8.9 - 10.3 mg/dL 8.8  8.5  8.6   Total Protein 6.5 - 8.1 g/dL   5.2   Total Bilirubin 0.3 - 1.2 mg/dL   0.7   Alkaline Phos 38 - 126 U/L   57   AST 15 - 41 U/L   38   ALT 0 - 44 U/L   95       Latest Ref Rng & Units 03/05/2022    5:45 AM 03/04/2022    1:55 AM 03/03/2022    1:11 AM  CBC  WBC 4.0 - 10.5 K/uL 7.8  8.6  12.4   Hemoglobin 13.0 - 17.0 g/dL  65.7  84.6  96.2   Hematocrit 39.0 - 52.0 % 38.4  38.7  38.4   Platelets 150 - 400 K/uL 234  226  188   HEMOGLOBIN A1C Lab Results  Component Value Date   HGBA1C 5.4 08/17/2017   MPG 108.28 08/17/2017   Lab Results  Component Value Date   TSH 4.376 08/17/2017    External Labs:   Labs 04/15/2022:  A1c 5.1%.  TSH mildly elevated at 9.830.  Serum glucose 105 mg, BUN 21, creatinine 1.64, EGFR 41 mL, potassium 4.0.  Hb 14.9/HCT 45.2, platelets 223.  08/29/2021: Total cholesterol 143, triglycerides 117, HDL 52, LDL 70  Allergies  No Known Allergies   Medications    Current Outpatient Medications:    amiodarone (PACERONE) 200 MG tablet, Take 100 mg by mouth daily., Disp: , Rfl:    atorvastatin (LIPITOR) 20 MG tablet, Take 1 tablet (20 mg total) by mouth daily at 6 PM. (Patient taking differently: Take 20 mg by mouth daily.), Disp: 30 tablet, Rfl: 6   FLUoxetine (PROZAC) 20 MG capsule, Take 20 mg by mouth daily., Disp: , Rfl:    guaiFENesin (MUCINEX) 600 MG 12 hr tablet, Take 1 tablet (600 mg total) by mouth 2 (two) times daily as needed for cough., Disp: 30 tablet, Rfl: 0   nitrofurantoin, macrocrystal-monohydrate, (MACROBID) 100 MG capsule, Take 100 mg by mouth 2 (two) times daily., Disp: , Rfl:    Rivaroxaban (XARELTO) 15 MG TABS tablet, Take 1 tablet (15 mg total) by mouth daily with supper. (Patient taking differently: Take 15 mg by mouth daily.), Disp: 30 tablet, Rfl: 3   Radiology:   No results found.  Cardiac Studies:  Direct current cardioversion 11/12/2021 10:35 AM  Indication symptomatic A. Fibrillation.  Procedure: Using 60 mg of IV Propofol and 60 IV Lidocaine (for reducing venous pain) for achieving deep sedation, synchronized direct current cardioversion performed. Patient was delivered with 120 x1, 150x1 then 200 Joules of electricity X 1 with success to NSR. Patient tolerated the procedure well. No immediate complication noted.   Echocardiogram 08/13/2021: Mild  calcification of the ascending aorta. Left Ventricle is normal in size, normal wall thickness, LVEF 50-55%.  LV systolic function is borderline reduced with borderline global hypokinesis of the LV.  RV is normal in size and systolic function is mildly reduced. Right ventricular systolic pressure is normal.   Echocardiogram 08/17/2017:  Left ventricle: Cavity size was normal. Wall thickness was normal. Systolic function was normal. The estimated ejection fraction was in the range of 60% to 65%. Wall motion was normal; there were no regional wall abnormalities. Doppler parameters are consistent with abnormal LV relaxation (grade 1 diastolic dysfunction). Right atrium: mildly dilate.   EKG   EKG 04/28/2022: Marked sinus bradycardia at a rate of 58 bpm, left axis deviation, left anterior fascicular block.  Poor R wave progression, cannot exclude anteroseptal infarct old.  Low-voltage complexes.  Pulmonary disease pattern.  Compared to 11/25/2021, heart rate has decreased from 71 bpm but otherwise no change.   08/30/2021: Atrial fibrillation with controlled ventricular response at a rate of 104 bpm.  Nonspecific T wave abnormality.   Assessment     ICD-10-CM   1. Paroxysmal atrial fibrillation (HCC)  I48.0     2. Essential hypertension  I10     3. Stage 3b chronic kidney disease (HCC)  N18.32       There are no discontinued medications.   No orders of the defined types were placed in this encounter.  This patients CHA2DS2-VASc Score 3 (HTN, Age) and yearly risk of stroke 3.2%.   Recommendations:   Richard Stanley is a 87 y.o. male with history of hypertension, hyperlipidemia, asthma, chronic kidney disease stage III, COPD.  Patient diagnosed with atrial fibrillation in March 2019.  He was hospitalized in March 2023 with A-fib with RVR. Patient has haddirect current cardioversion on 11/12/2021 and since then has been maintaining sinus rhythm.    1. Paroxysmal atrial fibrillation (HCC) As per  his family, he is feeling the best he has in quite a while without dyspnea, leg edema, PND or orthopnea.  EKG does not reveal any significant abnormality except bradycardia is new, and as he is asymptomatic and blood pressure is well controlled, would not recommend making any changes to his present medical regimen.  He is presently on 100 mg of amiodarone.  Although has sinus bradycardia remains asymptomatic.  2. Essential hypertension Patient is in excellent control.  3. Stage 3b chronic kidney disease (HCC) Stage IIIb chronic kidney disease is also remained stable, external labs reviewed.  I will see him back in 6 months for follow-up.    Yates Decamp, MD, [facility] 10/27/2022, 7:03 AM Office: [phone] Fax: [phone] Pager: [phone]

## 2022-12-21 ENCOUNTER — Emergency Department (HOSPITAL_COMMUNITY): Payer: Medicare HMO

## 2022-12-21 ENCOUNTER — Emergency Department (HOSPITAL_COMMUNITY)
Admission: EM | Admit: 2022-12-21 | Discharge: 2022-12-21 | Disposition: A | Discharge status: Home / Self Care | Payer: Medicare HMO | Attending: Emergency Medicine | Admitting: Emergency Medicine

## 2022-12-21 ENCOUNTER — Other Ambulatory Visit: Payer: Self-pay

## 2022-12-21 DIAGNOSIS — J439 Emphysema, unspecified: Secondary | ICD-10-CM | POA: Insufficient documentation

## 2022-12-21 DIAGNOSIS — R519 Headache, unspecified: Secondary | ICD-10-CM | POA: Diagnosis not present

## 2022-12-21 DIAGNOSIS — M542 Cervicalgia: Secondary | ICD-10-CM | POA: Insufficient documentation

## 2022-12-21 DIAGNOSIS — W07XXXA Fall from chair, initial encounter: Secondary | ICD-10-CM | POA: Insufficient documentation

## 2022-12-21 DIAGNOSIS — R55 Syncope and collapse: Secondary | ICD-10-CM | POA: Diagnosis not present

## 2022-12-21 DIAGNOSIS — R4189 Other symptoms and signs involving cognitive functions and awareness: Secondary | ICD-10-CM | POA: Insufficient documentation

## 2022-12-21 DIAGNOSIS — R42 Dizziness and giddiness: Secondary | ICD-10-CM | POA: Insufficient documentation

## 2022-12-21 DIAGNOSIS — R911 Solitary pulmonary nodule: Secondary | ICD-10-CM | POA: Diagnosis not present

## 2022-12-21 DIAGNOSIS — W19XXXA Unspecified fall, initial encounter: Secondary | ICD-10-CM

## 2022-12-21 LAB — COMPREHENSIVE METABOLIC PANEL
ALT: 27 U/L (ref 0–44)
AST: 24 U/L (ref 15–41)
Albumin: 3.4 g/dL — ABNORMAL LOW (ref 3.5–5.0)
Alkaline Phosphatase: 72 U/L (ref 38–126)
Anion gap: 14 (ref 5–15)
BUN: 31 mg/dL — ABNORMAL HIGH (ref 8–23)
CO2: 26 mmol/L (ref 22–32)
Calcium: 8.4 mg/dL — ABNORMAL LOW (ref 8.9–10.3)
Chloride: 101 mmol/L (ref 98–111)
Creatinine, Ser: 1.87 mg/dL — ABNORMAL HIGH (ref 0.61–1.24)
GFR, Estimated: 35 mL/min — ABNORMAL LOW (ref >60)
Glucose, Bld: 98 mg/dL (ref 70–99)
Potassium: 4 mmol/L (ref 3.5–5.1)
Sodium: 141 mmol/L (ref 135–145)
Total Bilirubin: 0.2 mg/dL — ABNORMAL LOW (ref 0.3–1.2)
Total Protein: 5.6 g/dL — ABNORMAL LOW (ref 6.5–8.1)

## 2022-12-21 LAB — I-STAT CHEM 8, ED
BUN: 32 mg/dL — ABNORMAL HIGH (ref 8–23)
Calcium, Ion: 1.06 mmol/L — ABNORMAL LOW (ref 1.15–1.40)
Chloride: 104 mmol/L (ref 98–111)
Creatinine, Ser: 2 mg/dL — ABNORMAL HIGH (ref 0.61–1.24)
Glucose, Bld: 95 mg/dL (ref 70–99)
HCT: 41 % (ref 39.0–52.0)
Hemoglobin: 13.9 g/dL (ref 13.0–17.0)
Potassium: 3.9 mmol/L (ref 3.5–5.1)
Sodium: 140 mmol/L (ref 135–145)
TCO2: 28 mmol/L (ref 22–32)

## 2022-12-21 LAB — CBC
HCT: 44 % (ref 39.0–52.0)
Hemoglobin: 13.9 g/dL (ref 13.0–17.0)
MCH: 30 pg (ref 26.0–34.0)
MCHC: 31.6 g/dL (ref 30.0–36.0)
MCV: 94.8 fL (ref 80.0–100.0)
Platelets: 175 10*3/uL (ref 150–400)
RBC: 4.64 MIL/uL (ref 4.22–5.81)
RDW: 12.4 % (ref 11.5–15.5)
WBC: 7.6 10*3/uL (ref 4.0–10.5)
nRBC: 0 % (ref 0.0–0.2)

## 2022-12-21 LAB — PROTIME-INR
INR: 1.1 (ref 0.8–1.2)
Prothrombin Time: 14.2 seconds (ref 11.4–15.2)

## 2022-12-21 LAB — I-STAT CG4 LACTIC ACID, ED: Lactic Acid, Venous: 1.1 mmol/L (ref 0.5–1.9)

## 2022-12-21 NOTE — Progress Notes (Signed)
   12/21/22 1930  Spiritual Encounters  Type of Visit Initial  Care provided to: Pt and family  Referral source Trauma page  Reason for visit Trauma  OnCall Visit Yes  Interventions  Spiritual Care Interventions Made Established relationship of care and support;Compassionate presence;Reflective listening;Normalization of emotions  Intervention Outcomes  Outcomes Connection to spiritual care;Awareness of support;Reduced anxiety;Reduced fear   Chaplain responded to level 2 Trauma. Chaplain met patient's wife Fannie Knee in the hallway and provided her support as she waited to see the patient. The patient and spouse declined further spiritual support at this time.   Arlyce Dice, Chaplain Resident [phone]

## 2022-12-21 NOTE — ED Notes (Signed)
Pt back in room from CT at this time.

## 2022-12-21 NOTE — ED Notes (Deleted)
Patient transported to Ultrasound 

## 2022-12-21 NOTE — ED Provider Notes (Signed)
[place] [facility] AT [facility] Provider Note   CSN: [id number] Arrival date & time: 12/21/22  4098     History Chief Complaint  Patient presents with   Fall    HPI Richard Stanley is a 87 y.o. male presenting for fall at home.  He states he was eating dinner when he lost his balance in the chair falling over to the side.  His neck hurts his right scalp hurts.  He denies fevers chills nausea vomiting syncope or shortness of breath.  Otherwise ambulatory tolerating p.o. intake prior to this event.[name] compliance with anticoagulation Patient's recorded medical, surgical, social, medication list and allergies were reviewed in the Snapshot window as part of the initial history.   Review of Systems   Review of Systems  Constitutional:  Negative for chills and fever.  HENT:  Negative for ear pain and sore throat.   Eyes:  Negative for pain and visual disturbance.  Respiratory:  Negative for cough and shortness of breath.   Cardiovascular:  Negative for chest pain and palpitations.  Gastrointestinal:  Negative for abdominal pain and vomiting.  Genitourinary:  Negative for dysuria and hematuria.  Musculoskeletal:  Negative for arthralgias and back pain.  Skin:  Negative for color change and rash.  Neurological:  Positive for headaches. Negative for seizures and syncope.  All other systems reviewed and are negative.   Physical Exam Updated Vital Signs BP (!) 164/74   Pulse (!) 57   Temp 98.3 F (36.8 C)   Resp 20   Ht 5\' 8"  (1.727 m)   Wt 70.6 kg   SpO2 97%   BMI 23.67 kg/m  Physical Exam Vitals and nursing note reviewed.  Constitutional:      General: He is not in acute distress.    Appearance: He is well-developed.  HENT:     Head: Normocephalic and atraumatic.  Eyes:     Conjunctiva/sclera: Conjunctivae normal.  Cardiovascular:     Rate and Rhythm: Normal rate and regular rhythm.     Heart sounds: No murmur heard. Pulmonary:      Effort: Pulmonary effort is normal. No respiratory distress.     Breath sounds: Normal breath sounds.  Abdominal:     Palpations: Abdomen is soft.     Tenderness: There is no abdominal tenderness.  Musculoskeletal:        General: No swelling.     Cervical back: Neck supple. Tenderness present.  Skin:    General: Skin is warm and dry.     Capillary Refill: Capillary refill takes less than 2 seconds.  Neurological:     Mental Status: He is alert.  Psychiatric:        Mood and Affect: Mood normal.      ED Course/ Medical Decision Making/ A&P Clinical Course as of 12/21/22 2112  Sun Dec 21, 2022  2026  6 mm pulmonary nodule in the left upper lobe. Non-contrast chest CT at 6-12 months is recommended. If the nodule is stable at time of repeat CT, then future CT at 18-24 months (from today's scan) is considered optional for low-risk patients, but is recommended for high-risk patients. This recommendation follows the consensus statement: Guidelines for Management of Incidental Pulmonary Nodules Detected on CT Images: From the Fleischner Society 2017; Radiology   [CC]    Clinical Course User Index [CC] [name], MD    Procedures .Critical Care  Performed by: [name], MD Authorized by: [name], MD  Critical care provider statement:    Critical care time (minutes):  30   Critical care was necessary to treat or prevent imminent or life-threatening deterioration of the following conditions:  Trauma   Critical care was time spent personally by me on the following activities:  Development of treatment plan with patient or surrogate, discussions with consultants, evaluation of patient's response to treatment, examination of patient, ordering and review of laboratory studies, ordering and review of radiographic studies, ordering and performing treatments and interventions, pulse oximetry, re-evaluation of patient's condition and review of old charts     Medications Ordered in ED Medications - No data to display Medical Decision Making:    [name] is a 87 y.o. male who presented to the ED today with a high mechanisma trauma, detailed above.    By institutional and departmental policy this was activated as a level 2 trauma. Patient's presentation is complicated by their history of multiple comorbid medical conditions.  Patient placed on continuous vitals and telemetry monitoring while in ED which was reviewed periodically.   Given this mechanism of trauma, a full physical exam was performed.  Reviewed and confirmed nursing documentation for past medical history, family history, social history.    Initial Assessment/Plan:   I was called emergently to patient's bedside for a primary survey.  Primary survey: Airway intact.  BL breath sounds present.   Circulation established with WNL BP, 2 large bore IVs, and radial/femoral pulses.   Disability evaluation negative. No obvious disability requiring intervention.   Patient fully exposed and all injuries were noted, any penetrating injuries were labeled with radiopaque markers.  No emergent interventions took place in the primary survey.    Patient stable for CXR that demonstrated no traumatic hemopneumothorax and PXR that demonstrated no unstable pelvic fractures.  EFAST deferred.   Secondary survey: Once patient was stabilized, I personally performed a secondary survey to evaluate for any other injuries.  Results of this evaluation documented in the physical exam section. This is a patient presenting with a high mechanism trauma.  As such, I have considered intracranial injuries including intracranial hemorrhage, intrathoracic injuries including blunt myocardial or blunt lung injury, blunt abdominal injuries including aortic dissection, bladder injury, spleen injury, liver injury and I have considered orthopedic injuries including extremity or spinal injury.   This was all  evaluated by the below imaging as well as concurrently ordered laboratory evaluation which was reviewed.  Radiology: All radiology results were reviewed independently and agree with reads per radiology provider. CT HEAD WO CONTRAST  Result Date: 12/21/2022 CLINICAL DATA:  Head trauma, fall EXAM: CT HEAD WITHOUT CONTRAST TECHNIQUE: Contiguous axial images were obtained from the base of the skull through the vertex without intravenous contrast. RADIATION DOSE REDUCTION: This exam was performed according to the departmental dose-optimization program which includes automated exposure control, adjustment of the mA and/or kV according to patient size and/or use of iterative reconstruction technique. COMPARISON:  08/17/2017 FINDINGS: Brain: Normal anatomic configuration. Parenchymal volume loss is commensurate with the patient's age. Stable mild periventricular white matter changes are present likely reflecting the sequela of small vessel ischemia. No abnormal intra or extra-axial mass lesion or fluid collection. No abnormal mass effect or midline shift. No evidence of acute intracranial hemorrhage or infarct. Ventricular size is normal. Cerebellum unremarkable. Vascular: No asymmetric hyperdense vasculature at the skull base. Skull: Intact Sinuses/Orbits: Paranasal sinuses are clear. Orbits are unremarkable. Other: Mastoid air cells and middle ear cavities are clear. IMPRESSION: 1. No acute  intracranial hemorrhage or infarct. No calvarial fracture. 2. Stable senescent change. Electronically Signed   By: Helyn Numbers M.D.   On: 12/21/2022 20:09   CT CERVICAL SPINE WO CONTRAST  Result Date: 12/21/2022 CLINICAL DATA:  Blunt polytrauma from fall EXAM: CT CERVICAL SPINE WITHOUT CONTRAST TECHNIQUE: Multidetector CT imaging of the cervical spine was performed without intravenous contrast. Multiplanar CT image reconstructions were also generated. RADIATION DOSE REDUCTION: This exam was performed according to the  departmental dose-optimization program which includes automated exposure control, adjustment of the mA and/or kV according to patient size and/or use of iterative reconstruction technique. COMPARISON:  None Available. FINDINGS: Alignment: No evidence of traumatic malalignment. Skull base and vertebrae: No acute fracture. No primary bone lesion or focal pathologic process. Soft tissues and spinal canal: No prevertebral fluid or swelling. No visible canal hematoma. Disc levels: Multilevel spondylosis, disc space height loss, and degenerative endplate changes greatest at C4-C5 and C5-C6 where it is advanced. No severe spinal canal narrowing. Upper chest: 6 mm pulmonary nodule in the left upper lobe. Other: None. IMPRESSION: 1. No cervical spine fracture. 2. 6 mm pulmonary nodule in the left upper lobe. Non-contrast chest CT at 6-12 months is recommended. If the nodule is stable at time of repeat CT, then future CT at 18-24 months (from today's scan) is considered optional for low-risk patients, but is recommended for high-risk patients. This recommendation follows the consensus statement: Guidelines for Management of Incidental Pulmonary Nodules Detected on CT Images: From the Fleischner Society 2017; Radiology 2017; 284:228-243. Electronically Signed   By: Minerva Fester M.D.   On: 12/21/2022 20:04   DG Pelvis Portable  Result Date: 12/21/2022 CLINICAL DATA:  Fall EXAM: PORTABLE PELVIS 1-2 VIEWS COMPARISON:  None Available. FINDINGS: There is no evidence of pelvic fracture or diastasis. No pelvic bone lesions are seen. Degenerative changes pubic symphysis, both hips, SI joints and lower lumbar spine. IMPRESSION: No acute fracture or dislocation. Electronically Signed   By: Minerva Fester M.D.   On: 12/21/2022 19:46   DG Chest Port 1 View  Result Date: 12/21/2022 CLINICAL DATA:  Fall today EXAM: PORTABLE CHEST 1 VIEW COMPARISON:  03/06/2022 FINDINGS: The heart size and mediastinal contours are within normal  limits. Hyperinflation. Both lungs are clear. The visualized skeletal structures are unremarkable. IMPRESSION: No active disease.  Emphysema. Electronically Signed   By: Minerva Fester M.D.   On: 12/21/2022 19:45    Final Reassessment and Plan:   Patient reassessed frequently while in the emergency department.  Neurologic status maintained within normal limits.  Head CT did not reveal any focal bleeding.  Pain was under control and on reassessment he is no longer dizzy and feels okay with no acute symptoms.  Wife is at bedside states that he has had extensive dizziness over the last 10 years.  Patient has cognitive impairment and is difficult to get a clear history from.  He describes both presyncope and vertiginous dizziness but does state that the symptoms are gone at this time. Given resolution of believe patient stable for outpatient care and management with safety precautions such as a walker i and avoiding tripping hazards at home. Will refer back to cardiology primarily due to his cardiac history and more consistency with presyncope rather than vertigo.  However we will also give them contact information for neurology.  Patient likely would need both a cognitive impairment workup and dizziness evaluation if he was to pursue neurologic eval.  No acute distress at this time stable  for outpatient care and management. Disposition:  I have considered need for hospitalization, however, considering all of the above, I believe this patient is stable for discharge at this time.  Patient/family educated about specific return precautions for given chief complaint and symptoms.  Patient/family educated about follow-up with PCP.     Patient explicitly told about pulmonary nodule incidental findings and importance of repeat CT/PCP follow up.   Patient/family expressed understanding of return precautions and need for follow-up. Patient spoken to regarding all imaging and laboratory results and appropriate  follow up for these results. All education provided in verbal form with additional information in written form. Time was allowed for answering of patient questions. Patient discharged.    Emergency Department Medication Summary:   Medications - No data to display         Clinical Impression:  1. Fall, initial encounter   2. Pulmonary nodule      Discharge   Final Clinical Impression(s) / ED Diagnoses Final diagnoses:  Fall, initial encounter  Pulmonary nodule    Rx / DC Orders ED Discharge Orders     None         [name], MD 12/21/22 2112

## 2022-12-21 NOTE — ED Triage Notes (Signed)
Chief Complaint  Patient presents with   Fall   Pt presents to ED 28 via EMS from home as Level 2 Trauma due to fall on thinners.  Per report, pt endorsed dizziness during day and then had witnessed ground-level fall PTA.  Pt does not remember fall; hematoma noted to right head, no bleeding at this time.  Pt alert and oriented; able to follow commands.  Pt takes Xarelto.  Pt uses cane to ambulate at baseline.

## 2022-12-21 NOTE — ED Notes (Signed)
C-collar removed by provider

## 2022-12-21 NOTE — ED Notes (Signed)
Patient transported to CT 

## 2022-12-21 NOTE — Discharge Instructions (Addendum)
He was seen today for a fall at home.  Your workup did not reveal any traumatic injuries.  We discussed your daily dizziness has been present for 10 years.  I recommend you follow-up with your cardiologist as this sounds like presyncope rather than vertigo.  However if your cardiologist does not have any clear etiology then it may be reasonable to follow-up with a neurologist after your appointment with a cardiologist.  I have attached resources for both of the specialist please follow-up in the outpatient setting.  Thank for the opportunity to participate in your care, [name] MD      6 mm pulmonary nodule in the left upper lobe. Non-contrast chest  CT at 6-12 months is recommended. If the nodule is stable at time of  repeat CT, then future CT at 18-24 months (from today's scan) is  considered optional for low-risk patients, but is recommended for  high-risk patients. This recommendation follows the consensus  statement: Guidelines for Management of Incidental Pulmonary Nodules  Detected on CT Images: From the Fleischner Society 2017; Radiology

## 2022-12-21 NOTE — ED Notes (Signed)
Pt provided with AVS.  Education complete; all questions answered.  Pt leaving ED in stable condition at this time via wheelchair with all belongings. 

## 2023-01-10 ENCOUNTER — Emergency Department (HOSPITAL_COMMUNITY): Payer: Medicare HMO

## 2023-01-10 ENCOUNTER — Emergency Department (HOSPITAL_COMMUNITY)
Admission: EM | Admit: 2023-01-10 | Discharge: 2023-01-10 | Disposition: A | Discharge status: Home / Self Care | Payer: Medicare HMO | Attending: Emergency Medicine | Admitting: Emergency Medicine

## 2023-01-10 ENCOUNTER — Other Ambulatory Visit: Payer: Self-pay

## 2023-01-10 ENCOUNTER — Emergency Department (HOSPITAL_BASED_OUTPATIENT_CLINIC_OR_DEPARTMENT_OTHER): Payer: Medicare HMO

## 2023-01-10 ENCOUNTER — Encounter (HOSPITAL_COMMUNITY): Payer: Self-pay | Admitting: *Deleted

## 2023-01-10 DIAGNOSIS — M79601 Pain in right arm: Secondary | ICD-10-CM

## 2023-01-10 DIAGNOSIS — L03113 Cellulitis of right upper limb: Secondary | ICD-10-CM | POA: Diagnosis not present

## 2023-01-10 DIAGNOSIS — R42 Dizziness and giddiness: Secondary | ICD-10-CM | POA: Diagnosis not present

## 2023-01-10 DIAGNOSIS — N189 Chronic kidney disease, unspecified: Secondary | ICD-10-CM | POA: Diagnosis not present

## 2023-01-10 DIAGNOSIS — L03119 Cellulitis of unspecified part of limb: Secondary | ICD-10-CM

## 2023-01-10 DIAGNOSIS — Z79899 Other long term (current) drug therapy: Secondary | ICD-10-CM | POA: Insufficient documentation

## 2023-01-10 DIAGNOSIS — J449 Chronic obstructive pulmonary disease, unspecified: Secondary | ICD-10-CM | POA: Diagnosis not present

## 2023-01-10 DIAGNOSIS — R2231 Localized swelling, mass and lump, right upper limb: Secondary | ICD-10-CM | POA: Diagnosis present

## 2023-01-10 DIAGNOSIS — Z87891 Personal history of nicotine dependence: Secondary | ICD-10-CM | POA: Diagnosis not present

## 2023-01-10 DIAGNOSIS — I129 Hypertensive chronic kidney disease with stage 1 through stage 4 chronic kidney disease, or unspecified chronic kidney disease: Secondary | ICD-10-CM | POA: Diagnosis not present

## 2023-01-10 DIAGNOSIS — Z7901 Long term (current) use of anticoagulants: Secondary | ICD-10-CM | POA: Diagnosis not present

## 2023-01-10 LAB — CBC WITH DIFFERENTIAL/PLATELET
Abs Immature Granulocytes: 0.04 10*3/uL (ref 0.00–0.07)
Basophils Absolute: 0 10*3/uL (ref 0.0–0.1)
Basophils Relative: 0 %
Eosinophils Absolute: 0 10*3/uL (ref 0.0–0.5)
Eosinophils Relative: 0 %
HCT: 43.1 % (ref 39.0–52.0)
Hemoglobin: 14.2 g/dL (ref 13.0–17.0)
Immature Granulocytes: 1 %
Lymphocytes Relative: 7 %
Lymphs Abs: 0.6 10*3/uL — ABNORMAL LOW (ref 0.7–4.0)
MCH: 31.1 pg (ref 26.0–34.0)
MCHC: 32.9 g/dL (ref 30.0–36.0)
MCV: 94.5 fL (ref 80.0–100.0)
Monocytes Absolute: 0.6 10*3/uL (ref 0.1–1.0)
Monocytes Relative: 7 %
Neutro Abs: 7.1 10*3/uL (ref 1.7–7.7)
Neutrophils Relative %: 85 %
Platelets: 182 10*3/uL (ref 150–400)
RBC: 4.56 MIL/uL (ref 4.22–5.81)
RDW: 12.5 % (ref 11.5–15.5)
WBC: 8.4 10*3/uL (ref 4.0–10.5)
nRBC: 0 % (ref 0.0–0.2)

## 2023-01-10 LAB — COMPREHENSIVE METABOLIC PANEL
ALT: 26 U/L (ref 0–44)
AST: 23 U/L (ref 15–41)
Albumin: 3.4 g/dL — ABNORMAL LOW (ref 3.5–5.0)
Alkaline Phosphatase: 76 U/L (ref 38–126)
Anion gap: 11 (ref 5–15)
BUN: 26 mg/dL — ABNORMAL HIGH (ref 8–23)
CO2: 26 mmol/L (ref 22–32)
Calcium: 8.8 mg/dL — ABNORMAL LOW (ref 8.9–10.3)
Chloride: 101 mmol/L (ref 98–111)
Creatinine, Ser: 1.81 mg/dL — ABNORMAL HIGH (ref 0.61–1.24)
GFR, Estimated: 36 mL/min — ABNORMAL LOW (ref >60)
Glucose, Bld: 107 mg/dL — ABNORMAL HIGH (ref 70–99)
Potassium: 4.2 mmol/L (ref 3.5–5.1)
Sodium: 138 mmol/L (ref 135–145)
Total Bilirubin: 0.8 mg/dL (ref 0.3–1.2)
Total Protein: 5.8 g/dL — ABNORMAL LOW (ref 6.5–8.1)

## 2023-01-10 LAB — URINALYSIS, ROUTINE W REFLEX MICROSCOPIC
Bilirubin Urine: NEGATIVE
Glucose, UA: NEGATIVE mg/dL
Ketones, ur: NEGATIVE mg/dL
Leukocytes,Ua: NEGATIVE
Nitrite: NEGATIVE
Protein, ur: NEGATIVE mg/dL
RBC / HPF: >50 RBC/hpf (ref 0–5)
Specific Gravity, Urine: 1.011 (ref 1.005–1.030)
pH: 5 (ref 5.0–8.0)

## 2023-01-10 MED ORDER — DOXYCYCLINE HYCLATE 100 MG PO CAPS: 100.0000 mg | ORAL_CAPSULE | Freq: Two times a day (BID) | ORAL | 0 refills | Status: DC

## 2023-01-10 MED ORDER — SODIUM CHLORIDE 0.9 % IV BOLUS
1000.0000 mL | Freq: Once | INTRAVENOUS | Status: AC
  Administered 2023-01-10: 1000 mL via INTRAVENOUS

## 2023-01-10 MED ORDER — ACETAMINOPHEN 500 MG PO TABS
1000.0000 mg | ORAL_TABLET | Freq: Once | ORAL | Status: DC
  Filled 2023-01-10: qty 2

## 2023-01-10 NOTE — ED Provider Notes (Signed)
Pt here with right arm redness and swelling and c/o of dizziness that has been present for awhile but worse after hitting his head in the last 2 weeks.  Pt Head CT is neg for bleed then and now.  Labs appear to be at baseline. US of the upper ext pending.  Concern for DVT vs cellulitis.  Ultrasound is negative for DVT.  Labs are reassuring.  UA without evidence of infection at this time.  Patient's wife did report that she had been giving him aspirin to help with the pain he was having however he is on Xarelto as well.  Discussed with her avoiding aspirin and ibuprofen products and the future and using Tylenol as needed for his pain.  Patient was able to stand and walk without any issues.  He and his wife are comfortable going home and following up with PCP.   Gwyneth Sprout, MD 01/10/23 [id number]

## 2023-01-10 NOTE — Progress Notes (Signed)
VASCULAR LAB    Right upper extremity venous duplex has been performed.  See CV proc for preliminary results.  Messaged negative results to Dr. Anitra Lauth via secure chat  Sherren Kerns, RVT 01/10/2023, 3:40 PM

## 2023-01-10 NOTE — ED Triage Notes (Signed)
Wife states patient fell 07/14 and hit his head was seen by MD and had several xrays and CT of head all were negative. Wife states his right forearm was swollen today and c/o numbness to his finger tips.

## 2023-01-10 NOTE — Discharge Instructions (Addendum)
The repeat CAT scan of your brain looked good today without any signs of bleeding.  You may have a concussion causing the dizziness after hitting your head 2 weeks ago.  Also blood work today looks good with normal blood counts.  Kidneys look about the same as they have.  Avoid aspirin and ibuprofen in the future but it is okay for you to use Tylenol as needed for pain also there are lidocaine patches over-the-counter one of the names as Salonpas but there are others that you can use for pain in your neck and back.  You were given a prescription for an antibiotic for your arm however make sure you are elevating it putting a cool compress on it and keep an eye on it.  Follow-up with your doctor next week so they can recheck it to make sure it is getting better.

## 2023-01-10 NOTE — ED Notes (Signed)
Pt ambulated and was monitored by ED Tech. Pt had steady gait with no complaints.

## 2023-01-10 NOTE — ED Notes (Signed)
Got patient into a gown on the monitor did EKG shown to er provider

## 2023-01-10 NOTE — ED Provider Notes (Signed)
[place] EMERGENCY DEPARTMENT AT [facility] Provider Note  CSN: [id number] Arrival date & time: 01/10/23 1054  Chief Complaint(s) Fall and Dizziness  HPI Richard Stanley is a 87 y.o. male history of atrial fibrillation, CKD, COPD, on Xarelto presenting to the emergency department with right arm swelling.  Patient noted this morning having right arm swelling to the volar aspect of his right forearm.  No injury, bite.  Area is tender.  No fevers or chills.  Also having lightheadedness/dizziness.  Reports that this is a chronic issue, but did get worse after he was here July 14 for head injury.  Imaging was negative at that time.  He cannot really describe the dizziness sensation at all but reports it is worse when standing up.  No numbness or tingling, focal weakness.  Reports mild generalized weakness.  No fevers or chills, urinary symptoms, cough, abdominal pain, chest pain, difficulty breathing.  Wife initially brought the patient to an urgent care but they felt that he needed to come to the emergency department for further evaluation.   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   HYPERLIPIDEMIA 06/18/2009   Nodule of right lung    Other diseases of lung, not elsewhere classified 06/18/2009   Rash 10/01/2010   Thyroid nodule 10/01/2010   Patient Active Problem List   Diagnosis Date Noted   Acute respiratory failure with hypoxia (HCC) 03/06/2022   Severe sepsis (HCC) 03/06/2022   CAP (community acquired pneumonia) 03/02/2022   Atrial fibrillation, chronic (HCC) 03/02/2022    Aggression 12/04/2021   COPD (chronic obstructive pulmonary disease) (HCC) 08/17/2017   Fall 08/17/2017   Closed right ankle fracture 08/17/2017   Leukocytosis 08/17/2017   HLD (hyperlipidemia)    Closed fracture of right distal fibula    Essential hypertension    Preventative health care 08/27/2011   CKD (chronic kidney disease), stage III (HCC) 08/27/2011   Atrial fibrillation with RVR (HCC) 05/15/2011   Thyroid nodule 10/01/2010   Rash 10/01/2010   Asthma 09/26/2010   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 Authorizing Provider  amiodarone (PACERONE) 200 MG tablet Take 100 mg by mouth daily.    [provider]  atorvastatin (LIPITOR) 20 MG tablet Take 1 tablet (20 mg total) by mouth daily at 6 PM. Patient taking differently: Take 20 mg by mouth daily. 02/26/22   Yates Decamp, MD  FLUoxetine (PROZAC) 20 MG capsule Take 20 mg by mouth daily. 12/12/21   [provider]  guaiFENesin (MUCINEX) 600 MG 12 hr tablet Take 1 tablet (600 mg total) by mouth 2 (two) times daily as needed for cough. 03/06/22   Calvert Cantor, MD  nitrofurantoin, macrocrystal-monohydrate, (MACROBID) 100 MG capsule Take 100 mg by mouth 2 (two) times daily. 04/29/22   [provider]  Rivaroxaban (XARELTO) 15 MG TABS tablet Take 1 tablet (15 mg total) by mouth daily with supper. Patient taking differently: Take 15 mg by mouth daily. 11/22/21   Rayford Halsted, PA-C                                                                                                                                    Past Surgical History Past Surgical History:  Procedure Laterality Date   CARDIAC  CATHETERIZATION  1998   normal per pt   CARDIOVERSION N/A 11/12/2021   Procedure: CARDIOVERSION;  Surgeon: Yates Decamp, MD;  Location: [facility] ENDOSCOPY;  Service: Cardiovascular;  Laterality: N/A;   Family History Family History  Problem Relation Age of Onset   Colon cancer Father    Cancer Father        colon   Other Sister        died after knee surgery    Social History Social History   Tobacco Use   Smoking status: Former    Current packs/day: 0.00    Average packs/day: 1 pack/day for 38.0 years (38.0 ttl pk-yrs)    Types: Cigarettes    Start date: 06/10/1951    Quit date: 06/09/1989    Years since quitting: 33.6   Smokeless tobacco: Never   Tobacco comments:    Also smoke pipe in 1969 to 1991, several times a day  Vaping Use   Vaping status: Never Used  Substance Use Topics   Alcohol use: Not Currently   Drug use: No   Allergies Patient has no known allergies.  Review of Systems Review of Systems  All other systems reviewed and are negative.   Physical Exam Vital Signs  I have reviewed the triage vital signs BP (!) 150/74   Pulse (!) 51   Temp (!) 97.5 F (36.4 C) (Oral)   Resp 20   Ht 5\' 7"  (1.702 m)   Wt 72.6 kg   SpO2 100%   BMI 25.06 kg/m  Physical Exam Vitals and nursing note reviewed.  Constitutional:      General: He is not in acute distress.    Appearance: Normal appearance.  HENT:     Mouth/Throat:     Mouth: Mucous membranes are dry.  Eyes:     Conjunctiva/sclera: Conjunctivae normal.  Cardiovascular:     Rate and Rhythm: Normal rate and regular rhythm.  Pulmonary:     Effort: Pulmonary effort is normal. No respiratory distress.     Breath sounds: Normal breath sounds.  Abdominal:     General: Abdomen is flat.     Palpations: Abdomen is soft.     Tenderness: There is no abdominal tenderness.  Musculoskeletal:     Right lower leg: No edema.     Left lower leg: No edema.  Skin:    General: Skin is warm and dry.     Capillary Refill:  Capillary refill  takes less than 2 seconds.     Comments: Focal area of warmth and erythema on the volar aspect of the distal right forearm.  Moves all digits of the right hand without difficulty.  No hand swelling.  No wounds.  Neurological:     Mental Status: He is alert and oriented to person, place, and time. Mental status is at baseline.     Comments: Cranial nerves II through XII intact, strength 5 out of 5 in the bilateral upper and lower extremities, no sensory deficit to light touch, no dysmetria on finger-nose-finger testing  Psychiatric:        Mood and Affect: Mood normal.        Behavior: Behavior normal.     ED Results and Treatments Labs (all labs ordered are listed, but only abnormal results are displayed) Labs Reviewed  COMPREHENSIVE METABOLIC PANEL - Abnormal; Notable for the following components:      Result Value   Glucose, Bld 107 (*)    BUN 26 (*)    Creatinine, Ser 1.81 (*)    Calcium 8.8 (*)    Total Protein 5.8 (*)    Albumin 3.4 (*)    GFR, Estimated 36 (*)    All other components within normal limits  CBC WITH DIFFERENTIAL/PLATELET - Abnormal; Notable for the following components:   Lymphs Abs 0.6 (*)    All other components within normal limits  URINALYSIS, ROUTINE W REFLEX MICROSCOPIC                                                                                                                          Radiology CT Head Wo Contrast  Result Date: 01/10/2023 CLINICAL DATA:  Altered mental status EXAM: CT HEAD WITHOUT CONTRAST TECHNIQUE: Contiguous axial images were obtained from the base of the skull through the vertex without intravenous contrast. RADIATION DOSE REDUCTION: This exam was performed according to the departmental dose-optimization program which includes automated exposure control, adjustment of the mA and/or kV according to patient size and/or use of iterative reconstruction technique. COMPARISON:  12/21/2022 FINDINGS: Brain: No evidence of  acute infarction, hemorrhage, hydrocephalus, extra-axial collection or mass lesion/mass effect. Periventricular and deep white matter hypodensity. Vascular: No hyperdense vessel or unexpected calcification. Skull: Normal. Negative for fracture or focal lesion. Sinuses/Orbits: No acute finding. Other: None. IMPRESSION: No acute intracranial pathology. Small-vessel white matter disease. Electronically Signed   By: Jearld Lesch M.D.   On: 01/10/2023 13:20   DG Chest Port 1 View  Result Date: 01/10/2023 CLINICAL DATA:  Cough EXAM: PORTABLE CHEST 1 VIEW COMPARISON:  Previous studies including the examination of 12/21/2022 FINDINGS: Cardiac size is within normal limits. Thoracic aorta is tortuous. Low position of the diaphragms suggests COPD. There are no signs of pulmonary edema or focal pulmonary consolidation. There is no pleural effusion or pneumothorax. IMPRESSION: COPD. There are no signs of pulmonary edema or focal pulmonary consolidation. Electronically Signed   By: Harlan Stains.D.  On: 01/10/2023 13:06    Pertinent labs & imaging results that were available during my care of the patient were reviewed by me and considered in my medical decision making (see MDM for details).  Medications Ordered in ED Medications  sodium chloride 0.9 % bolus 1,000 mL (1,000 mLs Intravenous New Bag/Given 01/10/23 1345)                                                                                                                                     Procedures Procedures  (including critical care time)  Medical Decision Making / ED Course   MDM:  87 year old male presenting to the emergency department with arm swelling, weakness.  Patient overall well-appearing, physical examination reassuring including neurologic exam, no dysmetria.  His dizziness/lightheadedness sounds like a chronic issue, has been present for months, even before his recent fall but worsened afterwards.  Could be worsened in the  setting of a concussion.  It is difficult to assess whether the patient feels vertiginous symptoms or lightheadedness as he is a poor historian but his neurologic exam is reassuring including cerebellar exam.  CT head obtained and reassuring.  Patient does appear mildly dehydrated so we will give IV fluids.  Creatinine is roughly at baseline.  Patient also with area of right arm swelling.  Appears focal, suspect possible cellulitis.  DVT ultrasound obtained and negative.  Patient also on Xarelto.  Doubt other thoracic process, chest x-ray clear.  Discussed with oncoming physician Dr. Anitra Lauth.  Patient pending urinalysis, ambulation trial.  If patient passes these then should be safe for discharge with outpatient antibiotic treatment of cellulitis.         Additional history obtained: -Additional history obtained from spouse -External records from outside source obtained and reviewed including: Chart review including previous notes, labs, imaging, consultation notes including previous ER visit 7/14   Lab Tests: -I ordered, reviewed, and interpreted labs.   The pertinent results include:   Labs Reviewed  COMPREHENSIVE METABOLIC PANEL - Abnormal; Notable for the following components:      Result Value   Glucose, Bld 107 (*)    BUN 26 (*)    Creatinine, Ser 1.81 (*)    Calcium 8.8 (*)    Total Protein 5.8 (*)    Albumin 3.4 (*)    GFR, Estimated 36 (*)    All other components within normal limits  CBC WITH DIFFERENTIAL/PLATELET - Abnormal; Notable for the following components:   Lymphs Abs 0.6 (*)    All other components within normal limits  URINALYSIS, ROUTINE W REFLEX MICROSCOPIC    Notable for chronic CKD  EKG   EKG Interpretation Date/Time:  Saturday January 10 2023 11:51:01 EDT Ventricular Rate:  58 PR Interval:  164 QRS Duration:  95 QT Interval:  451 QTC Calculation: 443 R Axis:   107  Text Interpretation: Sinus or ectopic atrial rhythm Right axis deviation  Confirmed by  Alvino Blood ([id number]) on 01/10/2023 2:36:56 PM         Imaging Studies ordered: I ordered imaging studies including CT head, CXR On my interpretation imaging demonstrates no acute process I independently visualized and interpreted imaging. I agree with the radiologist interpretation   Medicines ordered and prescription drug management: Meds ordered this encounter  Medications   sodium chloride 0.9 % bolus 1,000 mL    -I have reviewed the patients home medicines and have made adjustments as needed    Cardiac Monitoring: The patient was maintained on a cardiac monitor.  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    Final Clinical Impression(s) / ED Diagnoses Final diagnoses:  Dizziness, nonspecific  Cellulitis of forearm     This chart was dictated using voice  recognition software.  Despite best efforts to proofread,  errors can occur which can change the documentation meaning.    Lonell Grandchild, MD 01/10/23 [phone]

## 2023-02-11 NOTE — Telephone Encounter (Signed)
This encounter was created in error - please disregard.

## 2023-03-20 ENCOUNTER — Emergency Department (HOSPITAL_COMMUNITY)
Admission: EM | Admit: 2023-03-20 | Discharge: 2023-03-20 | Disposition: A | Discharge status: Home / Self Care | Payer: Medicare HMO | Attending: Emergency Medicine | Admitting: Emergency Medicine

## 2023-03-20 ENCOUNTER — Encounter (HOSPITAL_COMMUNITY): Payer: Self-pay

## 2023-03-20 ENCOUNTER — Emergency Department (HOSPITAL_COMMUNITY): Payer: Medicare HMO

## 2023-03-20 ENCOUNTER — Other Ambulatory Visit: Payer: Self-pay

## 2023-03-20 DIAGNOSIS — W01118A Fall on same level from slipping, tripping and stumbling with subsequent striking against other sharp object, initial encounter: Secondary | ICD-10-CM | POA: Diagnosis not present

## 2023-03-20 DIAGNOSIS — I6782 Cerebral ischemia: Secondary | ICD-10-CM | POA: Insufficient documentation

## 2023-03-20 DIAGNOSIS — S32030A Wedge compression fracture of third lumbar vertebra, initial encounter for closed fracture: Secondary | ICD-10-CM | POA: Insufficient documentation

## 2023-03-20 DIAGNOSIS — N189 Chronic kidney disease, unspecified: Secondary | ICD-10-CM | POA: Insufficient documentation

## 2023-03-20 DIAGNOSIS — J449 Chronic obstructive pulmonary disease, unspecified: Secondary | ICD-10-CM | POA: Insufficient documentation

## 2023-03-20 DIAGNOSIS — S01111A Laceration without foreign body of right eyelid and periocular area, initial encounter: Secondary | ICD-10-CM | POA: Insufficient documentation

## 2023-03-20 DIAGNOSIS — R0781 Pleurodynia: Secondary | ICD-10-CM | POA: Diagnosis not present

## 2023-03-20 DIAGNOSIS — Z7901 Long term (current) use of anticoagulants: Secondary | ICD-10-CM | POA: Diagnosis not present

## 2023-03-20 DIAGNOSIS — S3992XA Unspecified injury of lower back, initial encounter: Secondary | ICD-10-CM | POA: Diagnosis present

## 2023-03-20 DIAGNOSIS — W19XXXA Unspecified fall, initial encounter: Secondary | ICD-10-CM

## 2023-03-20 LAB — BASIC METABOLIC PANEL
Anion gap: 11 (ref 5–15)
BUN: 18 mg/dL (ref 8–23)
CO2: 26 mmol/L (ref 22–32)
Calcium: 8.5 mg/dL — ABNORMAL LOW (ref 8.9–10.3)
Chloride: 100 mmol/L (ref 98–111)
Creatinine, Ser: 1.58 mg/dL — ABNORMAL HIGH (ref 0.61–1.24)
GFR, Estimated: 43 mL/min — ABNORMAL LOW (ref >60)
Glucose, Bld: 110 mg/dL — ABNORMAL HIGH (ref 70–99)
Potassium: 4.8 mmol/L (ref 3.5–5.1)
Sodium: 137 mmol/L (ref 135–145)

## 2023-03-20 LAB — CBC
HCT: 45.9 % (ref 39.0–52.0)
Hemoglobin: 13.9 g/dL (ref 13.0–17.0)
MCH: 29.1 pg (ref 26.0–34.0)
MCHC: 30.3 g/dL (ref 30.0–36.0)
MCV: 96 fL (ref 80.0–100.0)
Platelets: 187 10*3/uL (ref 150–400)
RBC: 4.78 MIL/uL (ref 4.22–5.81)
RDW: 12.3 % (ref 11.5–15.5)
WBC: 10.2 10*3/uL (ref 4.0–10.5)
nRBC: 0 % (ref 0.0–0.2)

## 2023-03-20 LAB — I-STAT CHEM 8, ED
BUN: 26 mg/dL — ABNORMAL HIGH (ref 8–23)
Calcium, Ion: 1.08 mmol/L — ABNORMAL LOW (ref 1.15–1.40)
Chloride: 99 mmol/L (ref 98–111)
Creatinine, Ser: 1.6 mg/dL — ABNORMAL HIGH (ref 0.61–1.24)
Glucose, Bld: 112 mg/dL — ABNORMAL HIGH (ref 70–99)
HCT: 41 % (ref 39.0–52.0)
Hemoglobin: 13.9 g/dL (ref 13.0–17.0)
Potassium: 4.7 mmol/L (ref 3.5–5.1)
Sodium: 136 mmol/L (ref 135–145)
TCO2: 30 mmol/L (ref 22–32)

## 2023-03-20 MED ORDER — LIDOCAINE-EPINEPHRINE-TETRACAINE (LET) TOPICAL GEL
3.0000 mL | Freq: Once | TOPICAL | Status: AC
  Administered 2023-03-20: 3 mL via TOPICAL
  Filled 2023-03-20: qty 3

## 2023-03-20 MED ORDER — IOHEXOL 350 MG/ML SOLN
75.0000 mL | Freq: Once | INTRAVENOUS | Status: AC | PRN
  Administered 2023-03-20: 75 mL via INTRAVENOUS

## 2023-03-20 NOTE — ED Notes (Signed)
Laceration above right eye irrigated and cleansed. Bleeding controlled

## 2023-03-20 NOTE — ED Notes (Signed)
Ortho tech called per provider request for TLSO brace.

## 2023-03-20 NOTE — Discharge Instructions (Addendum)
We evaluated you for your fall.  You have a compression fracture in your back, which is a type of fracture you can have when you fall.  This is not a dangerous type of fracture.  We have given you a back brace to help with this.  We also repaired your laceration with skin glue.  Please do not scrub at this or try to peel it off.  Please follow-up with Dr. Maisie Fus for your back fracture.  Please take at 1000 mg of Tylenol every 6 hours as needed for pain.  You can also try over-the-counter lidocaine patches to help with this.  Please return to the emergency department you have any new or worsening symptoms such as trouble walking, numbness or tingling, weakness, incontinence, redness or pain at your wound, drainage, or any other new symptoms.

## 2023-03-20 NOTE — ED Triage Notes (Signed)
"  Fell from standing position in to sink around 1330. Hit right side of forehead and right ribs. Denies loss of consciousness. Family said he has some confusion at baseline and is at his baseline now. Laceration noted above right eye. Small skin tear to right arm. Has also complained of back pain but says that was present on and off before the fall" per EMS

## 2023-03-20 NOTE — ED Notes (Signed)
Patient transported to CT 

## 2023-03-20 NOTE — ED Provider Notes (Signed)
  ED Course / MDM   Clinical Course as of 03/20/23 2112  Fri Mar 20, 2023  1550 Received sign out from Dr. Lynelle Doctor pending trauma imaging. Had fall, hit head, complaining some flank pain also. [WS]  2109 Imaging shows L3 transverse process fracture L3 compression fracture with 3 mm retropulsion.  Patient placed in TLSO brace.  Patient able to ambulate without difficulty.  No weakness.  Discussed with Dr. Maisie Fus with neurosurgery who agrees with TLSO brace.  Patient also had 2 cm facial laceration to the right eyebrow which was repaired with Dermabond.  No other traumatic injuries.  Will discharge patient to home. All questions answered. Patient comfortable with plan of discharge. Return precautions discussed with patient and specified on the after visit summary.  [WS]    Clinical Course User Index [WS] Lonell Grandchild, MD   Medical Decision Making Amount and/or Complexity of Data Reviewed Labs: ordered. Radiology: ordered.  Risk Prescription drug management.   Marland Kitchen.Laceration Repair  Date/Time: 03/20/2023 9:11 PM  Performed by: Lonell Grandchild, MD Authorized by: Lonell Grandchild, MD   Consent:    Consent obtained:  Verbal   Consent given by:  Patient   Risks, benefits, and alternatives were discussed: yes     Risks discussed:  Infection, need for additional repair, nerve damage, pain, vascular damage, poor wound healing, poor cosmetic result, retained foreign body and tendon damage   Alternatives discussed:  No treatment Universal protocol:    Patient identity confirmed:  Verbally with patient and arm band Laceration details:    Location:  Face   Face location:  R eyebrow   Length (cm):  2 Treatment:    Area cleansed with:  Saline   Amount of cleaning:  Standard   Irrigation solution:  Sterile saline Skin repair:    Repair method:  Tissue adhesive Repair type:    Repair type:  Simple Post-procedure details:    Procedure completion:  Tolerated well, no immediate  complications .Ortho Injury Treatment  Date/Time: 03/20/2023 9:11 PM  Performed by: Lonell Grandchild, MD Authorized by: Lonell Grandchild, MD   Consent:    Consent obtained:  Verbal   Consent given by:  Patient   Risks discussed:  Restricted joint movement and stiffness   Alternatives discussed:  No treatmentInjury location: back. Pre-procedure neurovascular assessment: neurovascularly intact Pre-procedure distal perfusion: normal Pre-procedure neurological function: normal Pre-procedure range of motion: normal  Anesthesia: Local anesthesia used: no  Patient sedated: NoImmobilization: brace Splint type: TLSO. Splint Applied by: ED Provider and Ortho Tech Post-procedure neurovascular assessment: post-procedure neurovascularly intact Post-procedure distal perfusion: normal Post-procedure neurological function: normal Post-procedure range of motion: normal           Lonell Grandchild, MD 03/20/23 2112

## 2023-03-20 NOTE — Progress Notes (Signed)
Orthopedic Tech Progress Note Patient Details:  Richard Stanley 1935-06-26 [id number]  Ortho Devices Type of Ortho Device: Thoracolumbar corset (TLSO) Ortho Device/Splint Location: BACK Ortho Device/Splint Interventions: Ordered, Application, Adjustment   Post Interventions Patient Tolerated: Well, Ambulated well Instructions Provided: Poper ambulation with device, Care of device  [name] 03/20/2023, 8:56 PM

## 2023-03-20 NOTE — ED Provider Notes (Signed)
[place] EMERGENCY DEPARTMENT AT [facility] Provider Note   CSN: [id number] Arrival date & time: 03/20/23  1347     History  Chief complaint: Richard Stanley is a 87 y.o. male.  HPI   Patient has a history of chronic A-fib, depression, chronic kidney disease, COPD who presents to the ED for evaluation after fall.  Patient stumbled hitting his head on the sink.  He also hit his ribs.  Patient was activated as a level 2 trauma.  He is on anticoagulation.  Family noted the patient was at his mental baseline when he left the home.  Patient complaints of some dizziness and headache where he struck his head.  He is also having some pain in his right ribs.  He denies any pain in his extremities  Home Medications Prior to Admission medications   Medication Sig Start Date End Date Taking? Authorizing Provider  amiodarone (PACERONE) 200 MG tablet Take 100 mg by mouth daily.    [provider]  atorvastatin (LIPITOR) 20 MG tablet Take 1 tablet (20 mg total) by mouth daily at 6 PM. Patient taking differently: Take 20 mg by mouth daily. 02/26/22   Yates Decamp, MD  doxycycline (VIBRAMYCIN) 100 MG capsule Take 1 capsule (100 mg total) by mouth 2 (two) times daily. 01/10/23   Gwyneth Sprout, MD  FLUoxetine (PROZAC) 20 MG capsule Take 20 mg by mouth daily. 12/12/21   [provider]  guaiFENesin (MUCINEX) 600 MG 12 hr tablet Take 1 tablet (600 mg total) by mouth 2 (two) times daily as needed for cough. 03/06/22   Calvert Cantor, MD  nitrofurantoin, macrocrystal-monohydrate, (MACROBID) 100 MG capsule Take 100 mg by mouth 2 (two) times daily. 04/29/22   [provider]  Rivaroxaban (XARELTO) 15 MG TABS tablet Take 1 tablet (15 mg total) by mouth daily with supper. Patient taking differently: Take 15 mg by mouth daily. 11/22/21   Cantwell, Celeste C, PA-C      Allergies    Patient has no known allergies.    Review of Systems   Review of Systems  Physical  Exam Updated Vital Signs BP (!) 161/71   Pulse (!) 58   Temp 97.8 F (36.6 C) (Oral)   Resp 19   Ht 1.702 m (5\' 7" )   Wt 68 kg   SpO2 99%   BMI 23.49 kg/m  Physical Exam Vitals and nursing note reviewed.  Constitutional:      Appearance: He is well-developed. He is not diaphoretic.  HENT:     Head: Normocephalic.     Comments: Small approximate 1 cm laceration lateral to the right eye    Right Ear: External ear normal.     Left Ear: External ear normal.  Eyes:     General: No scleral icterus.       Right eye: No discharge.        Left eye: No discharge.     Conjunctiva/sclera: Conjunctivae normal.  Neck:     Trachea: No tracheal deviation.  Cardiovascular:     Rate and Rhythm: Normal rate and regular rhythm.  Pulmonary:     Effort: Pulmonary effort is normal. No respiratory distress.     Breath sounds: Normal breath sounds. No stridor. No wheezing or rales.  Chest:     Comments: Tenderness palpation right anterior and lateral lower margin of the ribs, no crepitus no deformity Abdominal:     General: Bowel sounds are normal. There is no distension.  Palpations: Abdomen is soft.     Tenderness: There is no abdominal tenderness. There is no guarding or rebound.  Musculoskeletal:        General: No tenderness or deformity.     Cervical back: Normal and neck supple.     Thoracic back: Normal.     Lumbar back: Normal.     Comments: Tenderness palpation bilateral upper extremities lower extremities  Skin:    General: Skin is warm and dry.     Findings: No rash.  Neurological:     General: No focal deficit present.     Mental Status: He is alert.     Cranial Nerves: No cranial nerve deficit, dysarthria or facial asymmetry.     Sensory: No sensory deficit.     Motor: No abnormal muscle tone or seizure activity.     Coordination: Coordination normal.  Psychiatric:        Mood and Affect: Mood normal.     ED Results / Procedures / Treatments   Labs (all labs  ordered are listed, but only abnormal results are displayed) Labs Reviewed  BASIC METABOLIC PANEL - Abnormal; Notable for the following components:      Result Value   Glucose, Bld 110 (*)    Creatinine, Ser 1.58 (*)    Calcium 8.5 (*)    GFR, Estimated 43 (*)    All other components within normal limits  I-STAT CHEM 8, ED - Abnormal; Notable for the following components:   BUN 26 (*)    Creatinine, Ser 1.60 (*)    Glucose, Bld 112 (*)    Calcium, Ion 1.08 (*)    All other components within normal limits  CBC    EKG EKG Interpretation Date/Time:  Friday March 20 2023 17:31:06 EDT Ventricular Rate:  58 PR Interval:  161 QRS Duration:  97 QT Interval:  464 QTC Calculation: 456 R Axis:   -78  Text Interpretation: Sinus rhythm Left axis deviation No significant change since last tracing Confirmed by Linwood Dibbles [phone]) on 03/20/2023 5:47:08 PM  Radiology DG Pelvis Portable  Result Date: 03/20/2023 CLINICAL DATA:  Pain after trauma EXAM: PORTABLE PELVIS 1 VIEWS COMPARISON:  X-ray 12/21/2022 FINDINGS: Severe osteopenia. No fracture or dislocation. Mild degenerative changes with joint space loss along the hips and sacroiliac joints. Sclerotic bone lesions again seen along the left iliac bone, possible bone islands. Degenerative changes of the lower lumbar spine. If there is persistent pain or further concern of injury with this level of osteopenia recommend follow up CT or MRI to further delineate for subtle injury IMPRESSION: Severe osteopenia.  Degenerative changes. Electronically Signed   By: Karen Kays M.D.   On: 03/20/2023 15:53   DG Chest Port 1 View  Result Date: 03/20/2023 CLINICAL DATA:  Pain after trauma EXAM: PORTABLE CHEST 1 VIEW COMPARISON:  01/10/2023 FINDINGS: No consolidation, pneumothorax or effusion. No edema. Normal cardiopericardial silhouette. Overlapping cardiac leads. Degenerative changes of the spine. Osteopenia. IMPRESSION: No acute cardiopulmonary disease.  Electronically Signed   By: Karen Kays M.D.   On: 03/20/2023 15:51    Procedures Procedures    Medications Ordered in ED Medications  lidocaine-EPINEPHrine-tetracaine (LET) topical gel (3 mLs Topical Given 03/20/23 1626)  iohexol (OMNIPAQUE) 350 MG/ML injection 75 mL (75 mLs Intravenous Contrast Given 03/20/23 1643)    ED Course/ Medical Decision Making/ A&P Clinical Course as of 03/20/23 1747  Fri Mar 20, 2023  1550 Received sign out from Dr. Lynelle Doctor pending trauma imaging. Had  fall, hit head, complaining some flank pain also. [WS]    Clinical Course User Index [WS] Lonell Grandchild, MD                                 Medical Decision Making Ddx includes but not limited to close head injury, rib fx, pneumothorax.  Amount and/or Complexity of Data Reviewed Labs: ordered. Radiology: ordered.  Risk Prescription drug management.   Pt presented to the ED after a fall  Striking head and ribs.  Level 2 due to anticoagulant use.  Imaging tests and labs pending at shift change.  Care turned over to Dr Suezanne Jacquet        Final Clinical Impression(s) / ED Diagnoses Final diagnoses:  None    Rx / DC Orders ED Discharge Orders     None         Linwood Dibbles, MD 03/20/23 [phone]

## 2023-03-21 NOTE — ED Notes (Signed)
Pt provided with sandwich bag

## 2023-03-29 ENCOUNTER — Emergency Department (HOSPITAL_COMMUNITY)
Admission: EM | Admit: 2023-03-29 | Discharge: 2023-03-29 | Disposition: A | Discharge status: Home / Self Care | Payer: Medicare HMO | Attending: Emergency Medicine | Admitting: Emergency Medicine

## 2023-03-29 ENCOUNTER — Other Ambulatory Visit: Payer: Self-pay

## 2023-03-29 ENCOUNTER — Emergency Department (HOSPITAL_COMMUNITY): Payer: Medicare HMO

## 2023-03-29 DIAGNOSIS — S0083XA Contusion of other part of head, initial encounter: Secondary | ICD-10-CM | POA: Insufficient documentation

## 2023-03-29 DIAGNOSIS — R443 Hallucinations, unspecified: Secondary | ICD-10-CM | POA: Insufficient documentation

## 2023-03-29 DIAGNOSIS — J449 Chronic obstructive pulmonary disease, unspecified: Secondary | ICD-10-CM | POA: Insufficient documentation

## 2023-03-29 DIAGNOSIS — N189 Chronic kidney disease, unspecified: Secondary | ICD-10-CM | POA: Diagnosis not present

## 2023-03-29 DIAGNOSIS — W19XXXA Unspecified fall, initial encounter: Secondary | ICD-10-CM | POA: Diagnosis not present

## 2023-03-29 DIAGNOSIS — R410 Disorientation, unspecified: Secondary | ICD-10-CM | POA: Diagnosis not present

## 2023-03-29 DIAGNOSIS — Z7901 Long term (current) use of anticoagulants: Secondary | ICD-10-CM | POA: Diagnosis not present

## 2023-03-29 DIAGNOSIS — S0993XA Unspecified injury of face, initial encounter: Secondary | ICD-10-CM | POA: Diagnosis present

## 2023-03-29 DIAGNOSIS — F0390 Unspecified dementia without behavioral disturbance: Secondary | ICD-10-CM | POA: Insufficient documentation

## 2023-03-29 LAB — CBC WITH DIFFERENTIAL/PLATELET
Abs Immature Granulocytes: 0.04 10*3/uL (ref 0.00–0.07)
Basophils Absolute: 0 10*3/uL (ref 0.0–0.1)
Basophils Relative: 0 %
Eosinophils Absolute: 0.2 10*3/uL (ref 0.0–0.5)
Eosinophils Relative: 3 %
HCT: 42.5 % (ref 39.0–52.0)
Hemoglobin: 13.9 g/dL (ref 13.0–17.0)
Immature Granulocytes: 1 %
Lymphocytes Relative: 11 %
Lymphs Abs: 0.9 10*3/uL (ref 0.7–4.0)
MCH: 29.7 pg (ref 26.0–34.0)
MCHC: 32.7 g/dL (ref 30.0–36.0)
MCV: 90.8 fL (ref 80.0–100.0)
Monocytes Absolute: 0.9 10*3/uL (ref 0.1–1.0)
Monocytes Relative: 11 %
Neutro Abs: 6.2 10*3/uL (ref 1.7–7.7)
Neutrophils Relative %: 74 %
Platelets: 268 10*3/uL (ref 150–400)
RBC: 4.68 MIL/uL (ref 4.22–5.81)
RDW: 12.4 % (ref 11.5–15.5)
WBC: 8.3 10*3/uL (ref 4.0–10.5)
nRBC: 0 % (ref 0.0–0.2)

## 2023-03-29 LAB — COMPREHENSIVE METABOLIC PANEL
ALT: 40 U/L (ref 0–44)
AST: 25 U/L (ref 15–41)
Albumin: 3.2 g/dL — ABNORMAL LOW (ref 3.5–5.0)
Alkaline Phosphatase: 95 U/L (ref 38–126)
Anion gap: 12 (ref 5–15)
BUN: 19 mg/dL (ref 8–23)
CO2: 28 mmol/L (ref 22–32)
Calcium: 9.2 mg/dL (ref 8.9–10.3)
Chloride: 93 mmol/L — ABNORMAL LOW (ref 98–111)
Creatinine, Ser: 1.65 mg/dL — ABNORMAL HIGH (ref 0.61–1.24)
GFR, Estimated: 40 mL/min — ABNORMAL LOW (ref >60)
Glucose, Bld: 118 mg/dL — ABNORMAL HIGH (ref 70–99)
Potassium: 4.2 mmol/L (ref 3.5–5.1)
Sodium: 133 mmol/L — ABNORMAL LOW (ref 135–145)
Total Bilirubin: 0.7 mg/dL (ref 0.3–1.2)
Total Protein: 6 g/dL — ABNORMAL LOW (ref 6.5–8.1)

## 2023-03-29 LAB — URINALYSIS, ROUTINE W REFLEX MICROSCOPIC
Bilirubin Urine: NEGATIVE
Glucose, UA: NEGATIVE mg/dL
Ketones, ur: NEGATIVE mg/dL
Leukocytes,Ua: NEGATIVE
Nitrite: NEGATIVE
Protein, ur: NEGATIVE mg/dL
Specific Gravity, Urine: 1.01 (ref 1.005–1.030)
pH: 6 (ref 5.0–8.0)

## 2023-03-29 MED ORDER — CEPHALEXIN 500 MG PO CAPS: 500.0000 mg | ORAL_CAPSULE | Freq: Two times a day (BID) | ORAL | 0 refills | Status: AC

## 2023-03-29 NOTE — ED Notes (Signed)
Patient transported to X-ray 

## 2023-03-29 NOTE — Discharge Instructions (Addendum)
Make sure to call the primary care physician first thing in the morning on Monday morning in regards to his hallucinations.  He may need his medication list reviewed.  The urine test was questionable today.  It is not definitively a urinary tract infection but out of caution we will put him on some antibiotics and send his urine for culture.  If he develops new or worsening symptoms, fever, vomiting, or any other new/concerning symptoms then return to the ER or call 911.
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
## 2023-03-29 NOTE — ED Notes (Signed)
Patient transported to CT 

## 2023-03-29 NOTE — ED Triage Notes (Addendum)
Patient's spouse reported increased confusion  and disoriented this morning , seeing things , denies pain/respirations unlabored. Baseline is intermittent confusion/mild dementia .

## 2023-03-30 LAB — URINE CULTURE: Culture: NO GROWTH

## 2023-06-10 ENCOUNTER — Emergency Department (HOSPITAL_COMMUNITY)
Admission: EM | Admit: 2023-06-10 | Discharge: 2023-06-11 | Disposition: A | Discharge status: Home / Self Care | Payer: Medicare HMO | Attending: Emergency Medicine | Admitting: Emergency Medicine

## 2023-06-10 ENCOUNTER — Other Ambulatory Visit: Payer: Self-pay

## 2023-06-10 DIAGNOSIS — M545 Low back pain, unspecified: Secondary | ICD-10-CM | POA: Insufficient documentation

## 2023-06-10 DIAGNOSIS — M25562 Pain in left knee: Secondary | ICD-10-CM | POA: Diagnosis not present

## 2023-06-10 DIAGNOSIS — N189 Chronic kidney disease, unspecified: Secondary | ICD-10-CM | POA: Diagnosis not present

## 2023-06-10 DIAGNOSIS — R410 Disorientation, unspecified: Secondary | ICD-10-CM | POA: Insufficient documentation

## 2023-06-10 DIAGNOSIS — J449 Chronic obstructive pulmonary disease, unspecified: Secondary | ICD-10-CM | POA: Diagnosis not present

## 2023-06-10 DIAGNOSIS — Z7901 Long term (current) use of anticoagulants: Secondary | ICD-10-CM | POA: Diagnosis not present

## 2023-06-10 DIAGNOSIS — W19XXXA Unspecified fall, initial encounter: Secondary | ICD-10-CM | POA: Diagnosis not present

## 2023-06-10 DIAGNOSIS — M25561 Pain in right knee: Secondary | ICD-10-CM | POA: Insufficient documentation

## 2023-06-10 NOTE — ED Triage Notes (Signed)
 Patient reports low back and bilateral knee pain for several weeks after a fall .

## 2023-06-11 ENCOUNTER — Emergency Department (HOSPITAL_COMMUNITY): Payer: Medicare HMO

## 2023-06-11 LAB — BASIC METABOLIC PANEL
Anion gap: 10 (ref 5–15)
BUN: 36 mg/dL — ABNORMAL HIGH (ref 8–23)
CO2: 28 mmol/L (ref 22–32)
Calcium: 8.9 mg/dL (ref 8.9–10.3)
Chloride: 100 mmol/L (ref 98–111)
Creatinine, Ser: 1.98 mg/dL — ABNORMAL HIGH (ref 0.61–1.24)
GFR, Estimated: 32 mL/min — ABNORMAL LOW (ref >60)
Glucose, Bld: 147 mg/dL — ABNORMAL HIGH (ref 70–99)
Potassium: 4.4 mmol/L (ref 3.5–5.1)
Sodium: 138 mmol/L (ref 135–145)

## 2023-06-11 LAB — CBC
HCT: 48.9 % (ref 39.0–52.0)
Hemoglobin: 15.7 g/dL (ref 13.0–17.0)
MCH: 29.5 pg (ref 26.0–34.0)
MCHC: 32.1 g/dL (ref 30.0–36.0)
MCV: 91.9 fL (ref 80.0–100.0)
Platelets: 131 10*3/uL — ABNORMAL LOW (ref 150–400)
RBC: 5.32 MIL/uL (ref 4.22–5.81)
RDW: 13 % (ref 11.5–15.5)
WBC: 10.4 10*3/uL (ref 4.0–10.5)
nRBC: 0 % (ref 0.0–0.2)

## 2023-06-11 MED ORDER — SODIUM CHLORIDE 0.9 % IV BOLUS
1000.0000 mL | Freq: Once | INTRAVENOUS | Status: AC
  Administered 2023-06-11: 1000 mL via INTRAVENOUS

## 2023-06-11 MED ORDER — ACETAMINOPHEN 325 MG PO TABS
650.0000 mg | ORAL_TABLET | Freq: Once | ORAL | Status: AC
  Administered 2023-06-11: 650 mg via ORAL
  Filled 2023-06-11: qty 2

## 2023-06-11 NOTE — ED Notes (Signed)
 Patient transported to CT

## 2023-06-11 NOTE — ED Provider Notes (Signed)
 [facility] DE[facility]MENT AT [facility] Provider Note   CSN: [id number] Arrival date & time: 06/10/23  2252     History  Chief Complaint  Patient presents with   Back/Knee Pain     Richard Stanley is a 88 y.o. male with past medical history seen for A-fib, hyperlipidemia, CKD, COPD, anxiety, depression who presents concern for low back and bilateral knee pain for several weeks after fall.  Wife reports that he had an unwitnessed fall, she is unsure whether he struck his head.  Patient wife reports that he has had multiple falls, most recently was just a few days ago.  She reports that he has somewhat confused, garbled speech at baseline but that it seems somewhat worse recently.  Nothing for pain given at home, patient's wife reports that their daughter does not believe in it.  HPI     Home Medications Prior to Admission medications   Medication Sig Start Date End Date Taking? Authorizing Provider  amiodarone  (PACERONE ) 200 MG tablet Take 100 mg by mouth daily.    [provider]  atorvastatin  (LIPITOR) 20 MG tablet Take 1 tablet (20 mg total) by mouth daily at 6 PM. Patient taking differently: Take 20 mg by mouth daily. 02/26/22   Ladona Heinz, MD  FLUoxetine  (PROZAC ) 20 MG capsule Take 20 mg by mouth daily. 12/12/21   [provider]  guaiFENesin  (MUCINEX ) 600 MG 12 hr tablet Take 1 tablet (600 mg total) by mouth 2 (two) times daily as needed for cough. 03/06/22   Rizwan, Saima, MD  nitrofurantoin, macrocrystal-monohydrate, (MACROBID) 100 MG capsule Take 100 mg by mouth 2 (two) times daily. 04/29/22   [provider]  Rivaroxaban  (XARELTO ) 15 MG TABS tablet Take 1 tablet (15 mg total) by mouth daily with supper. Patient taking differently: Take 15 mg by mouth daily. 11/22/21   Cantwell, Celeste C, PA-C      Allergies    Patient has no known allergies.    Review of Systems   Review of Systems  All other systems reviewed and are  negative.   Physical Exam Updated Vital Signs BP (!) 148/86 (BP Location: Right Arm)   Pulse 80   Temp 97.7 F (36.5 C)   Resp 20   SpO2 97%  Physical Exam Vitals and nursing note reviewed.  Constitutional:      General: He is not in acute distress.    Appearance: Normal appearance.  HENT:     Head: Normocephalic and atraumatic.  Eyes:     General:        Right eye: No discharge.        Left eye: No discharge.  Cardiovascular:     Rate and Rhythm: Normal rate and regular rhythm.     Heart sounds: No murmur heard.    No friction rub. No gallop.  Pulmonary:     Effort: Pulmonary effort is normal.     Breath sounds: Normal breath sounds.  Abdominal:     General: Bowel sounds are normal.     Palpations: Abdomen is soft.  Musculoskeletal:     Comments: Some mild tenderness to palpation of bilateral hips, bilateral knees with no step-off, deformity.  Moves all 4 limbs spontaneously, intact strength 5/5.  Skin:    General: Skin is warm and dry.     Capillary Refill: Capillary refill takes less than 2 seconds.  Neurological:     Mental Status: He is alert and oriented to person, place,  and time.     Comments: Somewhat garbled, confused speech which wife reports is slightly worse than baseline although he does have some garbled speech at baseline.  Psychiatric:        Mood and Affect: Mood normal.        Behavior: Behavior normal.     ED Results / Procedures / Treatments   Labs (all labs ordered are listed, but only abnormal results are displayed) Labs Reviewed  CBC - Abnormal; Notable for the following components:      Result Value   Platelets 131 (*)    All other components within normal limits  BASIC METABOLIC PANEL - Abnormal; Notable for the following components:   Glucose, Bld 147 (*)    BUN 36 (*)    Creatinine, Ser 1.98 (*)    GFR, Estimated 32 (*)    All other components within normal limits    EKG None  Radiology DG Chest 1 View Result Date:  06/11/2023 CLINICAL DATA:  88 year old male with history of bilateral hip and knee pain after a fall. EXAM: CHEST  1 VIEW COMPARISON:  Chest x-ray 03/29/2023. FINDINGS: Lung volumes are normal. No consolidative airspace disease. No pleural effusions. No pneumothorax. No pulmonary nodule or mass noted. Pulmonary vasculature and the cardiomediastinal silhouette are within normal limits. Multiple old healed right-sided rib fractures incidentally noted. IMPRESSION: 1.  No radiographic evidence of acute cardiopulmonary disease. 2. Multiple old healed right-sided rib fractures. Electronically Signed   By: Toribio Aye M.D.   On: 06/11/2023 07:52   DG Knee 2 Views Left Result Date: 06/11/2023 CLINICAL DATA:  88 year old male with history of trauma from a fall complaining of left knee pain. EXAM: LEFT KNEE - 1-2 VIEW COMPARISON:  No priors. FINDINGS: No evidence of fracture, dislocation, or joint effusion. No evidence of arthropathy or other focal bone abnormality. Soft tissues are unremarkable. IMPRESSION: Negative. Electronically Signed   By: Toribio Aye M.D.   On: 06/11/2023 07:51   DG Knee 2 Views Right Result Date: 06/11/2023 CLINICAL DATA:  88 year old male with history of trauma from a fall complaining of right knee pain. EXAM: RIGHT KNEE - 1-2 VIEW COMPARISON:  No priors. FINDINGS: No evidence of fracture, dislocation, or joint effusion. No evidence of arthropathy or other focal bone abnormality. Soft tissues are unremarkable. IMPRESSION: Negative. Electronically Signed   By: Toribio Aye M.D.   On: 06/11/2023 07:50   DG Hips Bilat W or Wo Pelvis 2 Views Result Date: 06/11/2023 CLINICAL DATA:  88 year old male with history of trauma from a fall complaining of bilateral hip pain. EXAM: DG HIP (WITH OR WITHOUT PELVIS) 2V BILAT COMPARISON:  No priors. FINDINGS: There is no evidence of hip fracture or dislocation. Joint space narrowing, subchondral sclerosis, subchondral cyst formation and osteophyte  formation are noted in both hip joints, indicative of osteoarthritis. IMPRESSION: 1. No acute radiographic abnormality of the bony pelvis or either hip. 2. Bilateral hip joint osteoarthritis. Electronically Signed   By: Toribio Aye M.D.   On: 06/11/2023 07:50   CT Head Wo Contrast Result Date: 06/11/2023 CLINICAL DATA:  Neck trauma, fall with no visible injuries. EXAM: CT HEAD WITHOUT CONTRAST CT CERVICAL SPINE WITHOUT CONTRAST TECHNIQUE: Multidetector CT imaging of the head and cervical spine was performed following the standard protocol without intravenous contrast. Multiplanar CT image reconstructions of the cervical spine were also generated. RADIATION DOSE REDUCTION: This exam was performed according to the departmental dose-optimization program which includes automated exposure control, adjustment of  the mA and/or kV according to patient size and/or use of iterative reconstruction technique. COMPARISON:  03/29/2023 head CT FINDINGS: CT HEAD FINDINGS Brain: No evidence of acute infarction, hemorrhage, hydrocephalus, extra-axial collection or mass lesion/mass effect. Chronic small vessel disease in the cerebral white matter. Brain atrophy with sylvian fissure widening. Vascular: No hyperdense vessel or unexpected calcification. Skull: Normal. Negative for fracture or focal lesion. No evidence of injury. Sinuses/Orbits: Patchy mucosal thickening in the paranasal sinuses. CT CERVICAL SPINE FINDINGS Alignment: Normal. Skull base and vertebrae: No acute fracture. No primary bone lesion or focal pathologic process. Soft tissues and spinal canal: No prevertebral fluid or swelling. No visible canal hematoma. Disc levels:  Ordinary degenerative change which is mild for age. Upper chest: 4 mm nodule at the left apex, chronic when compared to a 2012 chest CT. IMPRESSION: No evidence of acute intracranial or cervical spine injury. Electronically Signed   By: Dorn Roulette M.D.   On: 06/11/2023 07:30   CT  Cervical Spine Wo Contrast Result Date: 06/11/2023 CLINICAL DATA:  Neck trauma, fall with no visible injuries. EXAM: CT HEAD WITHOUT CONTRAST CT CERVICAL SPINE WITHOUT CONTRAST TECHNIQUE: Multidetector CT imaging of the head and cervical spine was performed following the standard protocol without intravenous contrast. Multiplanar CT image reconstructions of the cervical spine were also generated. RADIATION DOSE REDUCTION: This exam was performed according to the departmental dose-optimization program which includes automated exposure control, adjustment of the mA and/or kV according to patient size and/or use of iterative reconstruction technique. COMPARISON:  03/29/2023 head CT FINDINGS: CT HEAD FINDINGS Brain: No evidence of acute infarction, hemorrhage, hydrocephalus, extra-axial collection or mass lesion/mass effect. Chronic small vessel disease in the cerebral white matter. Brain atrophy with sylvian fissure widening. Vascular: No hyperdense vessel or unexpected calcification. Skull: Normal. Negative for fracture or focal lesion. No evidence of injury. Sinuses/Orbits: Patchy mucosal thickening in the paranasal sinuses. CT CERVICAL SPINE FINDINGS Alignment: Normal. Skull base and vertebrae: No acute fracture. No primary bone lesion or focal pathologic process. Soft tissues and spinal canal: No prevertebral fluid or swelling. No visible canal hematoma. Disc levels:  Ordinary degenerative change which is mild for age. Upper chest: 4 mm nodule at the left apex, chronic when compared to a 2012 chest CT. IMPRESSION: No evidence of acute intracranial or cervical spine injury. Electronically Signed   By: Dorn Roulette M.D.   On: 06/11/2023 07:30    Procedures Procedures    Medications Ordered in ED Medications  acetaminophen  (TYLENOL ) tablet 650 mg (650 mg Oral Given 06/11/23 0706)  sodium chloride  0.9 % bolus 1,000 mL (1,000 mLs Intravenous New Bag/Given 06/11/23 0745)    ED Course/ Medical Decision Making/  A&P Clinical Course as of 06/11/23 0914  Thu Jun 11, 2023  0733 Mild AKI, will administer fluid bolus, lab work otherwise unremarkable other than mild thrombocytopenia, platelets 131. [CP]    Clinical Course User Index [CP] Rosan Sherlean DEL, PA-C                                 Medical Decision Making  This patient is a 88 y.o. male  who presents to the ED for concern of fall, aches.   Differential diagnoses prior to evaluation: The emergent differential diagnosis includes, but is not limited to,  fracture, dislocation, head injury, considered reasons for fall including dehydration, dementia, confusion, electrolyte abnormality, sepsis, vs other . This is not an exhaustive  differential.   Past Medical History / Co-morbidities / Social History: A-fib, hyperlipidemia, CKD, COPD, anxiety, depression  Physical Exam: Physical exam performed. The pertinent findings include: Some mild tenderness to palpation of bilateral hips, bilateral knees with no step-off, deformity.  Moves all 4 limbs spontaneously, intact strength 5/5.    Somewhat garbled, confused speech which wife reports is slightly worse than baseline although he does have some garbled speech at baseline.  Lab Tests/Imaging studies: I personally interpreted labs/imaging and the pertinent results include: CBC unremarkable other than mild thrombocytopenia platelets 131, BMP notable for mildly elevated BUN, creatinine from baseline, we will administer fluid bolus to rehydrate, encourage PCP recheck.  I independently interpreted plain film radiograph of the chest, left knee, right knee, bilateral hips, as well as CT of the head and C-spine which shows no evidence of acute fracture, dislocation, notable for old healed right rib fractures.SABRA GRIST agree with the radiologist interpretation.  Cardiac monitoring: EKG obtained and interpreted by myself and attending physician which shows: A-fib with normal rate   Medications: I ordered  medication including fluid bolus, Tylenol .  I have reviewed the patients home medicines and have made adjustments as needed.   Disposition: After consideration of the diagnostic results and the patients response to treatment, I feel that patient with no evidence of acute abnormality at this time, some body soreness from old falls, stable for discharge with PCP follow-up for kidney function recheck.   emergency department workup does not suggest an emergent condition requiring admission or immediate intervention beyond what has been performed at this time. The plan is: as above. The patient is safe for discharge and has been instructed to return immediately for worsening symptoms, change in symptoms or any other concerns.  Final Clinical Impression(s) / ED Diagnoses Final diagnoses:  Fall, initial encounter    Rx / DC Orders ED Discharge Orders     None         Rosan Sherlean VEAR DEVONNA 06/11/23 0914    Theadore Ozell HERO, MD 06/15/23 [phone]

## 2023-06-11 NOTE — Discharge Instructions (Signed)
 Please use Tylenol or ibuprofen for pain.  You may use 600 mg ibuprofen every 6 hours or 1000 mg of Tylenol every 6 hours.  You may choose to alternate between the 2.  This would be most effective.  Not to exceed 4 g of Tylenol within 24 hours.  Not to exceed 3200 mg ibuprofen 24 hours.

## 2023-07-10 ENCOUNTER — Emergency Department (HOSPITAL_COMMUNITY)
Admission: EM | Admit: 2023-07-10 | Discharge: 2023-07-11 | Disposition: A | Discharge status: Home / Self Care | Payer: Medicare HMO | Attending: Emergency Medicine | Admitting: Emergency Medicine

## 2023-07-10 ENCOUNTER — Emergency Department (HOSPITAL_COMMUNITY): Payer: Medicare HMO

## 2023-07-10 ENCOUNTER — Encounter (HOSPITAL_COMMUNITY): Payer: Self-pay | Admitting: Emergency Medicine

## 2023-07-10 ENCOUNTER — Other Ambulatory Visit: Payer: Self-pay

## 2023-07-10 DIAGNOSIS — Z7901 Long term (current) use of anticoagulants: Secondary | ICD-10-CM | POA: Diagnosis not present

## 2023-07-10 DIAGNOSIS — N189 Chronic kidney disease, unspecified: Secondary | ICD-10-CM | POA: Diagnosis not present

## 2023-07-10 DIAGNOSIS — I129 Hypertensive chronic kidney disease with stage 1 through stage 4 chronic kidney disease, or unspecified chronic kidney disease: Secondary | ICD-10-CM | POA: Insufficient documentation

## 2023-07-10 DIAGNOSIS — J45909 Unspecified asthma, uncomplicated: Secondary | ICD-10-CM | POA: Insufficient documentation

## 2023-07-10 DIAGNOSIS — R319 Hematuria, unspecified: Secondary | ICD-10-CM | POA: Insufficient documentation

## 2023-07-10 LAB — CBC WITH DIFFERENTIAL/PLATELET
Abs Immature Granulocytes: 0.04 10*3/uL (ref 0.00–0.07)
Basophils Absolute: 0 10*3/uL (ref 0.0–0.1)
Basophils Relative: 0 %
Eosinophils Absolute: 0.1 10*3/uL (ref 0.0–0.5)
Eosinophils Relative: 1 %
HCT: 47.1 % (ref 39.0–52.0)
Hemoglobin: 15.1 g/dL (ref 13.0–17.0)
Immature Granulocytes: 0 %
Lymphocytes Relative: 9 %
Lymphs Abs: 0.9 10*3/uL (ref 0.7–4.0)
MCH: 29.8 pg (ref 26.0–34.0)
MCHC: 32.1 g/dL (ref 30.0–36.0)
MCV: 92.9 fL (ref 80.0–100.0)
Monocytes Absolute: 0.9 10*3/uL (ref 0.1–1.0)
Monocytes Relative: 8 %
Neutro Abs: 8.8 10*3/uL — ABNORMAL HIGH (ref 1.7–7.7)
Neutrophils Relative %: 82 %
Platelets: 229 10*3/uL (ref 150–400)
RBC: 5.07 MIL/uL (ref 4.22–5.81)
RDW: 14.2 % (ref 11.5–15.5)
WBC: 10.7 10*3/uL — ABNORMAL HIGH (ref 4.0–10.5)
nRBC: 0 % (ref 0.0–0.2)

## 2023-07-10 LAB — COMPREHENSIVE METABOLIC PANEL
ALT: 26 U/L (ref 0–44)
AST: 26 U/L (ref 15–41)
Albumin: 3.4 g/dL — ABNORMAL LOW (ref 3.5–5.0)
Alkaline Phosphatase: 174 U/L — ABNORMAL HIGH (ref 38–126)
Anion gap: 12 (ref 5–15)
BUN: 28 mg/dL — ABNORMAL HIGH (ref 8–23)
CO2: 26 mmol/L (ref 22–32)
Calcium: 9 mg/dL (ref 8.9–10.3)
Chloride: 100 mmol/L (ref 98–111)
Creatinine, Ser: 1.82 mg/dL — ABNORMAL HIGH (ref 0.61–1.24)
GFR, Estimated: 36 mL/min — ABNORMAL LOW (ref >60)
Glucose, Bld: 113 mg/dL — ABNORMAL HIGH (ref 70–99)
Potassium: 4.4 mmol/L (ref 3.5–5.1)
Sodium: 138 mmol/L (ref 135–145)
Total Bilirubin: 0.7 mg/dL (ref 0.0–1.2)
Total Protein: 6.5 g/dL (ref 6.5–8.1)

## 2023-07-10 MED ORDER — IOHEXOL 350 MG/ML SOLN
60.0000 mL | Freq: Once | INTRAVENOUS | Status: AC | PRN
  Administered 2023-07-10: 60 mL via INTRAVENOUS

## 2023-07-10 NOTE — ED Provider Triage Note (Signed)
Emergency Medicine Provider Triage Evaluation Note  Richard Stanley , a 88 y.o. male  was evaluated in triage.  Pt complains of hematuria.  Review of Systems  Positive:  Negative:   Physical Exam  BP 120/87 (BP Location: Right Arm)   Pulse 94   Temp 98.1 F (36.7 C)   Resp (!) 23   Ht 5\' 7"  (1.702 m)   Wt 68 kg   SpO2 98%   BMI 23.48 kg/m  Gen:   Awake, no distress   Resp:  Normal effort  MSK:   Moves extremities without difficulty  Other:    Medical Decision Making  Medically screening exam initiated at 5:40 PM.  Appropriate orders placed.  Richard Stanley was informed that the remainder of the evaluation will be completed by another provider, this initial triage assessment does not replace that evaluation, and the importance of remaining in the ED until their evaluation is complete.  Hematuria x3 days. Denies dysuria. Denies abdominal pain. Denies fever, nausea, vomiting, diarrhea.   Dorthy Cooler, [place] 07/10/23 1742

## 2023-07-10 NOTE — ED Triage Notes (Signed)
PT complains of blood in urine for 2 days. Denies any pain. No other complaints at this time.

## 2023-07-11 LAB — URINALYSIS, ROUTINE W REFLEX MICROSCOPIC
RBC / HPF: >50 RBC/hpf (ref 0–5)
WBC, UA: >50 WBC/hpf (ref 0–5)

## 2023-07-11 MED ORDER — HYDROCODONE-ACETAMINOPHEN 5-325 MG PO TABS
1.0000 | ORAL_TABLET | Freq: Once | ORAL | Status: AC
  Administered 2023-07-11: 1 via ORAL
  Filled 2023-07-11: qty 1

## 2023-07-11 MED ORDER — CEPHALEXIN 500 MG PO CAPS: 500.0000 mg | ORAL_CAPSULE | Freq: Two times a day (BID) | ORAL | 0 refills | Status: DC

## 2023-07-11 MED ORDER — CEPHALEXIN 250 MG PO CAPS
500.0000 mg | ORAL_CAPSULE | Freq: Once | ORAL | Status: AC
  Administered 2023-07-11: 500 mg via ORAL
  Filled 2023-07-11: qty 2

## 2023-07-11 NOTE — ED Provider Notes (Signed)
[place] EMERGENCY DEPARTMENT AT [facility] Provider Note   CSN: [id number] Arrival date & time: 07/10/23  1603     History  Chief Complaint  Patient presents with   Hematuria    Richard Stanley is a 88 y.o. male.  The history is provided by the patient and medical records.  Hematuria   88 year old male with history of A-fib on Xarelto, anxiety, asthma, CKD, depression, hypertension, hyperlipidemia, presenting to the ED for hematuria.  This has been ongoing for the past 2 days.  No passage of clots.  Blood seems bright red in color.  He is not having any pain with urination.  No fever or chills.  No history of same.  Home Medications Prior to Admission medications   Medication Sig Start Date End Date Taking? Authorizing Provider  cephALEXin (KEFLEX) 500 MG capsule Take 1 capsule (500 mg total) by mouth 2 (two) times daily. 07/11/23  Yes Garlon Hatchet, PA-C  amiodarone (PACERONE) 200 MG tablet Take 100 mg by mouth daily.    [provider]  atorvastatin (LIPITOR) 20 MG tablet Take 1 tablet (20 mg total) by mouth daily at 6 PM. Patient taking differently: Take 20 mg by mouth daily. 02/26/22   Yates Decamp, MD  FLUoxetine (PROZAC) 20 MG capsule Take 20 mg by mouth daily. 12/12/21   [provider]  guaiFENesin (MUCINEX) 600 MG 12 hr tablet Take 1 tablet (600 mg total) by mouth 2 (two) times daily as needed for cough. 03/06/22   Calvert Cantor, MD  nitrofurantoin, macrocrystal-monohydrate, (MACROBID) 100 MG capsule Take 100 mg by mouth 2 (two) times daily. 04/29/22   [provider]  Rivaroxaban (XARELTO) 15 MG TABS tablet Take 1 tablet (15 mg total) by mouth daily with supper. Patient taking differently: Take 15 mg by mouth daily. 11/22/21   Cantwell, Celeste C, PA-C      Allergies    Patient has no known allergies.    Review of Systems   Review of Systems  Genitourinary:  Positive for hematuria.  All other systems reviewed and are  negative.   Physical Exam Updated Vital Signs BP 132/82 (BP Location: Right Arm)   Pulse 84   Temp 97.9 F (36.6 C) (Oral)   Resp 17   Ht 5\' 7"  (1.702 m)   Wt 68 kg   SpO2 93%   BMI 23.48 kg/m   Physical Exam Vitals and nursing note reviewed.  Constitutional:      Appearance: He is well-developed.     Comments: Elderly, hard of hearing  HENT:     Head: Normocephalic and atraumatic.  Eyes:     Conjunctiva/sclera: Conjunctivae normal.     Pupils: Pupils are equal, round, and reactive to light.  Cardiovascular:     Rate and Rhythm: Normal rate and regular rhythm.     Heart sounds: Normal heart sounds.  Pulmonary:     Effort: Pulmonary effort is normal. No respiratory distress.     Breath sounds: Normal breath sounds. No rhonchi.  Abdominal:     General: Bowel sounds are normal.     Palpations: Abdomen is soft.     Tenderness: There is no abdominal tenderness. There is no rebound.     Comments: Soft, nontender  Musculoskeletal:        General: Normal range of motion.     Cervical back: Normal range of motion.  Skin:    General: Skin is warm and dry.  Neurological:  Mental Status: He is alert and oriented to person, place, and time.     ED Results / Procedures / Treatments   Labs (all labs ordered are listed, but only abnormal results are displayed) Labs Reviewed  URINALYSIS, ROUTINE W REFLEX MICROSCOPIC - Abnormal; Notable for the following components:      Result Value   Color, Urine RED (*)    APPearance TURBID (*)    Glucose, UA   (*)    Value: TEST NOT REPORTED DUE TO COLOR INTERFERENCE OF URINE PIGMENT   Hgb urine dipstick   (*)    Value: TEST NOT REPORTED DUE TO COLOR INTERFERENCE OF URINE PIGMENT   Bilirubin Urine   (*)    Value: TEST NOT REPORTED DUE TO COLOR INTERFERENCE OF URINE PIGMENT   Ketones, ur   (*)    Value: TEST NOT REPORTED DUE TO COLOR INTERFERENCE OF URINE PIGMENT   Protein, ur   (*)    Value: TEST NOT REPORTED DUE TO COLOR  INTERFERENCE OF URINE PIGMENT   Nitrite   (*)    Value: TEST NOT REPORTED DUE TO COLOR INTERFERENCE OF URINE PIGMENT   Leukocytes,Ua   (*)    Value: TEST NOT REPORTED DUE TO COLOR INTERFERENCE OF URINE PIGMENT   Bacteria, UA FEW (*)    All other components within normal limits  CBC WITH DIFFERENTIAL/PLATELET - Abnormal; Notable for the following components:   WBC 10.7 (*)    Neutro Abs 8.8 (*)    All other components within normal limits  COMPREHENSIVE METABOLIC PANEL - Abnormal; Notable for the following components:   Glucose, Bld 113 (*)    BUN 28 (*)    Creatinine, Ser 1.82 (*)    Albumin 3.4 (*)    Alkaline Phosphatase 174 (*)    GFR, Estimated 36 (*)    All other components within normal limits  URINE CULTURE    EKG None  Radiology CT ABDOMEN PELVIS W CONTRAST Result Date: 07/10/2023 CLINICAL DATA:  Abdominal and flank pain with stone suspected. Hematuria. Blood in urine for 2 days. EXAM: CT ABDOMEN AND PELVIS WITH CONTRAST TECHNIQUE: Multidetector CT imaging of the abdomen and pelvis was performed using the standard protocol following bolus administration of intravenous contrast. RADIATION DOSE REDUCTION: This exam was performed according to the departmental dose-optimization program which includes automated exposure control, adjustment of the mA and/or kV according to patient size and/or use of iterative reconstruction technique. CONTRAST:  60mL OMNIPAQUE IOHEXOL 350 MG/ML SOLN COMPARISON:  CT chest abdomen and pelvis 03/20/2023 FINDINGS: Lower chest: Motion artifact. Emphysematous changes and slight scarring in the lung bases. Hepatobiliary: No focal liver lesions. Large stone in the gallbladder. No gallbladder inflammatory changes. No bile duct dilatation. Pancreas: Unremarkable. No pancreatic ductal dilatation or surrounding inflammatory changes. Spleen: Normal in size without focal abnormality. Adrenals/Urinary Tract: No adrenal gland nodules. Kidneys are symmetrical. Bilateral  parenchymal and parapelvic cysts, largest measuring 2.2 cm diameter. No change since prior study. No imaging follow-up is indicated. No renal or ureteral stones. No hydronephrosis. Bladder wall is thickened, possibly representing cystitis. 9 mm rounded hyperdensity in the bladder abuts the dome with some associated bladder wall thickening. This likely represents transitional cell neoplasm or possibly adherent clot. Suggest cystoscopy for further evaluation. Stomach/Bowel: Stomach, small bowel, and colon are not abnormally distended. Scattered stool throughout the colon. Colonic diverticula without evidence of acute diverticulitis. Appendix is not identified. Vascular/Lymphatic: Aortic atherosclerosis. No enlarged abdominal or pelvic lymph nodes. Reproductive: Prostate gland  is enlarged. Other: No free air or free fluid in the abdomen. Abdominal wall musculature appears intact. Musculoskeletal: Old right rib fractures. Degenerative changes in the spine and hips. Compression fractures at L1 and L3. L3 compression is unchanged but L1 compression is new since prior study. This may represent an acute fracture. IMPRESSION: 1. Bladder wall thickening, possibly cystitis. 9 mm hyperdensity along the nondependent surface of the bladder with adjacent bladder wall thickening. This is suspicious for transitional cell neoplasm or possibly adherent clot. Suggest cystoscopy for further evaluation. 2. No renal or ureteral stone or obstruction. 3. Prostate gland is enlarged. 4. Cholelithiasis without evidence of acute cholecystitis. 5. Aortic atherosclerosis. 6. New L1 compression deformity may represent acute compression. Electronically Signed   By: Burman Nieves M.D.   On: 07/10/2023 20:35    Procedures Procedures    Medications Ordered in ED Medications  cephALEXin (KEFLEX) capsule 500 mg (has no administration in time range)  HYDROcodone-acetaminophen (NORCO/VICODIN) 5-325 MG per tablet 1 tablet (has no  administration in time range)  iohexol (OMNIPAQUE) 350 MG/ML injection 60 mL (60 mLs Intravenous Contrast Given 07/10/23 1947)    ED Course/ Medical Decision Making/ A&P                                 Medical Decision Making Amount and/or Complexity of Data Reviewed Labs: ordered. Radiology: ordered and independent interpretation performed. ECG/medicine tests: ordered and independent interpretation performed.  Risk Prescription drug management.   88 year old male here with painless hematuria x 2 days.  He is on Xarelto for A-fib.  No history of same.  He is afebrile and nontoxic in appearance.  His abdomen is soft and nontender.    Labs as above--no leukocytosis or electrolyte derangement.  His renal function appears at baseline given his known CKD.  CT with findings of bladder wall inflammation, questionable clot burden versus small neoplasm.  UA is pending.  CT also noted compression fracture of the lumbar spine.  Has known compression fractures from from prior fall. Has TLSO brace already but not currently wearing it.  UA with large hematuria, incomplete interpretation of UA due to interference.  Culture has been sent.  Given reports inflammatory changes on CT, will start abx pending urine culture.  Urology follow up given, may need cystoscopy.  Can return here for new concerns.  Final Clinical Impression(s) / ED Diagnoses Final diagnoses:  Hematuria, unspecified type    Rx / DC Orders ED Discharge Orders          Ordered    cephALEXin (KEFLEX) 500 MG capsule  2 times daily        07/11/23 0131              Garlon Hatchet, PA-C 07/11/23 2956    Gilda Crease, MD 07/11/23 [phone]

## 2023-07-11 NOTE — ED Notes (Signed)
Patient discharged home. VSS. Patient wheeled out of treatment area. All questions answered at time of discharge. Belongings sent home with patient.

## 2023-07-11 NOTE — Discharge Instructions (Addendum)
Take the prescribed medication as directed.  If urine culture shows abnormal bacteria, you will be contacted. Follow-up with urology-- call Monday for appt. Return to the ED for new or worsening symptoms.

## 2023-07-12 LAB — URINE CULTURE

## 2023-07-20 ENCOUNTER — Other Ambulatory Visit: Payer: Self-pay | Admitting: Urology

## 2023-07-20 ENCOUNTER — Telehealth: Payer: Self-pay | Admitting: Cardiology

## 2023-07-20 NOTE — Telephone Encounter (Signed)
 Patient with diagnosis of afib on Xarelto  for anticoagulation.    Procedure: transurethral resection of bladder tumor  Date of procedure: 07/28/23   CHA2DS2-VASc Score = 3   This indicates a 3.2% annual risk of stroke. The patient's score is based upon: CHF History: 0 HTN History: 1 Diabetes History: 0 Stroke History: 0 Vascular Disease History: 0 Age Score: 2 Gender Score: 0      CrCl 28 Platelet count 229  Per office protocol, patient can hold Xarelto  for 3 days prior to procedure.    **This guidance is not considered finalized until pre-operative APP has relayed final recommendations.**

## 2023-07-20 NOTE — Telephone Encounter (Signed)
   Pre-operative Risk Assessment    Patient Name: WINDOM BALSER  DOB: 08/28/1935 MRN: [id number]   Date of last office visit: 04/28/22 Date of next office visit: 09/15/23   Request for Surgical Clearance    Procedure:  transurethral resection of bladder tumor   Date of Surgery:  Clearance 09/25/23                                Surgeon:  Dr. Marshell Skelton Group or Practice Name:  [facility]  Phone number:  [phone] Fax number:  [phone]   Type of Clearance Requested:   - Pharmacy:  Hold Rivaroxaban  (Xarelto ) hold 3 days prior to surgery   Type of Anesthesia:  General    Additional requests/questions:      Ulysses Ganja   07/20/2023, 2:05 PM

## 2023-07-20 NOTE — Telephone Encounter (Signed)
 Primary Cardiologist:None  Chart reviewed as part of pre-operative protocol coverage. Because of Richard Stanley's past medical history and time since last visit, he/she will require a follow-up visit in order to better assess preoperative cardiovascular risk.  Pre-op covering staff: - Please schedule appointment and call patient to inform them. - Please contact requesting surgeon's office via preferred method (i.e, phone, fax) to inform them of need for appointment prior to surgery.  If applicable, this message will also be routed to pharmacy pool and/or primary cardiologist for input on holding anticoagulant/antiplatelet agent as requested below so that this information is available at time of patient's appointment.   Gerldine Koch, NP-C  07/20/2023, 4:12 PM [address] Office [phone] Fax [phone]

## 2023-07-20 NOTE — Telephone Encounter (Signed)
 I will confirm with Dr. Melrose Squire office date of surgery before I call the pt and schedule appt in office.

## 2023-07-21 ENCOUNTER — Ambulatory Visit: Payer: Medicare HMO | Attending: Nurse Practitioner | Admitting: Nurse Practitioner

## 2023-07-21 ENCOUNTER — Encounter: Payer: Self-pay | Admitting: Nurse Practitioner

## 2023-07-21 VITALS — BP 128/72 | HR 89 | Ht 67.0 in | Wt 151.0 lb

## 2023-07-21 DIAGNOSIS — Z01810 Encounter for preprocedural cardiovascular examination: Secondary | ICD-10-CM

## 2023-07-21 DIAGNOSIS — I480 Paroxysmal atrial fibrillation: Secondary | ICD-10-CM | POA: Diagnosis not present

## 2023-07-21 DIAGNOSIS — I10 Essential (primary) hypertension: Secondary | ICD-10-CM | POA: Diagnosis not present

## 2023-07-21 DIAGNOSIS — N1832 Chronic kidney disease, stage 3b: Secondary | ICD-10-CM

## 2023-07-21 NOTE — Patient Instructions (Signed)
 SURGICAL WAITING ROOM VISITATION Patients having surgery or a procedure may have no more than 2 support people in the waiting area - these visitors may rotate.    Children under the age of 4 must have an adult with them who is not the patient.  Due to an increase in RSV and influenza rates and associated hospitalizations, children ages 23 and under may not visit patients in [facility] hospitals.   If the patient needs to stay at the hospital during part of their recovery, the visitor guidelines for inpatient rooms apply. Pre-op nurse will coordinate an appropriate time for 1 support person to accompany patient in pre-op.  This support person may not rotate.    Please refer to the [facility] website for the visitor guidelines for Inpatients (after your surgery is over and you are in a regular room).       Your procedure is scheduled on:  07-28-23   Report to [facility] Main Entrance    Report to admitting at 8:45 AM   Call this number if you have problems the morning of surgery [phone]   Do not eat food or drink liquids :After Midnight.           If you have questions, please contact your surgeon's office.   FOLLOW  ANY ADDITIONAL PRE OP INSTRUCTIONS YOU RECEIVED FROM YOUR SURGEON'S OFFICE!!!     Oral Hygiene is also important to reduce your risk of infection.                                    Remember - BRUSH YOUR TEETH THE MORNING OF SURGERY WITH YOUR REGULAR TOOTHPASTE   Do NOT smoke after Midnight   Take these medicines the morning of surgery with A SIP OF WATER:    Amiodarone   Fluoxetine  Stop all vitamins and herbal supplements 7 days before surgery  Bring CPAP mask and tubing day of surgery.                              You may not have any metal on your body including  jewelry, and body piercing             Do not wear lotions, powders, cologne, or deodorant              Men may shave face and neck.   Do not bring valuables to the hospital.  [facility] IS NOT RESPONSIBLE   FOR VALUABLES.   Contacts, dentures or bridgework may not be worn into surgery.  DO NOT BRING YOUR HOME MEDICATIONS TO THE HOSPITAL. PHARMACY WILL DISPENSE MEDICATIONS LISTED ON YOUR MEDICATION LIST TO YOU DURING YOUR ADMISSION IN THE HOSPITAL!    Patients discharged on the day of surgery will not be allowed to drive home.  Someone NEEDS to stay with you for the first 24 hours after anesthesia.   Special Instructions: Bring a copy of your healthcare power of attorney and living will documents the day of surgery if you haven't scanned them before.              Please read over the following fact sheets you were given: IF YOU HAVE QUESTIONS ABOUT YOUR PRE-OP INSTRUCTIONS PLEASE CALL [phone] [place]  If you received a COVID test during your pre-op visit  it is requested that you wear a  mask when out in public, stay away from anyone that may not be feeling well and notify your surgeon if you develop symptoms. If you test positive for Covid or have been in contact with anyone that has tested positive in the last 10 days please notify you surgeon.  [facility] - Preparing for Surgery Before surgery, you can play an important role.  Because skin is not sterile, your skin needs to be as free of germs as possible.  You can reduce the number of germs on your skin by washing with CHG (chlorahexidine gluconate) soap before surgery.  CHG is an antiseptic cleaner which kills germs and bonds with the skin to continue killing germs even after washing. Please DO NOT use if you have an allergy to CHG or antibacterial soaps.  If your skin becomes reddened/irritated stop using the CHG and inform your nurse when you arrive at Short Stay. Do not shave (including legs and underarms) for at least 48 hours prior to the first CHG shower.  You may shave your face/neck.  Please follow these instructions carefully:  1.  Shower with CHG Soap the night before surgery and the  morning of  surgery.  2.  If you choose to wash your hair, wash your hair first as usual with your normal  shampoo.  3.  After you shampoo, rinse your hair and body thoroughly to remove the shampoo.                             4.  Use CHG as you would any other liquid soap.  You can apply chg directly to the skin and wash.  Gently with a scrungie or clean washcloth.  5.  Apply the CHG Soap to your body ONLY FROM THE NECK DOWN.   Do   not use on face/ open                           Wound or open sores. Avoid contact with eyes, ears mouth and   genitals (private parts).                       Wash face,  Genitals (private parts) with your normal soap.             6.  Wash thoroughly, paying special attention to the area where your    surgery  will be performed.  7.  Thoroughly rinse your body with warm water from the neck down.  8.  DO NOT shower/wash with your normal soap after using and rinsing off the CHG Soap.                9.  Pat yourself dry with a clean towel.            10.  Wear clean pajamas.            11.  Place clean sheets on your bed the night of your first shower and do not  sleep with pets. Day of Surgery : Do not apply any lotions/deodorants the morning of surgery.  Please wear clean clothes to the hospital/surgery center.  FAILURE TO FOLLOW THESE INSTRUCTIONS MAY RESULT IN THE CANCELLATION OF YOUR SURGERY  PATIENT SIGNATURE_________________________________  NURSE SIGNATURE__________________________________  ________________________________________________________________________

## 2023-07-21 NOTE — Telephone Encounter (Signed)
Left message for surgery scheduler to call back and confirm date for procedure.

## 2023-07-21 NOTE — Progress Notes (Addendum)
 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 male with a PMH of paroxysmal AF (on Xarelto), thyroid nodule, COPD, HTN, HLD, lung nodule, depression, asthma, CKD III, HOH who presents today for preoperative clearance.  Richard Stanley was seen initially at the ED in 2019 for new onset AF with RVR. During visit patient converted spontaneously and completed 2D echo that showed normal EF with mild RAE apixaban 2.5 mg twice daily along with Cardizem 120 mg daily.  He was found to have orthostasis at follow-up and was started on midodrine.  Tablet care with PT in 02/2020 was lost to follow-up until 2023.  He was admitted on 08/12/2021 with AF with RVR and was rate controlled with Cardizem 240 mg and metoprolol 100 mg daily.  2D echo was also completed showing EF of 50 as 55% with global hypokinesis and mildly reduced systolic function he was started on Xarelto as well for stroke prophylaxis.  He underwent DCCV on 11/12/2021 and maintained sinus rhythm.  He was last seen on 04/28/2022 by Dr. Jacinto Halim and was maintaining sinus rhythm.  He was noted to have sinus bradycardia remained asymptomatic.  His  blood pressure was good control patient was advised to follow-up in 6 months.  Richard Stanley presents today for preoperative clearance with his wife.  He reports since his previous follow-up that he is not experienced any new cardiac complaints. However, he has been experiencing frequent headaches and dizziness, which have led to multiple falls. The patient's spouse reports that the patient has been peeing blood since resuming Xarelto after a brief pause for a previous procedure.  They are interested in possibly discontinuing blood thinner in the future.  The patient also mentions a history of a compression fracture and rib injuries from previous falls. Despite these issues, the patient has not seen a neurologist or an ear specialist for his symptoms.  I advised him to follow-up with his primary care doctor regarding possible referral to specialist.   Patient denies chest pain, palpitations, dyspnea, PND, orthopnea, nausea, vomiting, dizziness, syncope, edema, weight gain, or early satiety.  Review of Systems  Please see the history of present illness.    All other systems reviewed and are otherwise negative except as noted above.  Physical Exam    Wt Readings from Last 3 Encounters:  07/10/23 149 lb 14.6 oz (68 kg)  03/20/23 150 lb (68 kg)  01/10/23 160 lb (72.6 kg)   GN:FAOZH were no vitals filed for this visit.,There is no height or weight on file to calculate BMI. GEN: Well nourished, well developed in no acute distress Neck: No JVD; No carotid bruits  Pulmonary: Clear to auscultation without rales, wheezing or rhonchi  Cardiovascular: Normal rate. Regular rhythm. Normal S1. Normal S2.   Murmurs: There is no murmur.  ABDOMEN: Soft, non-tender, non-distended EXTREMITIES:  No edema; No deformity   EKG/LABS/ Recent Cardiac Studies   ECG personally reviewed by me today -sinus rhythm with rate of 90 bpm with right axis deviation and no acute changes consistent with previous EKG.  Risk  Assessment/Calculations:    CHA2DS2-VASc Score = 3   This indicates a 3.2% annual risk of stroke. The patient's score is based upon: CHF History: 0 HTN History: 1 Diabetes History: 0 Stroke History: 0 Vascular Disease History: 0 Age Score: 2 Gender Score: 0         Lab Results  Component Value Date   WBC 10.7 (H) 07/10/2023   HGB 15.1 07/10/2023   HCT 47.1 07/10/2023   MCV 92.9 07/10/2023   PLT 229 07/10/2023   Lab Results  Component Value Date   CREATININE 1.82 (H) 07/10/2023   BUN 28 (H) 07/10/2023   NA 138 07/10/2023   K 4.4 07/10/2023   CL 100 07/10/2023   CO2 26 07/10/2023   Lab Results  Component Value Date   CHOL 159 08/17/2017   HDL 40 (L) 08/17/2017   LDLCALC 77 08/17/2017   LDLDIRECT 159.8 06/18/2009   TRIG 209 (H) 08/17/2017   CHOLHDL 4.0 08/17/2017    Lab Results  Component Value Date   HGBA1C 5.4 08/17/2017   Assessment & Plan    1.  Preoperative clearance: -Patient's RCRI score is 0.9%  -The patient affirms he has been doing well without any new cardiac symptoms. They are able to achieve 4 METS without cardiac limitations. Therefore, based on ACC/AHA guidelines, the patient would be at acceptable risk for the planned procedure without further cardiovascular testing. The patient was advised that if he develops new symptoms prior to surgery to contact our office to arrange for a follow-up visit, and he verbalized understanding.   -Per office protocol, patient can hold Xarelto for 3 days prior to procedure.    2.  Paroxysmal AF: -s/p DCCV 2023 and currently on rate control with amiodarone 100 mg  -Patient's creatinine clearance is 26 mL/min -Continue Xarelto 15 mg daily -Patient reports hematuria with Xarelto and will wear 30-day event monitor to evaluate for AF burden -Patient will also be considered for referral of watchman if AF burden is elevated on event monitor.  3.  Primary hypertension: -Patient's blood pressure today was controlled at  128/72  4.  CKD stage IIIb: -Patient's last creatinine was 1.5 -Continue current treatment plan per PCP  5. Frequent Falls and Dizziness Patient reports frequent falls, dizziness, and headaches. No known neurological evaluation to date. Possible vertigo suggested. -Recommend primary care provider referral to neurology for evaluation of frequent falls and dizziness. -Encourage patient to increase hydration to potentially alleviate dizziness.  Disposition: Follow-up with None or APP in 2 months    Signed, Napoleon Form, Leodis Rains, NP 07/21/2023, 12:14 PM [facility]

## 2023-07-21 NOTE — Patient Instructions (Addendum)
Medication Instructions:  Your physician recommends that you continue on your current medications as directed. Please refer to the Current Medication list given to you today.  *If you need a refill on your cardiac medications before your next appointment, please call your pharmacy*  Lab Work: If you have labs (blood work) drawn today and your tests are completely normal, you will receive your results only by: MyChart Message (if you have MyChart) OR A paper copy in the mail If you have any lab test that is abnormal or we need to change your treatment, we will call you to review the results.  Testing/Procedures: Your physician has recommended that you wear an event monitor. Event monitors are medical devices that record the heart's electrical activity. Doctors most often [place] these monitors to diagnose arrhythmias. Arrhythmias are problems with the speed or rhythm of the heartbeat. The monitor is a small, portable device. You can wear one while you do your normal daily activities. This is usually used to diagnose what is causing palpitations/syncope (passing out).  Follow-Up: At [facility], you and your health needs are our priority.  As part of our continuing mission to provide you with exceptional heart care, we have created designated Provider Care Teams.  These Care Teams include your primary Cardiologist (physician) and Advanced Practice Providers (APPs -  Physician Assistants and Nurse Practitioners) who all work together to provide you with the care you need, when you need it.  We recommend signing up for the patient portal called "MyChart".  Sign up information is provided on this After Visit Summary.  MyChart is used to connect with patients for Virtual Visits (Telemedicine).  Patients are able to view lab/test results, encounter notes, upcoming appointments, etc.  Non-urgent messages can be sent to your provider as well.   To learn more about what you can do with MyChart, go to  [email or web address].    Your next appointment:   6 month(s)  Provider:   Dr. Jacinto Halim  Other Instructions  Please call your primary care doctor to get a referral to neurology.     1st Floor: - Lobby - Registration  - Pharmacy  - Lab - Cafe  2nd Floor: - PV Lab - Diagnostic Testing (echo, CT, nuclear med)  3rd Floor: - Vacant  4th Floor: - TCTS (cardiothoracic surgery) - AFib Clinic - Structural Heart Clinic - Vascular Surgery  - Vascular Ultrasound  5th Floor: - HeartCare Cardiology (general and EP) - Clinical Pharmacy for coumadin, hypertension, lipid, weight-loss medications, and med management appointments    Valet parking services will be available as well.

## 2023-07-21 NOTE — Progress Notes (Signed)
COVID Vaccine Completed:  Date of COVID positive in last 90 days:  PCP - Elder Negus, NP Cardiologist - Yates Decamp, MD  Chest x-ray - 06-11-23 Epic EKG - 06-11-23 Epic Stress Test -  ECHO - 08-13-21 [facility] Cardiac Cath - 1998 Pacemaker/ICD device last checked: Spinal Cord Stimulator: Telemetry monitor - 09-03-17 Epic  Bowel Prep -   Sleep Study -  CPAP -   Fasting Blood Sugar -  Checks Blood Sugar _____ times a day  Last dose of GLP1 agonist-  N/A GLP1 instructions:  Hold 7 days before surgery    Last dose of SGLT-2 inhibitors-  N/A SGLT-2 instructions:  Hold 3 days before surgery    Blood Thinner Instructions:  Xarelto Last dose:   Time: Aspirin Instructions: Last Dose:  Activity level:  Can go up a flight of stairs and perform activities of daily living without stopping and without symptoms of chest pain or shortness of breath.  Able to exercise without symptoms  Unable to go up a flight of stairs without symptoms of     Anesthesia review:  Afib, HTN, COPD, CKD  Patient denies shortness of breath, fever, cough and chest pain at PAT appointment  Patient verbalized understanding of instructions that were given to them at the PAT appointment. Patient was also instructed that they will need to review over the PAT instructions again at home before surgery.

## 2023-07-21 NOTE — Telephone Encounter (Signed)
Richard Stanley, from Dr. Peggye Fothergill office called back and left vm correct surgery date is 07/28/23.    Pt will need a sooner appt in office for preop clearance.    I called the pt and s/w pt and his wife and the pt has been scheduled to see Robin Searing, NP 07/21/23 @ 3:35. I will update all parties involved.

## 2023-07-22 ENCOUNTER — Encounter (HOSPITAL_COMMUNITY): Payer: Self-pay

## 2023-07-22 ENCOUNTER — Other Ambulatory Visit: Payer: Self-pay

## 2023-07-22 ENCOUNTER — Encounter (HOSPITAL_COMMUNITY)
Admission: RE | Admit: 2023-07-22 | Discharge: 2023-07-22 | Disposition: A | Discharge status: Home / Self Care | Payer: Medicare HMO | Source: Ambulatory Visit | Attending: Urology | Admitting: Urology

## 2023-07-22 ENCOUNTER — Other Ambulatory Visit: Payer: Self-pay | Admitting: Nurse Practitioner

## 2023-07-22 ENCOUNTER — Encounter: Payer: Self-pay | Admitting: *Deleted

## 2023-07-22 DIAGNOSIS — J4489 Other specified chronic obstructive pulmonary disease: Secondary | ICD-10-CM | POA: Diagnosis not present

## 2023-07-22 DIAGNOSIS — N1832 Chronic kidney disease, stage 3b: Secondary | ICD-10-CM

## 2023-07-22 DIAGNOSIS — Z87891 Personal history of nicotine dependence: Secondary | ICD-10-CM | POA: Insufficient documentation

## 2023-07-22 DIAGNOSIS — I129 Hypertensive chronic kidney disease with stage 1 through stage 4 chronic kidney disease, or unspecified chronic kidney disease: Secondary | ICD-10-CM | POA: Diagnosis not present

## 2023-07-22 DIAGNOSIS — I480 Paroxysmal atrial fibrillation: Secondary | ICD-10-CM

## 2023-07-22 DIAGNOSIS — Z01810 Encounter for preprocedural cardiovascular examination: Secondary | ICD-10-CM

## 2023-07-22 DIAGNOSIS — I4891 Unspecified atrial fibrillation: Secondary | ICD-10-CM | POA: Insufficient documentation

## 2023-07-22 DIAGNOSIS — I10 Essential (primary) hypertension: Secondary | ICD-10-CM

## 2023-07-22 DIAGNOSIS — D494 Neoplasm of unspecified behavior of bladder: Secondary | ICD-10-CM | POA: Diagnosis not present

## 2023-07-22 DIAGNOSIS — N183 Chronic kidney disease, stage 3 unspecified: Secondary | ICD-10-CM | POA: Diagnosis not present

## 2023-07-22 DIAGNOSIS — Z01812 Encounter for preprocedural laboratory examination: Secondary | ICD-10-CM | POA: Insufficient documentation

## 2023-07-22 DIAGNOSIS — R42 Dizziness and giddiness: Secondary | ICD-10-CM

## 2023-07-22 HISTORY — DX: Unspecified dementia, unspecified severity, without behavioral disturbance, psychotic disturbance, mood disturbance, and anxiety: F03.90

## 2023-07-22 HISTORY — DX: Headache, unspecified: R51.9

## 2023-07-22 NOTE — Telephone Encounter (Signed)
Patient's wife is following up. She states patient has been passing blood clots and she is concerned with him still being on Xarelto. She would like to know if he can start holding blood thinner prior to 2/15. Wife states she has been encouraging patient to drink more water for now. Please advise.

## 2023-07-22 NOTE — Progress Notes (Unsigned)
Patient enrolled for [employer] to ship a 30 day cardiac event monitor to his address on file. Letter with instructions mailed to patient. Dr. Jacinto Halim to read.

## 2023-07-22 NOTE — Progress Notes (Signed)
Anesthesia Chart Review   Case: [id number] Date/Time: 07/28/23 1045   Procedures:      TRANSURETHRAL RESECTION OF BLADDER TUMOR (TURBT) with GEMCITABINE AND RETROGRADE PYELOGRAM - 60 MINUTE CASE     POSSIBLE URETEROSCOPY (Bilateral)   Anesthesia type: General   Pre-op diagnosis: BLADDER TUMOR   Location: [facility] ROOM 03 / [facility]   Surgeons: Adonis Brook, MD       DISCUSSION:87 y.o. former smoker with h/o COPD, HTN, atrial fibrillation, CKD Stage III, bladder tumor scheduled for above procedure 07/28/2023 with Dr. Vilma Prader.   Pt seen by cardiology 07/21/2023. Per OV note, "The patient affirms he has been doing well without any new cardiac symptoms. They are able to achieve 4 METS without cardiac limitations. Therefore, based on ACC/AHA guidelines, the patient would be at acceptable risk for the planned procedure without further cardiovascular testing. The patient was advised that if he develops new symptoms prior to surgery to contact our office to arrange for a follow-up visit, and he verbalized understanding.    -Per office protocol, patient can hold Xarelto for 3 days prior to procedure."  VS: BP (!) 127/108   Pulse 95   Resp 20   Ht 5\' 6"  (1.676 m)   Wt 68 kg   SpO2 98%   BMI 24.20 kg/m   PROVIDERS: Elder Negus, NP is PCP    LABS: Labs reviewed: Acceptable for surgery. (all labs ordered are listed, but only abnormal results are displayed)  Labs Reviewed - No data to display   IMAGES:   EKG:   CV: Echocardiogram 03/07/2020: Normal LV systolic function with visual EF 55-60%. Left ventricle cavity is normal in size. Normal global wall motion. Normal diastolic filling pattern, normal LAP. Mild (Grade I) aortic regurgitation. Compared to prior study dated 08/17/2017 no significant changes except, AR is new.   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   Headache    HYPERLIPIDEMIA 06/18/2009   Nodule of right lung    Other diseases of lung, not elsewhere classified 06/18/2009   Rash 10/01/2010   Thyroid nodule 10/01/2010    Past Surgical History:  Procedure Laterality Date   CARDIAC CATHETERIZATION  1998   normal per pt   CARDIOVERSION N/A 11/12/2021   Procedure: CARDIOVERSION;  Surgeon: Yates Decamp, MD;  Location: [facility] ENDOSCOPY;  Service: Cardiovascular;  Laterality: N/A;    MEDICATIONS:  amiodarone (PACERONE) 100 MG tablet   atorvastatin (LIPITOR) 20 MG tablet   FLUoxetine (PROZAC) 20 MG capsule   Rivaroxaban (XARELTO) 15 MG TABS tablet   No current facility-administered medications for this encounter.     Jodell Cipro Ward, PA-C [facility] Pre-Surgical Testing [phone]

## 2023-07-23 NOTE — Telephone Encounter (Signed)
Spoke with patient wife and aware Per Verdie Drown, recommendations to hold Xarelto only 3 days prior to procedure to prevent a stroke risk. Patient wife agreed and thanked for call back.

## 2023-07-26 NOTE — H&P (Signed)
 88 year old male referred for microscopic hematuria. UA shows moderate blood in the urine. Patient also has dysuria.   PMH: Currently on Xarelto, hypertension, generalized anxiety disorder, COPD, CKD, atrial fibrillation  PSH: cardioversion, cardiac catheterizatoin   Microscopic hematuria:  07/17/2023: patient has GH, patient is on xarelto, patient having GH currently. 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 Patient is not losing wt and. Pt has no pain.Cysto today shows Nodular tumor on R side of bladder wall.  PVR 20     ALLERGIES: No Known Allergies    MEDICATIONS: Amiodarone Hcl  Atorvastatin Calcium  Fluoxetine Dr  Phillips Climes     GU PSH: No GU PSH    NON-GU PSH: No Non-GU PSH    GU PMH: No GU PMH    NON-GU PMH: No Non-GU PMH    FAMILY HISTORY: No Family History    SOCIAL HISTORY: No Social History    REVIEW OF SYSTEMS:    GU Review Male:   Patient denies frequent urination, hard to postpone urination, burning/ pain with urination, get up at night to urinate, leakage of urine, stream starts and stops, trouble starting your stream, have to strain to urinate , erection problems, and penile pain.  Gastrointestinal (Upper):   Patient denies nausea, vomiting, and indigestion/ heartburn.  Gastrointestinal (Lower):   Patient denies diarrhea and constipation.  Constitutional:   Patient denies fever, night sweats, weight loss, and fatigue.  Skin:   Patient denies skin rash/ lesion and itching.  Eyes:   Patient denies blurred vision and double vision.  Ears/ Nose/ Throat:   Patient denies sore throat and sinus problems.  Hematologic/Lymphatic:   Patient denies swollen glands and easy bruising.  Cardiovascular:   Patient denies leg swelling and chest pains.  Respiratory:   Patient denies cough and shortness of breath.  Endocrine:   Patient denies excessive thirst.  Musculoskeletal:   Patient denies back pain and joint pain.   Neurological:   Patient denies headaches and dizziness.  Psychologic:   Patient denies depression and anxiety.   VITAL SIGNS:      07/17/2023 10:04 AM  BP 140/106 mmHg  Pulse 81 /min  Temperature 97.5 F / 36.3 C   MULTI-SYSTEM PHYSICAL EXAMINATION:    Constitutional: Uses walker for ambulation, fatigue during visit.  Respiratory: No labored breathing, no use of accessory muscles.   Cardiovascular: Normal temperature, normal extremity pulses, no swelling, no varicosities.  Neurologic / Psychiatric: Oriented to time, oriented to place, oriented to person. No depression, no anxiety, no agitation.     Complexity of Data:  Records Review:   Previous Patient Records  Urine Test Review:   Urinalysis   PROCEDURES:         Flexible Cystoscopy - 52000  Risks, benefits, and some of the potential complications of the procedure were discussed at length with the patient including infection, bleeding, voiding discomfort, urinary retention, fever, chills, sepsis, and others. All questions were answered. Informed consent was obtained. Antibiotic prophylaxis was given. Sterile technique and intraurethral analgesia were used.  Meatus:  Normal size. Normal location. Normal condition.  Urethra:  No strictures.  External Sphincter:  Normal.  Verumontanum:  Normal.  Prostate:  Non-obstructing. No hyperplasia.  Bladder Neck:  Non-obstructing.  Ureteral Orifices:  Normal location. Normal size. Normal shape. Effluxed clear urine.  Bladder:  Significant amount of blood in the bladder, difficult visualization, small diverticuli on the right posterior bladder wall. In the  more proximal aspect of the right lateral wall there is a 2 cm nodular tumor not involving the right ureteral orifice. Unable to completely visualize the whole bladder due to gross hematuria.      The lower urinary tract was carefully examined. The procedure was well-tolerated and without complications. Antibiotic instructions were given.  Instructions were given to call the office immediately for bloody urine, difficulty urinating, urinary retention, painful or frequent urination, fever, chills, nausea, vomiting or other illness. The patient stated that he understood these instructions and would comply with them.        PVR Ultrasound - 16109  Scanned Volume: 20 cc         Visit Complexity - G2211          Urinalysis w/Scope Dipstick Dipstick Cont'd Micro  Color: Red Bilirubin: Invalid mg/dL WBC/hpf: 6 - 60/AVW  Appearance: Cloudy Ketones: Invalid mg/dL RBC/hpf: >09/WJX  Specific Gravity: Invalid Blood: Invalid ery/uL Bacteria: Few (10-25/hpf)  pH: Invalid Protein: Invalid mg/dL Cystals: NS (Not Seen)  Glucose: Invalid mg/dL Urobilinogen: Invalid mg/dL Casts: NS (Not Seen)    Nitrites: Invalid Trichomonas: Not Present    Leukocyte Esterase: Invalid leu/uL Mucous: Not Present      Epithelial Cells: 0 - 5/hpf      Yeast: NS (Not Seen)      Sperm: Not Present    Notes: Micro performed on unspun urine due to QNS Tests invalid due to color interference    ASSESSMENT:      ICD-10 Details  1 GU:   Microscopic hematuria - R31.21   2   Dysuria - R30.0    PLAN:           Orders Labs CBC with Diff, Urine Culture, BMP          Document Letter(s):  Created for Patient: Clinical Summary   Created for Chart   Created for Chart: Medical Necessity Letter   Created for Chart         Notes:   Bladder tumor: Bladder tumor seen on cystoscopy will plan for TURBT. Patient does have significant cardiac history including A-fib CVA and COPD currently on Xarelto patient's cardiologist is Dr. Gwynneth Macleod with [facility] we will request clearance from him.  Plan for surgery today for TURBT and RGP.   We discussed risk benefits alternatives to transurethral resection of bladder tumor. Benefits are diagnostic and therapeutic which include removal of bladder tumor and relieve the possible irritative symptoms from bladder tumor. We discussed risk  including not being able to remove all of the tumor, possibility of perforation of the bladder requiring long-term catheter or operative intervention to repair. The need for catheter postoperatively was also discussed, as well as postoperative lower urinary tract symptoms in the immediate postop period. Patient voiced their understanding and would like to proceed with the surgery.

## 2023-07-27 ENCOUNTER — Telehealth: Payer: Self-pay | Admitting: Cardiology

## 2023-07-27 NOTE — Telephone Encounter (Signed)
Left a message for the pt/ wife to call back.  

## 2023-07-27 NOTE — Telephone Encounter (Signed)
 Patient's wife is returning call. Transferred to Dewayne Hatch, Charity fundraiser.

## 2023-07-27 NOTE — Telephone Encounter (Signed)
Pt's wife returning nurses call. Please advise.  

## 2023-07-27 NOTE — Telephone Encounter (Signed)
 Pt c/o medication issue:  1. Name of Medication: Rivaroxaban (XARELTO) 15 MG TABS tablet   2. How are you currently taking this medication (dosage and times per day)?   Take 1 tablet (15 mg total) by mouth daily with supper.Patient taking differently: Take 15 mg by mouth daily.     3. Are you having a reaction (difficulty breathing--STAT)? No  4. What is your medication issue? Patient's wife is calling because the patient is holding his medication 3 day prior to surgery. Patient will have his surgery tomorrow morning. Patient's wife would like to know if the patient can resume the medication tomorrow after surgery. Please advise.

## 2023-07-27 NOTE — Telephone Encounter (Signed)
 Pts wife advised that the surgeon will let them know post d/c when it is safe fro him to restart his Xarelto. Cardio of course would like him back on it ASAP but only when safe per his Careers adviser.

## 2023-07-28 ENCOUNTER — Encounter (HOSPITAL_COMMUNITY): Payer: Self-pay | Admitting: Urology

## 2023-07-28 ENCOUNTER — Ambulatory Visit (HOSPITAL_COMMUNITY): Payer: Medicare HMO | Admitting: Physician Assistant

## 2023-07-28 ENCOUNTER — Encounter (HOSPITAL_COMMUNITY)
Admission: RE | Disposition: A | Discharge status: Home / Self Care | Payer: Self-pay | Source: Home / Self Care | Attending: Urology

## 2023-07-28 ENCOUNTER — Ambulatory Visit (HOSPITAL_COMMUNITY)
Admission: RE | Admit: 2023-07-28 | Discharge: 2023-07-28 | Disposition: A | Discharge status: Home / Self Care | Payer: Medicare HMO | Attending: Urology | Admitting: Urology

## 2023-07-28 ENCOUNTER — Ambulatory Visit (HOSPITAL_COMMUNITY): Payer: Medicare HMO

## 2023-07-28 ENCOUNTER — Ambulatory Visit (HOSPITAL_BASED_OUTPATIENT_CLINIC_OR_DEPARTMENT_OTHER): Payer: Medicare HMO | Admitting: Anesthesiology

## 2023-07-28 DIAGNOSIS — Z87891 Personal history of nicotine dependence: Secondary | ICD-10-CM

## 2023-07-28 DIAGNOSIS — I129 Hypertensive chronic kidney disease with stage 1 through stage 4 chronic kidney disease, or unspecified chronic kidney disease: Secondary | ICD-10-CM | POA: Insufficient documentation

## 2023-07-28 DIAGNOSIS — Z7901 Long term (current) use of anticoagulants: Secondary | ICD-10-CM | POA: Insufficient documentation

## 2023-07-28 DIAGNOSIS — I4891 Unspecified atrial fibrillation: Secondary | ICD-10-CM | POA: Insufficient documentation

## 2023-07-28 DIAGNOSIS — R310 Gross hematuria: Secondary | ICD-10-CM | POA: Diagnosis not present

## 2023-07-28 DIAGNOSIS — J449 Chronic obstructive pulmonary disease, unspecified: Secondary | ICD-10-CM

## 2023-07-28 DIAGNOSIS — Z9981 Dependence on supplemental oxygen: Secondary | ICD-10-CM | POA: Insufficient documentation

## 2023-07-28 DIAGNOSIS — N183 Chronic kidney disease, stage 3 unspecified: Secondary | ICD-10-CM | POA: Diagnosis not present

## 2023-07-28 DIAGNOSIS — C679 Malignant neoplasm of bladder, unspecified: Secondary | ICD-10-CM | POA: Diagnosis not present

## 2023-07-28 DIAGNOSIS — N35919 Unspecified urethral stricture, male, unspecified site: Secondary | ICD-10-CM | POA: Insufficient documentation

## 2023-07-28 DIAGNOSIS — I10 Essential (primary) hypertension: Secondary | ICD-10-CM

## 2023-07-28 DIAGNOSIS — C674 Malignant neoplasm of posterior wall of bladder: Secondary | ICD-10-CM | POA: Insufficient documentation

## 2023-07-28 DIAGNOSIS — J4489 Other specified chronic obstructive pulmonary disease: Secondary | ICD-10-CM | POA: Diagnosis not present

## 2023-07-28 SURGERY — TURBT, WITH CHEMOTHERAPEUTIC AGENT INSTILLATION INTO BLADDER
Anesthesia: General | Site: Bladder

## 2023-07-28 MED ORDER — IOHEXOL 300 MG/ML  SOLN
INTRAMUSCULAR | Status: DC | PRN
  Administered 2023-07-28: 8 mL

## 2023-07-28 MED ORDER — ONDANSETRON HCL 4 MG/2ML IJ SOLN
INTRAMUSCULAR | Status: DC | PRN
  Administered 2023-07-28: 4 mg via INTRAVENOUS

## 2023-07-28 MED ORDER — DEXAMETHASONE SODIUM PHOSPHATE 10 MG/ML IJ SOLN
INTRAMUSCULAR | Status: AC
  Filled 2023-07-28: qty 1

## 2023-07-28 MED ORDER — LIDOCAINE HCL (PF) 2 % IJ SOLN
INTRAMUSCULAR | Status: AC
  Filled 2023-07-28: qty 5

## 2023-07-28 MED ORDER — PHENYLEPHRINE 80 MCG/ML (10ML) SYRINGE FOR IV PUSH (FOR BLOOD PRESSURE SUPPORT)
PREFILLED_SYRINGE | INTRAVENOUS | Status: DC | PRN
  Administered 2023-07-28: 80 ug via INTRAVENOUS
  Administered 2023-07-28: 160 ug via INTRAVENOUS

## 2023-07-28 MED ORDER — ONDANSETRON HCL 4 MG/2ML IJ SOLN
INTRAMUSCULAR | Status: AC
  Filled 2023-07-28: qty 2

## 2023-07-28 MED ORDER — ROCURONIUM BROMIDE 10 MG/ML (PF) SYRINGE
PREFILLED_SYRINGE | INTRAVENOUS | Status: AC
  Filled 2023-07-28: qty 10

## 2023-07-28 MED ORDER — CHLORHEXIDINE GLUCONATE 0.12 % MT SOLN
15.0000 mL | Freq: Once | OROMUCOSAL | Status: AC
  Administered 2023-07-28: 15 mL via OROMUCOSAL

## 2023-07-28 MED ORDER — METHOCARBAMOL 750 MG PO TABS: 750.0000 mg | ORAL_TABLET | Freq: Four times a day (QID) | ORAL | 0 refills | Status: AC

## 2023-07-28 MED ORDER — FENTANYL CITRATE PF 50 MCG/ML IJ SOSY
25.0000 ug | PREFILLED_SYRINGE | INTRAMUSCULAR | Status: DC | PRN
Start: 2023-07-28 — End: 2023-07-28

## 2023-07-28 MED ORDER — SODIUM CHLORIDE 0.9 % IR SOLN
Status: DC | PRN
  Administered 2023-07-28: 9000 mL
  Administered 2023-07-28: 3000 mL

## 2023-07-28 MED ORDER — SUGAMMADEX SODIUM 200 MG/2ML IV SOLN
INTRAVENOUS | Status: DC | PRN
  Administered 2023-07-28: 200 mg via INTRAVENOUS

## 2023-07-28 MED ORDER — GEMCITABINE CHEMO FOR BLADDER INSTILLATION 2000 MG
2000.0000 mg | Freq: Once | INTRAVENOUS | Status: DC
Start: 2023-07-28 — End: 2023-07-28
  Filled 2023-07-28: qty 52.6

## 2023-07-28 MED ORDER — ORAL CARE MOUTH RINSE: 15.0000 mL | Freq: Once | OROMUCOSAL | Status: AC

## 2023-07-28 MED ORDER — CEFAZOLIN SODIUM-DEXTROSE 2-4 GM/100ML-% IV SOLN
2.0000 g | INTRAVENOUS | Status: AC
  Administered 2023-07-28: 2 g via INTRAVENOUS
  Filled 2023-07-28: qty 100

## 2023-07-28 MED ORDER — POLYETHYLENE GLYCOL 3350 17 G PO PACK: 17.0000 g | PACK | Freq: Every day | ORAL | 0 refills | Status: DC

## 2023-07-28 MED ORDER — FENTANYL CITRATE (PF) 100 MCG/2ML IJ SOLN
INTRAMUSCULAR | Status: DC | PRN
  Administered 2023-07-28: 25 ug via INTRAVENOUS

## 2023-07-28 MED ORDER — ACETAMINOPHEN 500 MG PO TABS
1000.0000 mg | ORAL_TABLET | Freq: Once | ORAL | Status: AC
  Administered 2023-07-28: 1000 mg via ORAL
  Filled 2023-07-28: qty 2

## 2023-07-28 MED ORDER — FENTANYL CITRATE (PF) 100 MCG/2ML IJ SOLN
INTRAMUSCULAR | Status: AC
  Filled 2023-07-28: qty 2

## 2023-07-28 MED ORDER — ROCURONIUM BROMIDE 10 MG/ML (PF) SYRINGE
PREFILLED_SYRINGE | INTRAVENOUS | Status: DC | PRN
  Administered 2023-07-28: 30 mg via INTRAVENOUS
  Administered 2023-07-28: 20 mg via INTRAVENOUS

## 2023-07-28 MED ORDER — PROPOFOL 10 MG/ML IV BOLUS
INTRAVENOUS | Status: AC
  Filled 2023-07-28: qty 20

## 2023-07-28 MED ORDER — PHENYLEPHRINE HCL (PRESSORS) 10 MG/ML IV SOLN
INTRAVENOUS | Status: AC
  Filled 2023-07-28: qty 1

## 2023-07-28 MED ORDER — DEXAMETHASONE SODIUM PHOSPHATE 10 MG/ML IJ SOLN
INTRAMUSCULAR | Status: DC | PRN
  Administered 2023-07-28: 8 mg via INTRAVENOUS

## 2023-07-28 MED ORDER — LACTATED RINGERS IV SOLN: INTRAVENOUS | Status: DC | PRN

## 2023-07-28 MED ORDER — PROPOFOL 10 MG/ML IV BOLUS
INTRAVENOUS | Status: DC | PRN
  Administered 2023-07-28: 50 mg via INTRAVENOUS

## 2023-07-28 MED ORDER — LIDOCAINE 2% (20 MG/ML) 5 ML SYRINGE
INTRAMUSCULAR | Status: DC | PRN
  Administered 2023-07-28: 40 mg via INTRAVENOUS

## 2023-07-28 MED ORDER — PHENYLEPHRINE 80 MCG/ML (10ML) SYRINGE FOR IV PUSH (FOR BLOOD PRESSURE SUPPORT)
PREFILLED_SYRINGE | INTRAVENOUS | Status: AC
  Filled 2023-07-28: qty 10

## 2023-07-28 SURGICAL SUPPLY — 22 items
BAG URO CATCHER STRL LF (MISCELLANEOUS) ×2 IMPLANT
BASKET ZERO TIP NITINOL 2.4FR (BASKET) IMPLANT
CATH FOLEY 2W COUNCIL 20FR 5CC (CATHETERS) ×1 IMPLANT
CATH URETL OPEN 5X70 (CATHETERS) ×2 IMPLANT
CLOTH BEACON ORANGE TIMEOUT ST (SAFETY) ×2 IMPLANT
EXTRACTOR STONE 1.7FRX115CM (UROLOGICAL SUPPLIES) IMPLANT
FIBER LASER MOSES 200 DFL (Laser) IMPLANT
FIBER LASER MOSES 365 DFL (Laser) IMPLANT
GLOVE SURG LX STRL 8.0 MICRO (GLOVE) ×2 IMPLANT
GOWN STRL REUS W/ TWL XL LVL3 (GOWN DISPOSABLE) ×2 IMPLANT
GUIDEWIRE ANG ZIPWIRE 038X150 (WIRE) ×1 IMPLANT
GUIDEWIRE STR DUAL SENSOR (WIRE) ×1 IMPLANT
KIT TURNOVER KIT A (KITS) IMPLANT
LOOP CUT BIPOLAR 24F LRG (ELECTROSURGICAL) ×1 IMPLANT
MANIFOLD NEPTUNE II (INSTRUMENTS) ×2 IMPLANT
PACK CYSTO (CUSTOM PROCEDURE TRAY) ×2 IMPLANT
SHEATH NAVIGATOR HD 12/14X28 (SHEATH) IMPLANT
SHEATH NAVIGATOR HD 12/14X36 (SHEATH) IMPLANT
SYR TOOMEY IRRIG 70ML (MISCELLANEOUS) ×2 IMPLANT
SYRINGE TOOMEY IRRIG 70ML (MISCELLANEOUS) ×1 IMPLANT
TUBING CONNECTING 10 (TUBING) ×2 IMPLANT
TUBING UROLOGY SET (TUBING) ×2 IMPLANT

## 2023-07-28 NOTE — Anesthesia Procedure Notes (Signed)
 Procedure Name: Intubation Date/Time: 07/28/2023 11:52 AM  Performed by: Florene Route, CRNAPatient Re-evaluated:Patient Re-evaluated prior to induction Oxygen Delivery Method: Circle system utilized Preoxygenation: Pre-oxygenation with 100% oxygen Laryngoscope Size: 3 and Miller Grade View: Grade I Tube size: 7.5 mm Number of attempts: 1 Airway Equipment and Method: Stylet Placement Confirmation: ETT inserted through vocal cords under direct vision, positive ETCO2 and breath sounds checked- equal and bilateral Secured at: 21 cm Tube secured with: Tape Dental Injury: Teeth and Oropharynx as per pre-operative assessment

## 2023-07-28 NOTE — Anesthesia Postprocedure Evaluation (Signed)
 Anesthesia Post Note  Patient: Richard Stanley  Procedure(s) Performed: TRANSURETHRAL RESECTION OF BLADDER TUMOR (TURBT) with GEMCITABINE AND RETROGRADE PYELOGRAM/URETEROSCOPY (Bladder)     Patient location during evaluation: PACU Anesthesia Type: General Level of consciousness: awake and alert Pain management: pain level controlled Vital Signs Assessment: post-procedure vital signs reviewed and stable Respiratory status: spontaneous breathing, nonlabored ventilation, respiratory function stable and patient connected to nasal cannula oxygen Cardiovascular status: blood pressure returned to baseline and stable Postop Assessment: no apparent nausea or vomiting Anesthetic complications: no  No notable events documented.  Last Vitals:  Vitals:   07/28/23 1330 07/28/23 1345  BP: (!) (P) 173/83 (!) 164/85  Pulse: 65 64  Resp: (!) 23 20  Temp:    SpO2: 99% 98%    Last Pain:  Vitals:   07/28/23 1345  TempSrc:   PainSc: 0-No pain                 Mikeria Valin L Shameeka Silliman

## 2023-07-28 NOTE — Op Note (Signed)
 Operative Note  Preoperative diagnosis:  1.  Bladder tumor   Postoperative diagnosis: 1.  Bladder tumor and urethral stricture   Procedure(s): 1.  MediumTURBT 2.5 cm  2. Instillation of gemcitabine 3. Urethral dilation  4. Bilateral retrograde pyleogram   Surgeon: Vilma Prader MD  Assistants:  None  Anesthesia:  General  Complications:  None  EBL: Minimal   Specimens: 1.  Bladder tumor  Drains/Catheters: 1.  20 French urethral catheter  Retrograde pyelogram interpretation: Bilateral retrograde pyelograms demonstrate no filling defects and good drainage.  Intraoperative findings:   Large bulbous tumor on the posterior lateral bladder wall with surrounding erythema and papillary tumor all resected or burned there was significant surrounding erythema Number of tumors:          1 Size of largest tumor:                  2.5  cm   Characteristics of tumors: Nodular Primary  Suspicious for Carcinoma in situ:    Yes   Bimanual exam under anesthesia:    No  Visually complete resection:          Yes  Visualization of detrusor muscle in resection base:      Yes  Visual evaluation for perforation:          No   Indication:  Richard Stanley is a 88 y.o. male with recurrent gross hematuria on Xarelto he came to clinic where cystoscopy was done of his lesion was able to see a tumor on the right bladder wall he is here today for management of that tumor.  Patient stopped Xarelto 3 days prior to this procedure.  All the risks, benefits were discussed with the patient to include but not limited to infection, pain, bleeding, damage to adjacent structures, need for further operations, adverse reaction to anesthesia and death.  Patient understands these risks and agrees to proceed with the operation as planned.    Description of procedure: After informed consent was obtained from the patient, the patient was taken to the operating room. General anesthesia was administered. The  patient was placed in dorsal lithotomy position and prepped and draped in usual sterile fashion. Sequential compression devices were applied to lower extremities at the beginning of the case for DVT prophylaxis. Antibiotics were infused prior to surgery start time. A surgical time-out was performed to properly identify the patient, the surgery to be performed, and the surgical site.     We then passed the 21-French rigid cystoscope down the urethra and into the bladder under direct vision without any difficulty.  Had a small narrowing which to 22 French cystoscope was able to pass with some degree of pressure. The bladder was inspected with 30 and 70 degree lenses. Once in the bladder, systematic evaluation of bladder revealed a tumor on the posterior bladder wall about 2.5 cm in size as well as surrounding erythema concern for possible CIS.Marland Kitchen The ureteral orfices were in orthotopic position and not involved.  To get the 61 French resectoscope into the bladder a wire was then placed in the bladder and then [place] sounds were then used to dilate the urethra to 28 [place].   We then removed the cystoscope and then passed down the 26 French resectoscope sheath down the urethra into the bladder under direct vision with the visual obturator. The tumor was resected down to muscle. The TUR bladder tumor chips were retrieved from the bladder and each region of resection was passed off  the field as a separate specimen.  Hemostasis was achieved using electrocautery.  After the tumor was removed bilateral retrograde pyelograms were performed which showed no filling defects or tumors in the upper tracts.  We then proceeded with removing the resectoscope and placing a wire through the resectoscope sheath and then placed in a 20 [place] council catheter.  The patient tolerated the procedure well with no complication and was awoken from anesthesia and taken to recovery in stable condition.     While in the post-operative  care unit, as a separate procedure, 2000 mg of gemcitabine in 50 mL of water was instilled in the bladder through the catheter and the catheter was plugged. This will remain indwelling for approximately one hour. It will then be drained from the bladder and the catheter will be removed and the patient discharged home.  Plan: Patient will be void trial on Friday then follow-up in 1 to 2 weeks for pathology review he was started Xarelto and 2 to 3 days.  Vilma Prader  [facility]

## 2023-07-28 NOTE — Anesthesia Preprocedure Evaluation (Addendum)
 Anesthesia Evaluation  Patient identified by MRN, date of birth, ID band Patient awake    Reviewed: Allergy & Precautions, NPO status , Patient's Chart, lab work & pertinent test results  Airway Mallampati: I  TM Distance: >3 FB Neck ROM: Full    Dental  (+) Edentulous Upper, Edentulous Lower, Dental Advisory Given   Pulmonary asthma , COPD (2L O2),  oxygen dependent, former smoker   Pulmonary exam normal breath sounds clear to auscultation       Cardiovascular hypertension, Pt. on medications Normal cardiovascular exam+ dysrhythmias Atrial Fibrillation  Rhythm:Regular Rate:Normal  TTE 2019 EF normal, no significant valvular abnormalities   Neuro/Psych  Headaches PSYCHIATRIC DISORDERS Anxiety Depression   Dementia    GI/Hepatic negative GI ROS, Neg liver ROS,,,  Endo/Other  negative endocrine ROS    Renal/GU Renal InsufficiencyRenal disease  negative genitourinary   Musculoskeletal negative musculoskeletal ROS (+)    Abdominal   Peds  Hematology  (+) Blood dyscrasia (xarelto)   Anesthesia Other Findings   Reproductive/Obstetrics                             Anesthesia Physical Anesthesia Plan  ASA: 3  Anesthesia Plan: General   Post-op Pain Management: Tylenol PO (pre-op)*   Induction: Intravenous  PONV Risk Score and Plan: 2 and Dexamethasone, Ondansetron and Treatment may vary due to age or medical condition  Airway Management Planned: Oral ETT  Additional Equipment:   Intra-op Plan:   Post-operative Plan: Extubation in OR  Informed Consent: I have reviewed the patients History and Physical, chart, labs and discussed the procedure including the risks, benefits and alternatives for the proposed anesthesia with the patient or authorized representative who has indicated his/her understanding and acceptance.     Dental advisory given  Plan Discussed with: CRNA  Anesthesia  Plan Comments:        Anesthesia Quick Evaluation

## 2023-07-28 NOTE — Discharge Instructions (Addendum)
 Transurethral Resection of Bladder Tumor (TURBT) or Bladder Biopsy   Definition:  Transurethral Resection of the Bladder Tumor is a surgical procedure used to diagnose and remove tumors within the bladder. TURBT is the most common treatment for early stage bladder cancer.  General instructions:     Your recent bladder surgery requires very little post hospital care but some definite precautions.  Despite the fact that no skin incisions were used, the area around the bladder incisions are raw and covered with scabs to promote healing and prevent bleeding. Certain precautions are needed to insure that the scabs are not disturbed over the next 2-4 weeks while the healing proceeds.  Because the raw surface inside your bladder and the irritating effects of urine you may expect frequency of urination and/or urgency (a stronger desire to urinate) and perhaps even getting up at night more often. This will usually resolve or improve slowly over the healing period. You may see some blood in your urine over the first 6 weeks. Do not be alarmed, even if the urine was clear for a while. Get off your feet and drink lots of fluids until clearing occurs. If you start to pass clots or don't improve call us.  Catheter: Due to the nature of this surgery some patients need to be discharged with a catheter. If you are discharged with a catheter instructions on how to care for the catheter will be attached to your discharge paperwork. You will be called to schedule an appointment to remove the catheter.   Diet:  You may return to your normal diet immediately. Because of the raw surface of your bladder, alcohol, spicy foods, foods high in acid and drinks with caffeine may cause irritation or frequency and should be used in moderation. To keep your urine flowing freely and avoid constipation, drink plenty of fluids during the day (8-10 glasses). Tip: Avoid cranberry juice because it is very acidic.  Activity:  Do not  lift more than 10 LBS for 2 weeks Do not strain with bowel movents.  Your physical activity doesn't need to be restricted. However, if you are very active, you may see some blood in the urine. We suggest that you reduce your activity under the circumstances until the bleeding has stopped.  Bowels:  It is important to keep your bowels regular during the postoperative period. Straining with bowel movements can cause bleeding. A bowel movement every other day is reasonable. Use a mild laxative if needed, such as milk of magnesia 2-3 tablespoons, or 2 Dulcolax tablets. Call if you continue to have problems. If you had been taking narcotics for pain, before, during or after your surgery, you may be constipated. Take a laxative if necessary.    Medication:  You should resume your pre-surgery medications unless told not to. In addition you may be given an antibiotic to prevent or treat infection. Antibiotics are not always necessary. All medication should be taken as prescribed until the bottles are finished unless you are having an unusual reaction to one of the drugs.  Stop taking Xarelto until catheter is removed.  If urine stops flowing from catheter, you may flush with 40 ml of saline (provided), using a syringe (provided).Or you may report to Emergency Department, or if it is daytime, you may come to the urology clinic.

## 2023-07-28 NOTE — Progress Notes (Signed)
 I was called to bedside to concerns of Richard Stanley post gemcitabine instillation. Urine was dark but no clots present. Patient was irrigated to very light pink and remained that color. Intructions given to family that if catheter stops drianing can try to unclog with 40cc of NS. If this does not work instructed to come to clinic or ER. Do not think this will be the case as urine is veyr clear.

## 2023-07-28 NOTE — Transfer of Care (Signed)
 Immediate Anesthesia Transfer of Care Note  Patient: Richard Stanley  Procedure(s) Performed: TRANSURETHRAL RESECTION OF BLADDER TUMOR (TURBT) with GEMCITABINE AND RETROGRADE PYELOGRAM/URETEROSCOPY (Bladder)  Patient Location: PACU  Anesthesia Type:General  Level of Consciousness: awake and alert   Airway & Oxygen Therapy: Patient connected to face mask  Post-op Assessment: Report given to RN  Post vital signs: stable  Last Vitals:  Vitals Value Taken Time  BP 185/90 07/28/23 1307  Temp    Pulse 58 07/28/23 1312  Resp 15 07/28/23 1312  SpO2 100 % 07/28/23 1312  Vitals shown include unfiled device data.  Last Pain:  Vitals:   07/28/23 1004  TempSrc:   PainSc: 0-No pain         Complications: No notable events documented.

## 2023-07-31 LAB — SURGICAL PATHOLOGY

## 2023-08-04 ENCOUNTER — Telehealth: Payer: Self-pay | Admitting: Cardiology

## 2023-08-04 NOTE — Telephone Encounter (Signed)
 Wife advised that yes patient should remain taking his Xarelto while wearing the heart monitor. She is agreeable to plan.

## 2023-08-04 NOTE — Telephone Encounter (Signed)
  1. Is this related to a heart monitor you are wearing?  (If the patient says no, please ask     if they are caling about ICD/pacemaker.) No wearing heart monitor yet.   2. What is your issue?? Wants to know if he should take his Xarelto while wearing heart monitor   Please route to covering RN/CMA/RMA for results. Route to monitor technicians or your monitor tech representative for your site for any technical concerns

## 2023-08-07 DIAGNOSIS — R42 Dizziness and giddiness: Secondary | ICD-10-CM | POA: Diagnosis not present

## 2023-08-07 DIAGNOSIS — I480 Paroxysmal atrial fibrillation: Secondary | ICD-10-CM | POA: Diagnosis not present

## 2023-08-27 DIAGNOSIS — C679 Malignant neoplasm of bladder, unspecified: Secondary | ICD-10-CM | POA: Insufficient documentation

## 2023-08-27 NOTE — Assessment & Plan Note (Deleted)
 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

## 2023-08-27 NOTE — Progress Notes (Deleted)
 [facility] CONSULT NOTE  Patient Care Team: Elder Negus, NP as PCP - General (Adult Health Nurse Practitioner)  ASSESSMENT & PLAN:  Richard Stanley is a 88 y.o.male with history of A-fib on Xarelto, anxiety, asthma, CKD, benign lung nodule, depression, hypertension, hyperlipidemia being [facility] cancer.  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  No orders of the defined types were placed in this encounter.   The total time spent in the appointment was {CHL ONC TIME VISIT - MVHQI:[id number]} encounter with patients including review of chart and various tests results, discussions about plan of care and coordination of care plan   All questions were answered. The patient knows to call the clinic with any problems, questions or concerns. No barriers to learning was detected.  Melven Sartorius, MD 3/20/202511:08 AM  CHIEF COMPLAINTS/PURPOSE OF CONSULTATION:  ***  HISTORY OF PRESENTING ILLNESS:  Richard Stanley 88 y.o. male is here because of ***  03/19/26 CT CAP 1. Acute compression fracture of L3 with 60% vertebral body height loss and 3 mm of retropulsion of the superior endplate. 2. Acute mildly displaced fracture of the left transverse process of L3. 3. No evidence of acute traumatic injury in the chest. 4. Irregular bladder wall thickening greatest about the dome of the bladder. Differential considerations include obstructive uropathy from enlarged prostate, cystitis, or malignancy. Correlation with cystoscopy is recommended.   07/10/23 presenting to the ED for hematuria  CT AP 1. Bladder wall thickening, possibly cystitis. 9 mm hyperdensity along the nondependent surface of the bladder with adjacent bladder wall thickening. This is suspicious for transitional cell neoplasm or possibly adherent clot. Suggest  cystoscopy for further evaluation. 2. No renal or ureteral stone or obstruction. 3. Prostate gland is enlarged. 4. Cholelithiasis without evidence of acute cholecystitis. 5. Aortic atherosclerosis. 6. New L1 compression deformity may represent acute compression.   07/28/23 A. BLADDER TUMOR, TURBT:  Infiltrating high grade urothelial carcinoma with clear cell differentiation. see comment.   Comment:  The neoplasm shows solid nested growth pattern with clear cell features.  The differential diagnosis includes high grade urothelial carcinoma with  clear cell differentiation versus metastatic carcinoma from adjacent  organs. Immunohistochemistry stains show these cells are positive for  cytokeratin ck8/18, high molecular weight cytokeratin, p63, negative for  gata3, nkx3.1, p504s (prostate markers), pax8 and ca-ix (clear cell  renal cell markers), synaptophysin and chromogranin (paraganglioma  markers). The staining pattern supports the diagnosis of high-grade  urothelial carcinoma.   There is extensive invasive carcinoma focally involving thin muscle  bundles. It is difficult to distinguish whether these muscle bundles  represent muscularis propria (Detrusor muscle) that has been partially  destroyed by the invasive cancer or muscularis mucosae (lamina propria).   Oncology History   No history exists.    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   Headache    HYPERLIPIDEMIA 06/18/2009   Nodule of right lung  Other diseases of lung, not elsewhere  classified 06/18/2009   Rash 10/01/2010   Thyroid nodule 10/01/2010    SURGICAL HISTORY: Past Surgical History:  Procedure Laterality Date   CARDIAC CATHETERIZATION  1998   normal per pt   CARDIOVERSION N/A 11/12/2021   Procedure: CARDIOVERSION;  Surgeon: Yates Decamp, MD;  Location: [facility] ENDOSCOPY;  Service: Cardiovascular;  Laterality: N/A;    SOCIAL HISTORY: Social History   Socioeconomic History   Marital status: Married    Spouse name: Not on file   Number of children: 2   Years of education: Not on file   Highest education level: Not on file  Occupational History   Occupation: retired from [employer]  Tobacco Use   Smoking status: Former    Current packs/day: 0.00    Average packs/day: 1 pack/day for 38.0 years (38.0 ttl pk-yrs)    Types: Cigarettes    Start date: 06/10/1951    Quit date: 06/09/1989    Years since quitting: 34.2   Smokeless tobacco: Never   Tobacco comments:    Also smoke pipe in 1969 to 1991, several times a day  Vaping Use   Vaping status: Never Used  Substance and Sexual Activity   Alcohol use: Not Currently   Drug use: No   Sexual activity: Not Currently  Other Topics Concern   Not on file  Social History Narrative   Pt lives with wife, son lives with them.   Social Drivers of Corporate investment banker Strain: Low Risk  (05/13/2023)   Received from [facility]   Overall Financial Resource Strain (CARDIA)    Difficulty of Paying Living Expenses: Not very hard  Food Insecurity: No Food Insecurity (05/13/2023)   Received from [facility]   Hunger Vital Sign    Worried About Running Out of Food in the Last Year: Never true    Ran Out of Food in the Last Year: Never true  Transportation Needs: No Transportation Needs (05/13/2023)   Received from [facility] - Transportation    Lack of Transportation (Medical): No    Lack of Transportation (Non-Medical): No  Physical Activity: Unknown (05/13/2023)   Received from  [facility]   Exercise Vital Sign    Days of Exercise per Week: 0 days    Minutes of Exercise per Session: Not on file  Stress: No Stress Concern Present (05/13/2023)   Received from [facility] - Occupational Stress Questionnaire    Feeling of Stress : Not at all  Social Connections: Socially Isolated (05/13/2023)   Received from [facility]   Social Network    How would you rate your social network (family, work, friends)?: Little participation, lonely and socially isolated  Intimate Partner Violence: Not At Risk (05/13/2023)   Received from [facility]   HITS    Over the last 12 months how often did your partner physically hurt you?: Never    Over the last 12 months how often did your partner insult you or talk down to you?: Never    Over the last 12 months how often did your partner threaten you with physical harm?: Never    Over the last 12 months how often did your partner scream or curse at you?: Never    FAMILY HISTORY: Family History  Problem Relation Age of Onset   Colon cancer Father    Cancer Father  colon   Other Sister        died after knee surgery    ALLERGIES:  has no known allergies.  MEDICATIONS:  Current Outpatient Medications  Medication Sig Dispense Refill   amiodarone (PACERONE) 100 MG tablet Take 100 mg by mouth daily.     atorvastatin (LIPITOR) 20 MG tablet Take 1 tablet (20 mg total) by mouth daily at 6 PM. (Patient taking differently: Take 20 mg by mouth daily.) 30 tablet 6   FLUoxetine (PROZAC) 20 MG capsule Take 20 mg by mouth daily.     polyethylene glycol (MIRALAX) 17 g packet Take 17 g by mouth daily. 14 each 0   Rivaroxaban (XARELTO) 15 MG TABS tablet Take 1 tablet (15 mg total) by mouth daily with supper. (Patient taking differently: Take 15 mg by mouth daily.) 30 tablet 3   No current facility-administered medications for this visit.    REVIEW OF SYSTEMS:   All relevant systems were  reviewed with the patient and are negative.  PHYSICAL EXAMINATION: ECOG PERFORMANCE STATUS: {CHL ONC ECOG [phone]}  There were no vitals filed for this visit. There were no vitals filed for this visit.  GENERAL: alert, no distress and comfortable SKIN: skin color is normal, no jaundice, rashes or significant lesions EYES: sclera clear OROPHARYNX: no exudate, no erythema NECK: supple LYMPH:  no palpable lymphadenopathy in the cervical, axillary regions LUNGS: Effort normal, no respiratory distress.  Clear to auscultation bilaterally HEART: regular rate & rhythm and no lower extremity edema ABDOMEN: soft, non-tender and nondistended Musculoskeletal: no point tenderness NEURO: no focal motor/sensory deficits  LABORATORY DATA:  I have reviewed the data as listed Lab Results  Component Value Date   WBC 10.7 (H) 07/10/2023   HGB 15.1 07/10/2023   HCT 47.1 07/10/2023   MCV 92.9 07/10/2023   PLT 229 07/10/2023   Recent Labs    01/10/23 1320 03/20/23 1557 03/29/23 0426 06/11/23 0652 07/10/23 1752  NA 138   < > 133* 138 138  K 4.2   < > 4.2 4.4 4.4  CL 101   < > 93* 100 100  CO2 26   < > 28 28 26   GLUCOSE 107*   < > 118* 147* 113*  BUN 26*   < > 19 36* 28*  CREATININE 1.81*   < > 1.65* 1.98* 1.82*  CALCIUM 8.8*   < > 9.2 8.9 9.0  GFRNONAA 36*   < > 40* 32* 36*  PROT 5.8*  --  6.0*  --  6.5  ALBUMIN 3.4*  --  3.2*  --  3.4*  AST 23  --  25  --  26  ALT 26  --  40  --  26  ALKPHOS 76  --  95  --  174*  BILITOT 0.8  --  0.7  --  0.7   < > = values in this interval not displayed.    RADIOGRAPHIC STUDIES: I have personally reviewed the radiological images as listed and agreed with the findings in the report. DG C-Arm 1-60 Min-No Report Result Date: 07/28/2023 Fluoroscopy was utilized by the requesting physician.  No radiographic interpretation.   DG C-Arm 1-60 Min-No Report Result Date: 07/28/2023 Fluoroscopy was utilized by the requesting physician.  No  radiographic interpretation.

## 2023-08-28 ENCOUNTER — Inpatient Hospital Stay

## 2023-09-10 ENCOUNTER — Ambulatory Visit: Attending: Nurse Practitioner

## 2023-09-10 DIAGNOSIS — I480 Paroxysmal atrial fibrillation: Secondary | ICD-10-CM

## 2023-09-10 DIAGNOSIS — R42 Dizziness and giddiness: Secondary | ICD-10-CM

## 2023-09-15 ENCOUNTER — Ambulatory Visit: Payer: Medicare HMO | Admitting: Cardiology

## 2023-09-16 ENCOUNTER — Telehealth: Payer: Self-pay | Admitting: Cardiology

## 2023-09-16 DIAGNOSIS — I480 Paroxysmal atrial fibrillation: Secondary | ICD-10-CM

## 2023-09-16 NOTE — Telephone Encounter (Signed)
 Wife is returning call about results. Please advise

## 2023-09-16 NOTE — Telephone Encounter (Signed)
 He is at high risk and should be referred to [facility], however his age is an issue but needs to have EP eval

## 2023-09-16 NOTE — Telephone Encounter (Signed)
 Please let me know your thoughts as you saw and ordered this wondering if you planned to stop anticoag. Unless life threatening bleed or risk of frequent fall, his stroke risk is high and has proved to have recurrence of AF in past

## 2023-09-16 NOTE — Telephone Encounter (Signed)
 Spoke with wife per DPR and she states she received results of monitor. However she would like to know if he can stop his xarelto.

## 2023-09-17 NOTE — Telephone Encounter (Signed)
 Spoke with the patients wife who states the patient found out last month that he has bladder cancer.   Advised wife of Dr Jacinto Halim and Ernest's recommendations and she voiced understanding.   Referral for EP placed.  Message sent to EP scheduler.

## 2023-09-22 ENCOUNTER — Encounter: Payer: Self-pay | Admitting: Cardiology

## 2023-09-22 ENCOUNTER — Ambulatory Visit: Attending: Cardiology | Admitting: Cardiology

## 2023-09-22 VITALS — BP 122/68 | HR 89 | Ht 67.0 in | Wt 126.4 lb

## 2023-09-22 DIAGNOSIS — I480 Paroxysmal atrial fibrillation: Secondary | ICD-10-CM

## 2023-09-22 DIAGNOSIS — D6869 Other thrombophilia: Secondary | ICD-10-CM

## 2023-09-22 NOTE — Progress Notes (Signed)
 Electrophysiology Office Note:   Date:  09/22/2023  ID:  DOVID BARTKO, DOB [birth year], MRN [id number]  Primary Cardiologist: None Electrophysiologist: Ardeen Kohler, MD      History of Present Illness:   Richard Stanley is a 88 y.o. male with h/o paroxysmal AF (on Xarelto), thyroid nodule, COPD, HTN, HLD, lung nodule, depression, asthma, CKD III who is being seen today for evaluation for Watchman device implant at the request of Charles Connor, NP.   Discussed the use of AI scribe software for clinical note transcription with the patient, who gave verbal consent to proceed.  History of Present Illness He was diagnosed with bladder cancer two months ago and underwent surgery, which did not remove all the cancerous tissue. Another surgery was recommended, but he has not yet pursued it.  Per his wife, they were told that he had an aggressive form of bladder cancer.  Xarelto had been discontinued in the setting of hematuria but has since been resumed.  Despite being on Xarelto, he is currently experiencing no hematuria.He has a history of atrial fibrillation and is taking Xarelto to reduce the risk of stroke. A recent heart monitor did not show any atrial fibrillation during the monitoring period, and he has had no recent known occurrences of AF.  He has experienced falls in the past, leading to emergency room visits, but currently, he is not falling as much due to reduced mobility.  He is extremely hard of hearing.  Additional information was provided by his wife.  Review of systems complete and found to be negative unless listed in HPI.   EP Information / Studies Reviewed:    EKG is not ordered today. EKG from 07/21/23 reviewed which showed sinus rhythm.      EKG 08/16/17:    Echo 03/07/20: Echocardiogram 03/07/2020: Normal LV systolic function with visual EF 55-60%. Left ventricle cavity is normal in size. Normal global wall motion. Normal diastolic filling pattern, normal LAP. Mild  (Grade I) aortic regurgitation. Compared to prior study dated 08/17/2017 no significant changes except, AR is new.    Risk Assessment/Calculations:    CHA2DS2-VASc Score = 3   This indicates a 3.2% annual risk of stroke. The patient's score is based upon: CHF History: 0 HTN History: 1 Diabetes History: 0 Stroke History: 0 Vascular Disease History: 0 Age Score: 2 Gender Score: 0             Physical Exam:   VS:  BP 122/68   Pulse 89   Ht 5\' 7"  (1.702 m)   Wt 126 lb 6.4 oz (57.3 kg)   SpO2 97%   BMI 19.80 kg/m    Wt Readings from Last 3 Encounters:  09/22/23 126 lb 6.4 oz (57.3 kg)  07/28/23 149 lb 14.6 oz (68 kg)  07/22/23 149 lb 14.6 oz (68 kg)     GEN: Frail, elderly man in no acute distress NECK: No JVD CARDIAC: Normal rate, regular rhythm RESPIRATORY:  Clear to auscultation without rales, wheezing or rhonchi  ABDOMEN: Soft, non-distended EXTREMITIES:  No edema; No deformity  NEURO: Very hard of hearing  ASSESSMENT AND PLAN:   I have seen Laurier Poplin in the office today who is being considered for a Watchman left atrial appendage closure device. The patient has a history of atrial fibrillation, CHA2DS2-VASc score of 3 and unadjusted ischemic stroke rate of 3.2% per year.  His oral anticoagulation had been interrupted due to hematuria and falls.  He has since resumed Xarelto  and is tolerating this without hematuria per his wife.  No recent falls.  He is using a rolling walker.  Unfortunately, the patient now has "aggressive bladder cancer " per his wife.  He is not planning to pursue aggressive treatment options, unclear what his life expectancy is at this point.  He is also quite frail.  Given these factors, I do not feel that there is clear benefit from Watchman device and that procedural risk is likely prohibitive.  We had a long discussion regarding risk and benefits of oral anticoagulation in the setting of stroke prophylaxis.  Patient's wife voiced  understanding.  For now, they intend to remain on Xarelto 15 mg once daily with low threshold to discontinue if he has recurrent bleeding or falls.  HAS-BLED score 3 Hypertension Yes  Abnormal renal and liver function (Dialysis, transplant, Cr >2.26 mg/dL /Cirrhosis or Bilirubin >2x Normal or AST/ALT/AP >3x Normal) No  Stroke No  Bleeding Yes  Labile INR (Unstable/high INR) No  Elderly (>65) Yes  Drugs or alcohol (>= 8 drinks/week, anti-plt or NSAID) No   CHA2DS2-VASc Score = 3  The patient's score is based upon: CHF History: 0 HTN History: 1 Diabetes History: 0 Stroke History: 0 Vascular Disease History: 0 Age Score: 2 Gender Score: 0       ASSESSMENT AND PLAN: Paroxysmal Atrial Fibrillation (ICD10:  I48.0): Peers to have a electively low burden of AF. The patient's CHA2DS2-VASc score is 3, indicating a 3.2% annual risk of stroke.   Continue amiodarone 100 mg once daily.  Secondary Hypercoagulable State (ICD10:  D68.69) The patient is at significant risk for stroke/thromboembolism based upon his CHA2DS2-VASc Score of 3.  We had a long discussion regarding risk and benefits of oral anticoagulation in the setting of stroke prophylaxis.  Patient's wife voiced understanding.  For now, they intend to remain on Xarelto 15 mg once daily with low threshold to discontinue if he has recurrent bleeding or falls.  Follow up with general cardiology.   Total time of encounter: 70 minutes total time of encounter, including chart review, face-to-face patient care, coordination of care and counseling regarding high complexity medical decision making.   Signed, Ardeen Kohler, MD

## 2023-09-22 NOTE — Patient Instructions (Signed)
 Medication Instructions:  Your physician recommends that you continue on your current medications as directed. Please refer to the Current Medication list given to you today.  *If you need a refill on your cardiac medications before your next appointment, please call your pharmacy*  Follow-Up: At [facility], you and your health needs are our priority.  As part of our continuing mission to provide you with exceptional heart care, our providers are all part of one team.  This team includes your primary Cardiologist (physician) and Advanced Practice Providers or APPs (Physician Assistants and Nurse Practitioners) who all work together to provide you with the care you need, when you need it.  Your next appointment:   As needed with Dr. Daneil Dunker       1st Floor: - Lobby - Registration  - Pharmacy  - Lab - Cafe  2nd Floor: - PV Lab - Diagnostic Testing (echo, CT, nuclear med)  3rd Floor: - Vacant  4th Floor: - TCTS (cardiothoracic surgery) - AFib Clinic - Structural Heart Clinic - Vascular Surgery  - Vascular Ultrasound  5th Floor: - HeartCare Cardiology (general and EP) - Clinical Pharmacy for coumadin, hypertension, lipid, weight-loss medications, and med management appointments    Valet parking services will be available as well.

## 2023-10-29 ENCOUNTER — Encounter: Payer: Self-pay | Admitting: Cardiology

## 2023-10-29 ENCOUNTER — Ambulatory Visit: Attending: Cardiology | Admitting: Cardiology

## 2023-10-29 VITALS — BP 130/80 | HR 82 | Ht 72.0 in | Wt 140.2 lb

## 2023-10-29 DIAGNOSIS — I480 Paroxysmal atrial fibrillation: Secondary | ICD-10-CM

## 2023-10-29 DIAGNOSIS — I10 Essential (primary) hypertension: Secondary | ICD-10-CM

## 2023-10-29 DIAGNOSIS — C679 Malignant neoplasm of bladder, unspecified: Secondary | ICD-10-CM

## 2023-10-29 NOTE — Patient Instructions (Signed)
 Medication Instructions:  Your physician has recommended you make the following change in your medication: Stop Xarelto .  Stop Atorvastatin    *If you need a refill on your cardiac medications before your next appointment, please call your pharmacy*  Lab Work: none If you have labs (blood work) drawn today and your tests are completely normal, you will receive your results only by: MyChart Message (if you have MyChart) OR A paper copy in the mail If you have any lab test that is abnormal or we need to change your treatment, we will call you to review the results.  Testing/Procedures: none  Follow-Up: At [facility], you and your health needs are our priority.  As part of our continuing mission to provide you with exceptional heart care, our providers are all part of one team.  This team includes your primary Cardiologist (physician) and Advanced Practice Providers or APPs (Physician Assistants and Nurse Practitioners) who all work together to provide you with the care you need, when you need it.  Your next appointment:   As needed  Provider:   Knox Perl, MD    We recommend signing up for the patient portal called "MyChart".  Sign up information is provided on this After Visit Summary.  MyChart is used to connect with patients for Virtual Visits (Telemedicine).  Patients are able to view lab/test results, encounter notes, upcoming appointments, etc.  Non-urgent messages can be sent to your provider as well.   To learn more about what you can do with MyChart, go to [email or web address].   Other Instructions

## 2023-10-29 NOTE — Progress Notes (Signed)
 Cardiology Office Note:  .   Date:  10/29/2023  ID:  Richard Stanley, DOB Oct 20, 1935, MRN [id number] PCP: Jewell Mose, NP  [facility] Providers Cardiologist:  Knox Perl, MD Electrophysiologist:  Ardeen Kohler, MD   History of Present Illness: .   Richard Stanley is a 88 y.o. male with history of hypertension, hyperlipidemia, asthma, chronic kidney disease stage III, COPD.  Patient diagnosed with atrial fibrillation in March 2019.  He was hospitalized in March 2023 with A-fib with RVR. Patient has haddirect current cardioversion on 11/12/2021 and since then has been maintaining sinus rhythm.  He was evaluated by Dr. Ardeen Kohler for Watchman device in view of contraindication for anticoagulation due to recurrent urinary bleeding and was seen on on 09/22/2023 but felt not to be a candidate due to patient having been diagnosed aggressive bladder cancer and advanced age and patient preference.  He is now here for routine visit.  Discussed the use of AI scribe software for clinical note transcription with the patient, who gave verbal consent to proceed.  History of Present Illness Richard Stanley is an 88 year old male with aggressive bladder cancer who presents for follow up of paroxysmal atrial fibrillation, palliative care management. He is accompanied by his wife, Richard Stanley.  He continues on a small dose of amiodarone  to maintain regular heart rhythm, as he experiences discomfort with irregular rhythms.  He is not on any blood pressure medications. His current regimen includes an anti-anxiety medication and Miralax  for constipation.Wife requests that we stop medications unless needed for comfort. They have decided upon hospice/palliative care approach.  Labs    Lab Results  Component Value Date   NA 138 07/10/2023   K 4.4 07/10/2023   CO2 26 07/10/2023   GLUCOSE 113 (H) 07/10/2023   BUN 28 (H) 07/10/2023   CREATININE 1.82 (H) 07/10/2023   CALCIUM  9.0 07/10/2023   GFR 49.39 (L)  08/27/2011   GFRNONAA 36 (L) 07/10/2023      Latest Ref Rng & Units 07/10/2023    5:52 PM 06/11/2023    6:52 AM 03/29/2023    4:26 AM  BMP  Glucose 70 - 99 mg/dL 387  564  332   BUN 8 - 23 mg/dL 28  36  19   Creatinine 0.61 - 1.24 mg/dL 9.51  8.84  1.66   Sodium 135 - 145 mmol/L 138  138  133   Potassium 3.5 - 5.1 mmol/L 4.4  4.4  4.2   Chloride 98 - 111 mmol/L 100  100  93   CO2 22 - 32 mmol/L 26  28  28    Calcium  8.9 - 10.3 mg/dL 9.0  8.9  9.2       Latest Ref Rng & Units 07/10/2023    5:52 PM 06/11/2023    6:52 AM 03/29/2023    4:26 AM  CBC  WBC 4.0 - 10.5 K/uL 10.7  10.4  8.3   Hemoglobin 13.0 - 17.0 g/dL 06.3  01.6  01.0   Hematocrit 39.0 - 52.0 % 47.1  48.9  42.5   Platelets 150 - 400 K/uL 229  131  268    Lab Results  Component Value Date   HGBA1C 5.4 08/17/2017    Lab Results  Component Value Date   TSH 4.376 08/17/2017     ROS  Review of Systems  Cardiovascular:  Negative for chest pain, dyspnea on exertion and leg swelling.    Physical Exam:   VS:  BP 130/80 (BP Location: Left Arm, Patient Position: Sitting, Cuff Size: Normal)   Pulse 82   Ht 6' (1.829 m)   Wt 140 lb 3.2 oz (63.6 kg)   SpO2 93%   BMI 19.01 kg/m    Wt Readings from Last 3 Encounters:  10/29/23 140 lb 3.2 oz (63.6 kg)  09/22/23 126 lb 6.4 oz (57.3 kg)  07/28/23 149 lb 14.6 oz (68 kg)    Physical Exam Constitutional:      Appearance: He is underweight. He is ill-appearing.  Neck:     Vascular: No carotid bruit or JVD.  Cardiovascular:     Rate and Rhythm: Normal rate and regular rhythm.     Pulses: Intact distal pulses.     Heart sounds: Normal heart sounds. No murmur heard.    No gallop.  Pulmonary:     Effort: Pulmonary effort is normal.     Breath sounds: Normal breath sounds.  Abdominal:     General: Bowel sounds are normal.     Palpations: Abdomen is soft.  Musculoskeletal:     Right lower leg: No edema.     Left lower leg: No edema.  Neurological:     Mental Status:  He is alert.    Studies Reviewed: Aaron Aas     EKG:         Medications and allergies    No Known Allergies   Current Outpatient Medications:    amiodarone  (PACERONE ) 100 MG tablet, Take 100 mg by mouth daily., Disp: , Rfl:    FLUoxetine  (PROZAC ) 20 MG capsule, Take 20 mg by mouth daily., Disp: , Rfl:    No orders of the defined types were placed in this encounter.   Medications Discontinued During This Encounter  Medication Reason   Rivaroxaban  (XARELTO ) 15 MG TABS tablet Patient Preference   atorvastatin  (LIPITOR) 20 MG tablet Discontinued by provider   polyethylene glycol (MIRALAX ) 17 g packet Patient has not taken in last 30 days     ASSESSMENT AND PLAN: .      ICD-10-CM   1. Paroxysmal atrial fibrillation (HCC)  I48.0     2. Essential hypertension  I10     3. Malignant neoplasm of urinary bladder, unspecified site [facility])  C67.9 Do not attempt resuscitation (DNR)     Assessment & Plan Aggressive bladder cancer   He has aggressive bladder cancer, with a focus on palliative care due to his age and overall health. The emphasis is on comfort measures. Xarelto  is discontinued to prevent bleeding complications, and atorvastatin  is stopped as cholesterol management is not a priority. Antibiotics and IV fluids will be provided if necessary, but tube feeds will be avoided.  Paroxysmal atrial fibrillation   His paroxysmal atrial fibrillation is managed with amiodarone  to maintain a regular rhythm and alleviate discomfort. Amiodarone  100 mg once daily will continue to ensure comfort and quality of life.  DNR status   He has a DNR status with comfort measures only, in accordance with his and his family's wishes to avoid aggressive life-sustaining measures. This is documented in the medical chart, and a MOST form is provided to the family for emergency situations.  Goals of Care   The goals of care focus on comfort measures, avoiding aggressive treatments. He and his family understand  the prognosis and have chosen not to pursue life-sustaining treatments such as CPR or intubation due to his age, bladder cancer, and overall health. All care will align with comfort measures and DNR status.  Any changes in condition will be discussed with the family to reassess goals of care if needed. MOST form filled today. 20 min additional time spent with the patient discussing end of life issue and  filling most forms.   Signed,  Knox Perl, MD, [facility] 10/29/2023, 9:37 PM [facility] [address] [place], [place] [address] Phone: [phone]. Fax:  [phone]

## 2023-11-06 ENCOUNTER — Ambulatory Visit: Admitting: Cardiology

## 2024-02-08 ENCOUNTER — Emergency Department (HOSPITAL_COMMUNITY)

## 2024-02-08 ENCOUNTER — Emergency Department (HOSPITAL_COMMUNITY)
Admission: EM | Admit: 2024-02-08 | Discharge: 2024-02-09 | Disposition: A | Discharge status: Home / Self Care | Attending: Emergency Medicine | Admitting: Emergency Medicine

## 2024-02-08 ENCOUNTER — Other Ambulatory Visit: Payer: Self-pay

## 2024-02-08 ENCOUNTER — Encounter (HOSPITAL_COMMUNITY): Payer: Self-pay | Admitting: Emergency Medicine

## 2024-02-08 DIAGNOSIS — N3090 Cystitis, unspecified without hematuria: Secondary | ICD-10-CM | POA: Diagnosis not present

## 2024-02-08 DIAGNOSIS — I4891 Unspecified atrial fibrillation: Secondary | ICD-10-CM | POA: Diagnosis not present

## 2024-02-08 DIAGNOSIS — Z8551 Personal history of malignant neoplasm of bladder: Secondary | ICD-10-CM | POA: Diagnosis not present

## 2024-02-08 DIAGNOSIS — W1839XA Other fall on same level, initial encounter: Secondary | ICD-10-CM | POA: Diagnosis not present

## 2024-02-08 DIAGNOSIS — Z66 Do not resuscitate: Secondary | ICD-10-CM | POA: Diagnosis not present

## 2024-02-08 DIAGNOSIS — W19XXXA Unspecified fall, initial encounter: Secondary | ICD-10-CM

## 2024-02-08 DIAGNOSIS — I6789 Other cerebrovascular disease: Secondary | ICD-10-CM | POA: Diagnosis not present

## 2024-02-08 DIAGNOSIS — S0990XA Unspecified injury of head, initial encounter: Secondary | ICD-10-CM | POA: Diagnosis present

## 2024-02-08 DIAGNOSIS — Z9181 History of falling: Secondary | ICD-10-CM | POA: Insufficient documentation

## 2024-02-08 DIAGNOSIS — S93491A Sprain of other ligament of right ankle, initial encounter: Secondary | ICD-10-CM | POA: Insufficient documentation

## 2024-02-08 DIAGNOSIS — N189 Chronic kidney disease, unspecified: Secondary | ICD-10-CM | POA: Insufficient documentation

## 2024-02-08 DIAGNOSIS — M25571 Pain in right ankle and joints of right foot: Secondary | ICD-10-CM | POA: Diagnosis present

## 2024-02-08 DIAGNOSIS — J449 Chronic obstructive pulmonary disease, unspecified: Secondary | ICD-10-CM | POA: Insufficient documentation

## 2024-02-08 DIAGNOSIS — I129 Hypertensive chronic kidney disease with stage 1 through stage 4 chronic kidney disease, or unspecified chronic kidney disease: Secondary | ICD-10-CM | POA: Diagnosis not present

## 2024-02-08 LAB — COMPREHENSIVE METABOLIC PANEL WITH GFR
ALT: 15 U/L (ref 0–44)
AST: 22 U/L (ref 15–41)
Albumin: 3.8 g/dL (ref 3.5–5.0)
Alkaline Phosphatase: 116 U/L (ref 38–126)
Anion gap: 9 (ref 5–15)
BUN: 28 mg/dL — ABNORMAL HIGH (ref 8–23)
CO2: 26 mmol/L (ref 22–32)
Calcium: 9 mg/dL (ref 8.9–10.3)
Chloride: 103 mmol/L (ref 98–111)
Creatinine, Ser: 1.63 mg/dL — ABNORMAL HIGH (ref 0.61–1.24)
GFR, Estimated: 41 mL/min — ABNORMAL LOW (ref >60)
Glucose, Bld: 105 mg/dL — ABNORMAL HIGH (ref 70–99)
Potassium: 4.7 mmol/L (ref 3.5–5.1)
Sodium: 138 mmol/L (ref 135–145)
Total Bilirubin: 0.3 mg/dL (ref 0.0–1.2)
Total Protein: 6 g/dL — ABNORMAL LOW (ref 6.5–8.1)

## 2024-02-08 LAB — CBC WITH DIFFERENTIAL/PLATELET
Abs Immature Granulocytes: 0.04 K/uL (ref 0.00–0.07)
Basophils Absolute: 0 K/uL (ref 0.0–0.1)
Basophils Relative: 0 %
Eosinophils Absolute: 0.1 K/uL (ref 0.0–0.5)
Eosinophils Relative: 1 %
HCT: 42.5 % (ref 39.0–52.0)
Hemoglobin: 13 g/dL (ref 13.0–17.0)
Immature Granulocytes: 0 %
Lymphocytes Relative: 10 %
Lymphs Abs: 0.9 K/uL (ref 0.7–4.0)
MCH: 28.7 pg (ref 26.0–34.0)
MCHC: 30.6 g/dL (ref 30.0–36.0)
MCV: 93.8 fL (ref 80.0–100.0)
Monocytes Absolute: 0.8 K/uL (ref 0.1–1.0)
Monocytes Relative: 8 %
Neutro Abs: 7.4 K/uL (ref 1.7–7.7)
Neutrophils Relative %: 81 %
Platelets: 184 K/uL (ref 150–400)
RBC: 4.53 MIL/uL (ref 4.22–5.81)
RDW: 13.3 % (ref 11.5–15.5)
WBC: 9.2 K/uL (ref 4.0–10.5)
nRBC: 0 % (ref 0.0–0.2)

## 2024-02-08 LAB — URINALYSIS, ROUTINE W REFLEX MICROSCOPIC
Bilirubin Urine: NEGATIVE
Glucose, UA: NEGATIVE mg/dL
Ketones, ur: NEGATIVE mg/dL
Nitrite: NEGATIVE
Protein, ur: 30 mg/dL — AB
RBC / HPF: >50 RBC/hpf (ref 0–5)
Specific Gravity, Urine: 1.015 (ref 1.005–1.030)
WBC, UA: >50 WBC/hpf (ref 0–5)
pH: 5 (ref 5.0–8.0)

## 2024-02-08 NOTE — ED Triage Notes (Signed)
 Pt presents to the ED via POV with complaints of R ankle pain following a fall tonight. Pt states he got dizzy while ambulating and fell landing on his R ankle. Pt endorses some neck pain but denies hitting his head, not on thinners, no LOC. A&Ox4 at this time. Denies CP or SOB.

## 2024-02-09 MED ORDER — CEPHALEXIN 500 MG PO CAPS
500.0000 mg | ORAL_CAPSULE | Freq: Once | ORAL | Status: AC
  Administered 2024-02-09: 500 mg via ORAL
  Filled 2024-02-09: qty 1

## 2024-02-09 MED ORDER — CEPHALEXIN 500 MG PO CAPS: 500.0000 mg | ORAL_CAPSULE | Freq: Three times a day (TID) | ORAL | 0 refills | Status: DC

## 2024-02-09 NOTE — ED Notes (Signed)
Report received from Amanda RN

## 2024-02-09 NOTE — Discharge Instructions (Signed)
 Richard Stanley  Thank you for allowing us  to take care of you today.  You came to the Emergency Department today because [name] had an episode of his typical dizziness at home today, and had a fall and twisted his right ankle.  Here in the emergency department his labs did show that he has a urine infection that we are going to treat with a antibiotic called Keflex .  Often times urinary tract infections can make older adults more prone to falls.  His x-ray does not show any broken bones, no CT did not show any bleeding or broken bones in his neck.  You can use Tylenol , ibuprofen, rest, ice, elevation for his ankle.   To-Do: 1. Please follow-up with your primary doctor within 2 days / as soon as possible.   Please return to the Emergency Department or call 911 if you experience have worsening of your symptoms, or do not get better, chest pain, shortness of breath, severe or significantly worsening pain, high fever, severe confusion, pass out or have any reason to think that you need emergency medical care.   We hope you feel better soon.   Mitzie Later, MD Department of Emergency Medicine [facility]

## 2024-02-09 NOTE — ED Provider Notes (Signed)
 [facility] EMERGENCY DEPARTMENT AT [facility] Provider Note   CSN: [id number] Arrival date & time: 02/08/24  2016     History Chief Complaint  Patient presents with   Fall   Dizziness    HPI: Richard Stanley is a 88 y.o. male with history pertinent for atrial fibrillation not on anticoagulation, HTN, COPD not on oxygen, CKD, bladder cancer, DNR who presents complaining of fall. Patient arrived via POV accompanied by wife.  History provided by patient and spouse/partner.  No interpreter required during this encounter.  Wife at bed is primary historian.  She reports that she is a patient lives at home.  Reports that he has a history of advanced malignancy, and is currently DNR, and she and the remainder of his medical team have been working to de-escalate his care.  Reports that he ambulates with a walker at baseline, and has a history of chronic dizziness upon standing and with ambulation, with history of prior falls.  Reports that he got up to ambulate to the bathroom, and had a episode of his typical dizziness and fall.  Reports that he twisted his right ankle.  Reports that she was able to help him off the ground, and he was able to ambulate to the car with her with minimal assistance.  Reports that they are presenting to the emergency department for evaluation primarily of his ankle.  Wife feels that his dizziness is at baseline, and feels that further workup of this is not within their goals of care.  Patient reports that he feels at baseline, complains of mild right lateral ankle pain, denies chest pain, shortness of breath, nausea, vomiting, diarrhea.  Patient's recorded medical, surgical, social, medication list and allergies were reviewed in the Snapshot window as part of the initial history.   Prior to Admission medications   Medication Sig Start Date End Date Taking? Authorizing Provider  cephALEXin  (KEFLEX ) 500 MG capsule Take 1 capsule (500 mg total) by mouth 3  (three) times daily. 02/09/24  Yes Rogelia Jerilynn RAMAN, MD  amiodarone  (PACERONE ) 100 MG tablet Take 100 mg by mouth daily.    [provider]  FLUoxetine  (PROZAC ) 20 MG capsule Take 20 mg by mouth daily. 12/12/21   [provider]     Allergies: Patient has no known allergies.   Review of Systems   ROS as per HPI  Physical Exam Updated Vital Signs BP (!) 193/92   Pulse 64   Temp 98.5 F (36.9 C)   Resp 16   Ht 6' (1.829 m)   Wt 61.7 kg   SpO2 96%   BMI 18.44 kg/m  Physical Exam Vitals and nursing note reviewed.  Constitutional:      General: He is not in acute distress.    Appearance: He is well-developed.  HENT:     Head: Normocephalic and atraumatic.  Eyes:     Conjunctiva/sclera: Conjunctivae normal.  Cardiovascular:     Rate and Rhythm: Normal rate and regular rhythm.     Heart sounds: No murmur heard. Pulmonary:     Effort: Pulmonary effort is normal. No respiratory distress.     Breath sounds: Normal breath sounds.  Abdominal:     Palpations: Abdomen is soft.     Tenderness: There is no abdominal tenderness.  Musculoskeletal:        General: No swelling.     Cervical back: Neck supple. No bony tenderness.     Thoracic back: No bony tenderness.  Lumbar back: No bony tenderness.     Right ankle: Swelling (laterlal) and ecchymosis (taleral) present. Tenderness present over the ATF ligament. No lateral malleolus (No bony tenderness), medial malleolus, base of 5th metatarsal or proximal fibula tenderness. Anterior drawer test negative.     Left ankle: Normal.  Skin:    General: Skin is warm and dry.     Capillary Refill: Capillary refill takes less than 2 seconds.  Neurological:     Mental Status: He is alert.  Psychiatric:        Mood and Affect: Mood normal.     ED Course/ Medical Decision Making/ A&P    Procedures Procedures   Medications Ordered in ED Medications  cephALEXin  (KEFLEX ) capsule 500 mg (500 mg Oral Given 02/09/24 0019)     Medical Decision Making:   Richard Stanley is a 88 y.o. male who presents for dizziness, fall, right ankle pain as per above.  Physical exam is pertinent for ecchymosis and swelling to the right lateral malleolus with soft tissue tenderness to palpation without bony tenderness.   The differential includes but is not limited to ICH, fracture, dislocation, blunt trauma, UTI, dehydration, frailty, soft tissue injury.  Independent historian: Spouse/partner  External data reviewed: Labs: Reviewed prior labs for baseline  Initial Plan:   Screening labs including CBC and Metabolic panel to evaluate for infectious or metabolic etiology of disease.  Urinalysis with reflex culture ordered to evaluate for UTI or relevant urologic/nephrologic pathology.  CT head, CT C-spine and right ankle plain films to evaluate for acute traumatic pathology  Labs: Ordered, Independent interpretation, and Details: UA with evidence of UTI with present blood, bacteria, LE.  CBC without leukocytosis, anemia, thrombocytopenia.  CMP with elevation of BUN and creatinine at baseline for patient.  No emergent electrolyte derangement or LFT abnormality  Radiology: Ordered, Independent interpretation, Details: Ankle x-ray without displaced fracture or dislocation, there is notable soft tissue swelling.  CT head without ICH, displaced fracture, embolus, mass lesion, loss of gray/white matter differentiation.  CT C-spine without fracture, dislocation, traumatic malalignment, and All images reviewed independently.  Agree with radiology report at this time.   DG Ankle Right Port Result Date: 02/08/2024 CLINICAL DATA:  Recent fall with ankle pain, initial encounter EXAM: PORTABLE RIGHT ANKLE - 2 VIEW COMPARISON:  None Available. FINDINGS: Considerable lateral soft tissue swelling is noted. Healed distal fibular fracture is noted. Mild talotibial degenerative changes are seen. No acute fracture or dislocation is noted. IMPRESSION:  Lateral soft tissue swelling without acute bony abnormality. Electronically Signed   By: Oneil Devonshire M.D.   On: 02/08/2024 21:30   CT Head Wo Contrast Result Date: 02/08/2024 EXAM: CT HEAD AND CERVICAL SPINE 02/08/2024 08:41:00 PM TECHNIQUE: CT of the head and cervical spine was performed without the administration of intravenous contrast. Multiplanar reformatted images are provided for review. Automated exposure control, iterative reconstruction, and/or weight based adjustment of the mA/kV was utilized to reduce the radiation dose to as low as reasonably achievable. COMPARISON: 06/11/2023 CLINICAL HISTORY: Head trauma, minor (Age >= 65y). Per triage notes: Pt presents to the ED via POV with complaints of R ankle pain following a fall tonight. Pt states he got dizzy while ambulating and fell landing on his R ankle. Pt endorses some neck pain but denies hitting his head, not on thinners, ; no LOC. A\T\Ox4 at this time. Denies CP or SOB. FINDINGS: CT HEAD BRAIN AND VENTRICLES: No acute intracranial hemorrhage. No mass effect or midline shift. No  abnormal extra-axial fluid collection. No evidence of acute infarct. No hydrocephalus. Mild volume loss. Chronic ischemic white matter changes. ORBITS: No acute abnormality. SINUSES AND MASTOIDS: No acute abnormality. SOFT TISSUES AND SKULL: No acute skull fracture. No acute soft tissue abnormality. CT CERVICAL SPINE BONES AND ALIGNMENT: No acute fracture or traumatic malalignment. DEGENERATIVE CHANGES: No significant degenerative changes. SOFT TISSUES: No prevertebral soft tissue swelling. IMPRESSION: 1. No acute intracranial abnormality. 2. Mild volume loss and chronic ischemic white matter changes. Electronically signed by: Franky Stanford MD 02/08/2024 09:07 PM EDT RP Workstation: [id number]   CT Cervical Spine Wo Contrast Result Date: 02/08/2024 EXAM: CT HEAD AND CERVICAL SPINE 02/08/2024 08:41:00 PM TECHNIQUE: CT of the head and cervical spine was performed without  the administration of intravenous contrast. Multiplanar reformatted images are provided for review. Automated exposure control, iterative reconstruction, and/or weight based adjustment of the mA/kV was utilized to reduce the radiation dose to as low as reasonably achievable. COMPARISON: 06/11/2023 CLINICAL HISTORY: Head trauma, minor (Age >= 65y). Per triage notes: Pt presents to the ED via POV with complaints of R ankle pain following a fall tonight. Pt states he got dizzy while ambulating and fell landing on his R ankle. Pt endorses some neck pain but denies hitting his head, not on thinners, ; no LOC. A\T\Ox4 at this time. Denies CP or SOB. FINDINGS: CT HEAD BRAIN AND VENTRICLES: No acute intracranial hemorrhage. No mass effect or midline shift. No abnormal extra-axial fluid collection. No evidence of acute infarct. No hydrocephalus. Mild volume loss. Chronic ischemic white matter changes. ORBITS: No acute abnormality. SINUSES AND MASTOIDS: No acute abnormality. SOFT TISSUES AND SKULL: No acute skull fracture. No acute soft tissue abnormality. CT CERVICAL SPINE BONES AND ALIGNMENT: No acute fracture or traumatic malalignment. DEGENERATIVE CHANGES: No significant degenerative changes. SOFT TISSUES: No prevertebral soft tissue swelling. IMPRESSION: 1. No acute intracranial abnormality. 2. Mild volume loss and chronic ischemic white matter changes. 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  Patient overall well-appearing on exam, reportedly dizziness is a  chronic problem for the patient, patient reports that he feels at baseline, wife reports that in light of his chronic illnesses they are minimizing medical care, given this is a chronic problem for the patient and he feels as his usual self, I believe that it is reasonable to defer further workup for this at this time.  Screening labs were obtained and are concerning for UTI, patient is asymptomatic, however given age, history of urologic malignancy, will treat with course of Keflex , patient and wife are amenable to this plan.  Screening CT head and CT C-spine obtained given patient is at risk due to age per the [place] CT head and CT C-spine rule.  These do not demonstrate acute traumatic injuries.  X-ray also reveals soft tissue swelling of the right lateral malleolus without acute fracture or dislocation.  Given the patient is ambulatory, do not feel that patient requires further imaging or workup.  Offered walking boot, however patient and wife felt that this would  further impair his gait, and would like to defer further corrective devices such as braces or boots, I believe that this is reasonable.  Patient was able to ambulate in the ED with assistance of wife.  Additionally offered short course of opioids for breakthrough pain, however patient and wife also declined this at this time.  Patient thereafter discharged in stable condition.  Presentation is most consistent with acute complicated illness  Discussion of management or test interpretations with external provider(s): not indicated  Risk Drugs:Prescription drug management  Disposition: DISCHARGE: I believe that the patient is safe for discharge home with outpatient follow-up. Patient was informed of all pertinent physical exam, laboratory, and imaging findings. Patient's suspected etiology of their symptom presentation was discussed with the patient and all questions were answered. We discussed following up with PCP. I provided thorough ED  return precautions. The patient feels safe and comfortable with this plan.  MDM generated using voice dictation software and may contain dictation errors.  Please contact me for any clarification or with any questions.  Clinical Impression:  1. Fall, initial encounter   2. Cystitis   3. Sprain of anterior talofibular ligament of right ankle, initial encounter      Discharge   Final Clinical Impression(s) / ED Diagnoses Final diagnoses:  Fall, initial encounter  Cystitis  Sprain of anterior talofibular ligament of right ankle, initial encounter    Rx / DC Orders ED Discharge Orders          Ordered    cephALEXin  (KEFLEX ) 500 MG capsule  3 times daily        02/09/24 0010             Rogelia Jerilynn RAMAN, MD 02/10/24 [phone]

## 2024-03-17 ENCOUNTER — Emergency Department (HOSPITAL_COMMUNITY)

## 2024-03-17 ENCOUNTER — Other Ambulatory Visit: Payer: Self-pay

## 2024-03-17 ENCOUNTER — Inpatient Hospital Stay (HOSPITAL_COMMUNITY)

## 2024-03-17 ENCOUNTER — Inpatient Hospital Stay (HOSPITAL_COMMUNITY)
Admission: EM | Admit: 2024-03-17 | Discharge: 2024-04-09 | DRG: 682 | Disposition: E | Attending: Internal Medicine | Admitting: Internal Medicine

## 2024-03-17 ENCOUNTER — Encounter (HOSPITAL_COMMUNITY): Payer: Self-pay

## 2024-03-17 DIAGNOSIS — F0394 Unspecified dementia, unspecified severity, with anxiety: Secondary | ICD-10-CM | POA: Diagnosis present

## 2024-03-17 DIAGNOSIS — N179 Acute kidney failure, unspecified: Principal | ICD-10-CM | POA: Diagnosis present

## 2024-03-17 DIAGNOSIS — Z515 Encounter for palliative care: Secondary | ICD-10-CM | POA: Diagnosis not present

## 2024-03-17 DIAGNOSIS — R4182 Altered mental status, unspecified: Secondary | ICD-10-CM

## 2024-03-17 DIAGNOSIS — G9341 Metabolic encephalopathy: Secondary | ICD-10-CM | POA: Diagnosis present

## 2024-03-17 DIAGNOSIS — E872 Acidosis, unspecified: Secondary | ICD-10-CM | POA: Diagnosis present

## 2024-03-17 DIAGNOSIS — Z711 Person with feared health complaint in whom no diagnosis is made: Secondary | ICD-10-CM | POA: Diagnosis not present

## 2024-03-17 DIAGNOSIS — G253 Myoclonus: Secondary | ICD-10-CM | POA: Diagnosis present

## 2024-03-17 DIAGNOSIS — E785 Hyperlipidemia, unspecified: Secondary | ICD-10-CM | POA: Diagnosis present

## 2024-03-17 DIAGNOSIS — N183 Chronic kidney disease, stage 3 unspecified: Secondary | ICD-10-CM | POA: Diagnosis present

## 2024-03-17 DIAGNOSIS — D631 Anemia in chronic kidney disease: Secondary | ICD-10-CM | POA: Diagnosis present

## 2024-03-17 DIAGNOSIS — Z1152 Encounter for screening for COVID-19: Secondary | ICD-10-CM | POA: Diagnosis not present

## 2024-03-17 DIAGNOSIS — Z7189 Other specified counseling: Secondary | ICD-10-CM | POA: Diagnosis not present

## 2024-03-17 DIAGNOSIS — N320 Bladder-neck obstruction: Secondary | ICD-10-CM | POA: Diagnosis present

## 2024-03-17 DIAGNOSIS — N1832 Chronic kidney disease, stage 3b: Secondary | ICD-10-CM | POA: Diagnosis present

## 2024-03-17 DIAGNOSIS — L89222 Pressure ulcer of left hip, stage 2: Secondary | ICD-10-CM | POA: Diagnosis present

## 2024-03-17 DIAGNOSIS — I129 Hypertensive chronic kidney disease with stage 1 through stage 4 chronic kidney disease, or unspecified chronic kidney disease: Secondary | ICD-10-CM | POA: Diagnosis present

## 2024-03-17 DIAGNOSIS — R52 Pain, unspecified: Secondary | ICD-10-CM | POA: Diagnosis not present

## 2024-03-17 DIAGNOSIS — Z66 Do not resuscitate: Secondary | ICD-10-CM | POA: Diagnosis present

## 2024-03-17 DIAGNOSIS — N19 Unspecified kidney failure: Secondary | ICD-10-CM | POA: Diagnosis not present

## 2024-03-17 DIAGNOSIS — R4589 Other symptoms and signs involving emotional state: Secondary | ICD-10-CM

## 2024-03-17 DIAGNOSIS — N1330 Unspecified hydronephrosis: Secondary | ICD-10-CM | POA: Diagnosis present

## 2024-03-17 DIAGNOSIS — Z681 Body mass index (BMI) 19 or less, adult: Secondary | ICD-10-CM | POA: Diagnosis not present

## 2024-03-17 DIAGNOSIS — J4489 Other specified chronic obstructive pulmonary disease: Secondary | ICD-10-CM | POA: Diagnosis present

## 2024-03-17 DIAGNOSIS — I482 Chronic atrial fibrillation, unspecified: Secondary | ICD-10-CM | POA: Diagnosis present

## 2024-03-17 DIAGNOSIS — C679 Malignant neoplasm of bladder, unspecified: Secondary | ICD-10-CM | POA: Diagnosis present

## 2024-03-17 DIAGNOSIS — F0393 Unspecified dementia, unspecified severity, with mood disturbance: Secondary | ICD-10-CM | POA: Diagnosis present

## 2024-03-17 DIAGNOSIS — Z800 Family history of malignant neoplasm of digestive organs: Secondary | ICD-10-CM | POA: Diagnosis not present

## 2024-03-17 DIAGNOSIS — R531 Weakness: Secondary | ICD-10-CM | POA: Diagnosis present

## 2024-03-17 DIAGNOSIS — F32A Depression, unspecified: Secondary | ICD-10-CM | POA: Diagnosis present

## 2024-03-17 DIAGNOSIS — N400 Enlarged prostate without lower urinary tract symptoms: Secondary | ICD-10-CM | POA: Diagnosis present

## 2024-03-17 DIAGNOSIS — I10 Essential (primary) hypertension: Secondary | ICD-10-CM | POA: Diagnosis present

## 2024-03-17 DIAGNOSIS — Z87891 Personal history of nicotine dependence: Secondary | ICD-10-CM | POA: Diagnosis not present

## 2024-03-17 DIAGNOSIS — Z79899 Other long term (current) drug therapy: Secondary | ICD-10-CM | POA: Diagnosis not present

## 2024-03-17 DIAGNOSIS — H9190 Unspecified hearing loss, unspecified ear: Secondary | ICD-10-CM | POA: Diagnosis present

## 2024-03-17 DIAGNOSIS — R636 Underweight: Secondary | ICD-10-CM | POA: Diagnosis present

## 2024-03-17 DIAGNOSIS — E875 Hyperkalemia: Secondary | ICD-10-CM | POA: Diagnosis present

## 2024-03-17 DIAGNOSIS — J449 Chronic obstructive pulmonary disease, unspecified: Secondary | ICD-10-CM | POA: Diagnosis present

## 2024-03-17 LAB — URINALYSIS, W/ REFLEX TO CULTURE (INFECTION SUSPECTED)
Bilirubin Urine: NEGATIVE
Glucose, UA: 50 mg/dL — AB
Ketones, ur: NEGATIVE mg/dL
Nitrite: NEGATIVE
Protein, ur: >=300 mg/dL — AB
RBC / HPF: >50 RBC/hpf (ref 0–5)
Specific Gravity, Urine: 1.011 (ref 1.005–1.030)
WBC, UA: >50 WBC/hpf (ref 0–5)
pH: 8 (ref 5.0–8.0)

## 2024-03-17 LAB — BASIC METABOLIC PANEL WITH GFR
Anion gap: 23 — ABNORMAL HIGH (ref 5–15)
Anion gap: 18 — ABNORMAL HIGH (ref 5–15)
BUN: 137 mg/dL — ABNORMAL HIGH (ref 8–23)
BUN: 121 mg/dL — ABNORMAL HIGH (ref 8–23)
CO2: 13 mmol/L — ABNORMAL LOW (ref 22–32)
CO2: 25 mmol/L (ref 22–32)
Calcium: 8.5 mg/dL — ABNORMAL LOW (ref 8.9–10.3)
Calcium: 7.4 mg/dL — ABNORMAL LOW (ref 8.9–10.3)
Chloride: 96 mmol/L — ABNORMAL LOW (ref 98–111)
Chloride: 90 mmol/L — ABNORMAL LOW (ref 98–111)
Creatinine, Ser: 15.9 mg/dL — ABNORMAL HIGH (ref 0.61–1.24)
Creatinine, Ser: 13.9 mg/dL — ABNORMAL HIGH (ref 0.61–1.24)
GFR, Estimated: 3 mL/min — ABNORMAL LOW (ref >60)
GFR, Estimated: 3 mL/min — ABNORMAL LOW (ref >60)
Glucose, Bld: 106 mg/dL — ABNORMAL HIGH (ref 70–99)
Glucose, Bld: 467 mg/dL — ABNORMAL HIGH (ref 70–99)
Potassium: 6.1 mmol/L — ABNORMAL HIGH (ref 3.5–5.1)
Potassium: 4.9 mmol/L (ref 3.5–5.1)
Sodium: 131 mmol/L — ABNORMAL LOW (ref 135–145)
Sodium: 133 mmol/L — ABNORMAL LOW (ref 135–145)

## 2024-03-17 LAB — I-STAT CHEM 8, ED
BUN: >130 mg/dL — ABNORMAL HIGH (ref 8–23)
Calcium, Ion: 1.04 mmol/L — ABNORMAL LOW (ref 1.15–1.40)
Chloride: 103 mmol/L (ref 98–111)
Creatinine, Ser: 18 mg/dL — ABNORMAL HIGH (ref 0.61–1.24)
Glucose, Bld: 95 mg/dL (ref 70–99)
HCT: 31 % — ABNORMAL LOW (ref 39.0–52.0)
Hemoglobin: 10.5 g/dL — ABNORMAL LOW (ref 13.0–17.0)
Potassium: 6.3 mmol/L (ref 3.5–5.1)
Sodium: 130 mmol/L — ABNORMAL LOW (ref 135–145)
TCO2: 16 mmol/L — ABNORMAL LOW (ref 22–32)

## 2024-03-17 LAB — I-STAT CG4 LACTIC ACID, ED: Lactic Acid, Venous: 1.2 mmol/L (ref 0.5–1.9)

## 2024-03-17 LAB — CBC
HCT: 35.2 % — ABNORMAL LOW (ref 39.0–52.0)
Hemoglobin: 11.2 g/dL — ABNORMAL LOW (ref 13.0–17.0)
MCH: 28.4 pg (ref 26.0–34.0)
MCHC: 31.8 g/dL (ref 30.0–36.0)
MCV: 89.3 fL (ref 80.0–100.0)
Platelets: 208 K/uL (ref 150–400)
RBC: 3.94 MIL/uL — ABNORMAL LOW (ref 4.22–5.81)
RDW: 13.3 % (ref 11.5–15.5)
WBC: 7.3 K/uL (ref 4.0–10.5)
nRBC: 0 % (ref 0.0–0.2)

## 2024-03-17 LAB — RESP PANEL BY RT-PCR (RSV, FLU A&B, COVID)  RVPGX2
Influenza A by PCR: NEGATIVE
Influenza B by PCR: NEGATIVE
Resp Syncytial Virus by PCR: NEGATIVE
SARS Coronavirus 2 by RT PCR: NEGATIVE

## 2024-03-17 LAB — LACTIC ACID, PLASMA: Lactic Acid, Venous: 1.6 mmol/L (ref 0.5–1.9)

## 2024-03-17 LAB — URINE DRUG SCREEN
Amphetamines: NEGATIVE
Barbiturates: NEGATIVE
Benzodiazepines: NEGATIVE
Cocaine: POSITIVE — AB
Fentanyl: NEGATIVE
Methadone Scn, Ur: NEGATIVE
Opiates: NEGATIVE
Tetrahydrocannabinol: NEGATIVE

## 2024-03-17 LAB — TROPONIN T, HIGH SENSITIVITY
Troponin T High Sensitivity: 16 ng/L (ref 0–19)
Troponin T High Sensitivity: 19 ng/L (ref 0–19)

## 2024-03-17 LAB — GLUCOSE, CAPILLARY: Glucose-Capillary: 100 mg/dL — ABNORMAL HIGH (ref 70–99)

## 2024-03-17 MED ORDER — SODIUM ZIRCONIUM CYCLOSILICATE 5 G PO PACK
5.0000 g | PACK | Freq: Once | ORAL | Status: DC
  Administered 2024-03-17: 5 g via ORAL
  Filled 2024-03-17: qty 1

## 2024-03-17 MED ORDER — SODIUM BICARBONATE 8.4 % IV SOLN
INTRAVENOUS | Status: DC
  Filled 2024-03-17: qty 150

## 2024-03-17 MED ORDER — SODIUM CHLORIDE 0.9 % IV BOLUS
500.0000 mL | Freq: Once | INTRAVENOUS | Status: AC
  Administered 2024-03-17: 500 mL via INTRAVENOUS

## 2024-03-17 MED ORDER — ONDANSETRON HCL 4 MG PO TABS: 4.0000 mg | ORAL_TABLET | Freq: Four times a day (QID) | ORAL | Status: DC | PRN

## 2024-03-17 MED ORDER — CALCIUM GLUCONATE-NACL 1-0.675 GM/50ML-% IV SOLN
1.0000 g | Freq: Once | INTRAVENOUS | Status: AC
  Administered 2024-03-17: 1000 mg via INTRAVENOUS
  Filled 2024-03-17: qty 50

## 2024-03-17 MED ORDER — FUROSEMIDE 10 MG/ML IJ SOLN
40.0000 mg | Freq: Once | INTRAMUSCULAR | Status: DC
  Filled 2024-03-17: qty 4

## 2024-03-17 MED ORDER — SODIUM BICARBONATE 8.4 % IV SOLN
50.0000 meq | Freq: Once | INTRAVENOUS | Status: AC
  Administered 2024-03-17: 50 meq via INTRAVENOUS
  Filled 2024-03-17: qty 50

## 2024-03-17 MED ORDER — INSULIN ASPART 100 UNIT/ML IJ SOLN
5.0000 [IU] | Freq: Once | INTRAMUSCULAR | Status: AC
  Administered 2024-03-17: 5 [IU] via SUBCUTANEOUS
  Filled 2024-03-17: qty 0.05

## 2024-03-17 MED ORDER — SODIUM ZIRCONIUM CYCLOSILICATE 10 G PO PACK: 10.0000 g | PACK | Freq: Once | ORAL | Status: DC

## 2024-03-17 MED ORDER — DEXTROSE 50 % IV SOLN
25.0000 g | Freq: Once | INTRAVENOUS | Status: AC
  Administered 2024-03-17: 25 g via INTRAVENOUS
  Filled 2024-03-17: qty 50

## 2024-03-17 MED ORDER — SODIUM BICARBONATE 8.4 % IV SOLN
INTRAVENOUS | Status: DC
  Filled 2024-03-17: qty 150
  Filled 2024-03-17: qty 1000

## 2024-03-17 MED ORDER — ALBUTEROL SULFATE (2.5 MG/3ML) 0.083% IN NEBU
2.5000 mg | INHALATION_SOLUTION | Freq: Once | RESPIRATORY_TRACT | Status: AC
  Administered 2024-03-17: 2.5 mg via RESPIRATORY_TRACT
  Filled 2024-03-17: qty 3

## 2024-03-17 MED ORDER — ACETAMINOPHEN 650 MG RE SUPP: 650.0000 mg | Freq: Four times a day (QID) | RECTAL | Status: DC | PRN

## 2024-03-17 MED ORDER — ONDANSETRON HCL 4 MG/2ML IJ SOLN
4.0000 mg | Freq: Four times a day (QID) | INTRAMUSCULAR | Status: DC | PRN
  Administered 2024-03-19 – 2024-03-20 (×2): 4 mg via INTRAVENOUS
  Filled 2024-03-17 (×2): qty 2

## 2024-03-17 MED ORDER — HEPARIN SODIUM (PORCINE) 5000 UNIT/ML IJ SOLN
5000.0000 [IU] | Freq: Three times a day (TID) | INTRAMUSCULAR | Status: DC
  Administered 2024-03-17 – 2024-03-18 (×2): 5000 [IU] via SUBCUTANEOUS
  Filled 2024-03-17 (×2): qty 1

## 2024-03-17 MED ORDER — ACETAMINOPHEN 325 MG PO TABS
650.0000 mg | ORAL_TABLET | Freq: Four times a day (QID) | ORAL | Status: DC | PRN
  Administered 2024-03-19: 650 mg via ORAL
  Filled 2024-03-17: qty 2

## 2024-03-17 NOTE — H&P (Signed)
 History and Physical  Patient: Richard Stanley FMW:[id number] DOB: Sep 11, 1935 DOA: 03/17/2024 DOS: the patient was seen and examined on 03/17/2024 Patient coming from: Home  Chief Complaint:  Chief Complaint  Patient presents with   Weakness   HPI: Richard Stanley is a 88 y.o. male with PMH significant of atrial fibrillation, hypertension, CKD stage III, COPD, dementia, depression, hyperlipidemia presented to ED via EMS from home.  Currently no family member at the bedside.  Family had reported to the EMS that he was not acting his self and was confused.  Due to mental status, patient is not able to give good history.  He does reports that he had no nausea vomiting or any diarrhea.    On my exam he is oriented to self, able to tell that he is in [facility] and it is October.  Per patient, he at baseline ambulates with a walker.  Per family, patient was very weak and not able to stand up on his own this morning.  EMS was concerned about UTI. Patient was not able to tell if he had any difficulty urinating.  ED course:  In ED, temp 97.7 F, RR 18, pulse 70, BP 140/75, O2 sats 100% on room air Labs showed sodium 131, potassium 6.1, chloride 96, CO2 13, BUN 137, creatinine 15.9 Baseline creatinine 1.6 on 02/08/2024 Repeat i-STAT labs showed potassium worsened to 6.3, BUN> 130, creatinine 18.0 EDP consulted nephrology who recommended Foley catheter and CT scan for further workup.  Review of Systems: unable to review all systems due to the inability of the patient to answer questions. 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 His smoking use included cigarettes. He started smoking about 72 years ago. He has a 38 pack-year smoking history. He has never used smokeless tobacco. He reports that he does not currently use alcohol. He reports that he does not use drugs. No Known Allergies Family History  Problem Relation Age of Onset   Colon cancer Father    Cancer Father        colon   Other Sister        died after knee surgery   Prior to Admission medications   Medication Sig Start Date End Date Taking? Authorizing Provider  amiodarone  (PACERONE ) 100 MG tablet Take 100 mg by mouth daily.    [provider]  cephALEXin  (KEFLEX ) 500 MG capsule Take 1 capsule (500 mg total) by mouth 3 (three) times daily. 02/09/24   Rogelia Jerilynn RAMAN, MD  FLUoxetine  (PROZAC ) 20  MG capsule Take 20 mg by mouth daily. 12/12/21   [provider]   Physical Exam: Vitals:   03/17/24 1230 03/17/24 1235 03/17/24 1238  BP:  (!) 140/75 (!) 140/75  Pulse:  70 70  Resp:  16 18  Temp:  98.6 F (37 C) 97.7 F (36.5 C)  TempSrc:  Oral Oral  SpO2:  99% 100%  Weight: 62 kg    Height: 6' (1.829 m)       General: Alert, awake, oriented x to self and place, NAD,  not able to give good history Eyes: pink conjunctiva, anicteric sclera, PERLA HEENT: normocephalic, atraumatic, oropharynx clear Neck: supple, no masses or lymphadenopathy, no JVD CVS: Regular rate and rhythm, no murmurs, rubs or  gallops. Resp : Clear to auscultation bilaterally, no wheezing, rales or rhonchi. GI : Soft, nontender, nondistended, positive bowel sounds. No hepatomegaly.  Ext: No lower extremity edema, having jerking movements Musculoskeletal: No clubbing or cyanosis, positive pedal pulses. No contracture. ROM intact  Neuro: Grossly intact, no focal neurological deficits, strength 5/5 upper and lower extremities bilaterally Psych: Alert but somewhat confused Skin: no rashes or lesions, warm and dry   Data Reviewed: I have reviewed ED notes, Vitals, Lab results and outpatient records.   Recent Labs  Lab 03/17/24 1258 03/17/24 1448  NA 131* 130*  K 6.1* 6.3*  CL 96* 103  CO2 13*  --   GLUCOSE 106* 95  BUN 137* >130*  CREATININE 15.90* 18.00*  CALCIUM  8.5*  --    Recent Labs  Lab 03/17/24 1258 03/17/24 1448  WBC 7.3  --   HGB 11.2* 10.5*  HCT 35.2* 31.0*  MCV 89.3  --   PLT 208  --     Assessment and Plan Principal Problem:   AKI (acute kidney injury) superimposed on CKD stage IIIb - Unclear etiology of severe AKI, hyperkalemia, anion gap metabolic acidosis.  Patient unable to provide good history.  Baseline creatinine 1.6 on 02/08/2024 - Nephrology consulted, recommended CT abdomen pelvis without contrast for further evaluation, place Foley catheter - Obtain lactic acid, UA and culture, UDS, started on IV bicarb drip - Avoid nephrotoxic meds  Active Problems: Severe hyperkalemia -Placed on albuterol  nebs, IV calcium  gluconate, sodium bicarb, Lokelma, received insulin/D50 in ER - Will recheck K   Acute metabolic encephalopathy - Likely due to #1, will check UA and culture - CT head showed no acute intracranial abnormality  Atrial fibrillation, chronic - Had history of cardioversion on 11/12/2021, follows Dr. Ladona  - HR in 50s, hold amiodarone  - Not on any anticoagulation     COPD (chronic obstructive pulmonary disease) (HCC) -Currently no wheezing, patient receiving  albuterol  nebs for hyperkalemia     Essential hypertension -BP stable, not on any antihypertensives, await med rec  Anemia of chronic disease, normocytic - Baseline hemoglobin 13.0 on 02/08/2024, presented with hemoglobin of 11.2 - No active bleeding  History of aggressive urothelial carcinoma of bladder (HCC) - Follow CT abdomen, may need urology consult if acute obstructive uropathy     Advance Care Planning:   Code Status: Limited: Do not attempt resuscitation (DNR) -DNR-LIMITED -Do Not Intubate/DNI   Has advanced documents in EPIC  Consults: Nephrology Family Communication: No family member at the bedside Severity of Illness:      The appropriate patient status for this patient is INPATIENT. Inpatient status is judged to be reasonable and necessary in order to provide the required intensity of service to ensure the patient's safety. The  patient's presenting symptoms, physical exam findings, and initial radiographic and laboratory data in the context of their chronic comorbidities is felt to place them at high risk for further clinical deterioration. Furthermore, it is not anticipated that the patient will be medically stable for discharge from the hospital within 2 midnights of admission.   * I certify that at the point of admission it is my clinical judgment that the patient will require inpatient hospital care spanning beyond 2 midnights from the point of admission due to high intensity of service, high risk for further deterioration and high frequency of surveillance required.*    Author: Nydia Distance, MD 03/17/2024 2:59 PM For on call review [email or web address].

## 2024-03-17 NOTE — ED Notes (Signed)
 Wife of pt has arrived at bedside.

## 2024-03-17 NOTE — Consult Note (Signed)
 [facility] Renal Consultation Note  Requesting MD: Duwaine Fusi, DO Indication for Consultation:  AKI   Chief complaint: Altered mental status and weakness   HPI:  Richard Stanley is a 88 y.o. male with a charted history of dementia, CKD stage 3, Afib, and HTN who presented to the ER from home via EMS.  He has been altered per his family and weak, as well.  No trouble with breathing and no nausea.  He has been eating very little and drinking very little.  His daughter is concerned that he is dehydrated.  At baseline, he is usually able to ambulate with a walker but has had weakness over the past day to the point where he was unable to stand up on his own this morning.  His wife is interviewed at bedside.  She states that when she asked him his name this morning, he stated Richard Stanley and started singing.  EMS was concerned about a UTI and patient's wife states that on his last admission he had a UTI.  He was found to have marked acute kidney injury with a creatinine of 15 and potassium 6.1.  Nephrology is consulted for assistance with management of AKI.  CT noncontrast was ordered and I have requested Foley catheter and 1 amp of bicarbonate.  He was also ordered albuterol , insulin/dextrose , lokelma, and calcium .   He had a 500 mL bolus of NS earlier this afternoon.  Baseline creatinine is between 1.6 and 2.0 in the past year or so.  He is not followed by [facility].  CT head was obtained without acute intracranial process.    With regard to his memory, the patient's wife states that he does not have a formal diagnosis of dementia however he has been having trouble with his memory lately-at least the last 4 months.  He was diagnosed with bladder cancer and had surgery for that this year.  His wife states urology had recommended another procedure to check to see if they had gotten all the cancer and the patient declined the procedure.  In fact, she states that he would have stayed  home if he was more alert-he would not have wanted to come to the hospital.  She states that he would not want dialysis and that he has a DNR.  We discussed that if his renal failure did not improve with conservative measures that he would then need palliative care and she voiced understanding.    Creatinine, Ser  Date/Time Value Ref Range Status  03/17/2024 12:58 PM 15.90 (H) 0.61 - 1.24 mg/dL Final  90/98/7974 90:75 PM 1.63 (H) 0.61 - 1.24 mg/dL Final  98/68/7974 94:47 PM 1.82 (H) 0.61 - 1.24 mg/dL Final  98/97/7974 93:47 AM 1.98 (H) 0.61 - 1.24 mg/dL Final  89/79/7975 95:73 AM 1.65 (H) 0.61 - 1.24 mg/dL Final  89/88/7975 95:93 PM 1.60 (H) 0.61 - 1.24 mg/dL Final  89/88/7975 96:42 PM 1.58 (H) 0.61 - 1.24 mg/dL Final  91/96/7975 98:79 PM 1.81 (H) 0.61 - 1.24 mg/dL Final  92/85/7975 92:59 PM 2.00 (H) 0.61 - 1.24 mg/dL Final  92/85/7975 92:63 PM 1.87 (H) 0.61 - 1.24 mg/dL Final  90/72/7976 94:54 AM 1.21 0.61 - 1.24 mg/dL Final  90/73/7976 98:44 AM 1.32 (H) 0.61 - 1.24 mg/dL Final  90/74/7976 98:88 AM 1.71 (H) 0.61 - 1.24 mg/dL Final  90/75/7976 94:90 AM 1.94 (H) 0.61 - 1.24 mg/dL Final  93/72/7976 97:80 PM 1.86 (H) 0.61 - 1.24 mg/dL Final  93/93/7976 89:91 AM  1.80 (H) 0.61 - 1.24 mg/dL Final  90/83/7980 96:69 PM 1.64 (H) 0.61 - 1.24 mg/dL Final  96/88/7980 90:59 AM 1.77 (H) 0.61 - 1.24 mg/dL Final  96/89/7980 88:61 PM 1.84 (H) 0.61 - 1.24 mg/dL Final  96/79/7986 89:49 AM 1.5 0.4 - 1.5 mg/dL Final  87/93/7987 95:41 PM 1.6 (H) 0.4 - 1.5 mg/dL Final  94/75/7987 96:41 PM 1.5 0.4 - 1.5 mg/dL Final  87/94/7988 90:46 AM 1.5 0.4 - 1.5 mg/dL Final  90/83/7988 90:54 AM 1.4 0.4 - 1.5 mg/dL Final  98/89/7988 98:52 PM 1.4 0.4 - 1.5 mg/dL Final     PMHx:   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   CALCU GALLBLADD&BD W/O CHOLCYST W/O  MENTION OBST 06/28/2010   CKD (chronic kidney disease), stage III (HCC)    COPD (chronic obstructive pulmonary disease) (HCC)    Dementia (HCC)    DEPRESSION 06/22/2009   ERECTILE DYSFUNCTION, 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   Headache    HYPERLIPIDEMIA 06/18/2009   Nodule of right lung    Other diseases of lung, not elsewhere classified 06/18/2009   Rash 10/01/2010   Thyroid  nodule 10/01/2010    Past Surgical History:  Procedure Laterality Date   CARDIAC CATHETERIZATION  1998   normal per pt   CARDIOVERSION N/A 11/12/2021   Procedure: CARDIOVERSION;  Surgeon: Ladona Heinz, MD;  Location[facility] ENDOSCOPY;  Service: Cardiovascular;  Laterality: N/A;    Family Hx:  Family History  Problem Relation Age of Onset   Colon cancer Father    Cancer Father        colon   Other Sister        died after knee surgery  No family history of CKD   Social History:  reports that he quit smoking about 34 years ago. His smoking use included cigarettes. He started smoking about 72 years ago. He has a 38 pack-year smoking history. He has never used smokeless tobacco. He reports that he does not currently use alcohol. He reports that he does not use drugs.  Allergies: No Known Allergies  Medications: Prior to Admission medications   Medication Sig Start Date End Date Taking? Authorizing Provider  amiodarone  (PACERONE ) 100 MG tablet Take 100 mg by mouth daily.    [provider]  cephALEXin  (KEFLEX ) 500 MG capsule Take 1 capsule (500 mg total) by mouth 3 (three) times daily. 02/09/24   Rogelia Jerilynn RAMAN, MD  FLUoxetine  (PROZAC ) 20 MG capsule Take 20 mg by mouth daily. 12/12/21   [provider]   I have reviewed the patient's current and reported prior to admission medications.   Labs:     Latest Ref Rng & Units 03/17/2024   12:58 PM 02/08/2024    9:24 PM 07/10/2023    5:52 PM  BMP  Glucose 70 - 99 mg/dL 893  894  886   BUN 8 - 23 mg/dL 862  28  28   Creatinine 0.61 - 1.24 mg/dL 84.09  8.36   8.17   Sodium 135 - 145 mmol/L 131  138  138   Potassium 3.5 - 5.1 mmol/L 6.1  4.7  4.4   Chloride 98 - 111 mmol/L 96  103  100   CO2 22 - 32 mmol/L 13  26  26    Calcium  8.9 - 10.3 mg/dL 8.5  9.0  9.0     Urinalysis    Component Value Date/Time   COLORURINE YELLOW 02/08/2024 2303   APPEARANCEUR CLOUDY (A) 02/08/2024 2303   APPEARANCEUR Clear 09/19/2021 1337   LABSPEC 1.015 02/08/2024 2303   PHURINE 5.0 02/08/2024 2303   GLUCOSEU NEGATIVE 02/08/2024 2303   GLUCOSEU NEGATIVE 06/18/2009 1347   HGBUR LARGE (A) 02/08/2024 2303   BILIRUBINUR NEGATIVE 02/08/2024 2303   BILIRUBINUR Negative 09/19/2021 1337   KETONESUR NEGATIVE 02/08/2024 2303   PROTEINUR 30 (A) 02/08/2024 2303   UROBILINOGEN 0.2 06/18/2009 1347   NITRITE NEGATIVE 02/08/2024 2303   LEUKOCYTESUR MODERATE (A) 02/08/2024 2303     ROS: Unable to obtain secondary to AMS   Physical Exam: Vitals:   03/17/24 1235 03/17/24 1238  BP: (!) 140/75 (!) 140/75  Pulse: 70 70  Resp: 16 18  Temp: 98.6 F (37 C) 97.7 F (36.5 C)  SpO2: 99% 100%     General: Elderly male in stretcher in no acute distress at rest, frail and chronically ill-appearing HEENT: Normocephalic atraumatic Eyes: Extraocular movements intact sclera anicteric Neck: Supple trachea midline Heart: S1-S2 no rub appreciated Lungs: Clear to auscultation bilaterally normal work of breathing at rest on room air Abdomen: Soft nondistended nontender, thin Extremities: No edema appreciated no cyanosis or clubbing Skin: No rash on extremities exposed Psych: no anxiety or agitation Neuro: Awake on arrival and oriented to person but not to year or situation GU Foley catheter was placed by nursing during my exam    Assessment/Plan:  # Acute kidney injury - Perhaps prolonged pre-renal insults however with bladder procedure recently must also consider possible obstruction - He is not a candidate for dialysis and has expressed a past preference for conservative  measures.  His wife reiterates this preference today   - I have discontinued the order for Lasix - Place foley catheter - appreciate nursing  - Obtain CT a/p without contrast  - Starting bicarb gtt as below - Should his renal failure not improve with conservative measures would recommend inpatient palliative care consult and transition to comfort focused care  # Hyperkalemia - Temporizing measures as above-bicarbonate, saline, insulin/glucose, and calcium , and Lokelma.  Some just completed in the room during my exam - Update BMP at 6 PM to guide   # Metabolic acidosis - Start bicarb gtt  # Altered mental status - Note charted diagnosis of dementia.  I am unclear as to what his baseline is - Certainly his AKI could be contributing  # HTN  - Acceptable on current regimen   # CKD stage 3b - Note baseline Cr 1.6 - 2.0  Disposition - inpatient monitoring.  To clarify, he is acceptable for admission to [facility]    Katheryn JAYSON Saba 03/17/2024, 3:50 PM

## 2024-03-17 NOTE — ED Triage Notes (Addendum)
 Patient BIB [facility] from home. Beginning last night has had generalized weakness. This morning patient could not stand up on his own and is only alert and oriented to self. Family said patient is not acting per baseline. Usually is able to ambulate with a walker but today could not. EMS reports patient smells of a UTI.

## 2024-03-17 NOTE — ED Provider Notes (Signed)
 [facility] AT [facility] Provider Note   CSN: [id number] Arrival date & time: 03/17/24  1224     Patient presents with: Weakness   Richard Stanley is a 88 y.o. male.   88 year old male brought in by EMS for evaluation of altered mental status.  Per EMS family states the patient was not himself today.  EMS states that the patient smelled like a UTI.  Patient is alert and oriented to self, and is confused about the month but he does answer questions appropriately.  He appears fatigued but has no other complaints at this time.   Weakness      Prior to Admission medications   Medication Sig Start Date End Date Taking? Authorizing Provider  amiodarone  (PACERONE ) 100 MG tablet Take 100 mg by mouth daily.    [provider]  cephALEXin  (KEFLEX ) 500 MG capsule Take 1 capsule (500 mg total) by mouth 3 (three) times daily. 02/09/24   Rogelia Jerilynn RAMAN, MD  FLUoxetine  (PROZAC ) 20 MG capsule Take 20 mg by mouth daily. 12/12/21   [provider]    Allergies: Patient has no known allergies.    Review of Systems  Reason unable to perform ROS: altered mental status.  Neurological:  Positive for weakness.    Updated Vital Signs BP (!) 149/66 (BP Location: Right Arm)   Pulse 65   Temp 97.7 F (36.5 C) (Oral)   Resp 17   Ht 6' (1.829 m)   Wt 62 kg   SpO2 99%   BMI 18.54 kg/m   Physical Exam Vitals and nursing note reviewed.  Constitutional:      General: He is not in acute distress.    Appearance: Normal appearance. He is well-developed. He is ill-appearing.     Comments: Chronically ill appearing  HENT:     Head: Normocephalic and atraumatic.  Eyes:     Conjunctiva/sclera: Conjunctivae normal.  Cardiovascular:     Rate and Rhythm: Normal rate and regular rhythm.     Heart sounds: No murmur heard. Pulmonary:     Effort: Pulmonary effort is normal. No respiratory distress.     Breath sounds: Normal breath sounds.   Abdominal:     Palpations: Abdomen is soft.     Tenderness: There is no abdominal tenderness.  Musculoskeletal:        General: No swelling.     Cervical back: Neck supple.  Skin:    General: Skin is warm and dry.     Capillary Refill: Capillary refill takes less than 2 seconds.  Neurological:     Mental Status: He is alert.  Psychiatric:        Mood and Affect: Mood normal.     (all labs ordered are listed, but only abnormal results are displayed) Labs Reviewed  CBC - Abnormal; Notable for the following components:      Result Value   RBC 3.94 (*)    Hemoglobin 11.2 (*)    HCT 35.2 (*)    All other components within normal limits  BASIC METABOLIC PANEL WITH GFR - Abnormal; Notable for the following components:   Sodium 131 (*)    Potassium 6.1 (*)    Chloride 96 (*)    CO2 13 (*)    Glucose, Bld 106 (*)    BUN 137 (*)    Creatinine, Ser 15.90 (*)    Calcium  8.5 (*)    GFR, Estimated 3 (*)    Anion gap 23 (*)  All other components within normal limits  I-STAT CHEM 8, ED - Abnormal; Notable for the following components:   Sodium 130 (*)    Potassium 6.3 (*)    BUN >130 (*)    Creatinine, Ser 18.00 (*)    Calcium , Ion 1.04 (*)    TCO2 16 (*)    Hemoglobin 10.5 (*)    HCT 31.0 (*)    All other components within normal limits  RESP PANEL BY RT-PCR (RSV, FLU A&B, COVID)  [id number]  URINALYSIS, ROUTINE W REFLEX MICROSCOPIC  BASIC METABOLIC PANEL WITH GFR  URINALYSIS, W/ REFLEX TO CULTURE (INFECTION SUSPECTED)  LACTIC ACID, PLASMA  LACTIC ACID, PLASMA  URINE DRUG SCREEN  I-STAT CG4 LACTIC ACID, ED  I-STAT CG4 LACTIC ACID, ED  TROPONIN T, HIGH SENSITIVITY  TROPONIN T, HIGH SENSITIVITY    EKG: None  Radiology: CT Head Wo Contrast Result Date: 03/17/2024 CLINICAL DATA:  Altered mental status.  Weakness. EXAM: CT HEAD WITHOUT CONTRAST TECHNIQUE: Contiguous axial images were obtained from the base of the skull through the vertex without intravenous contrast.  RADIATION DOSE REDUCTION: This exam was performed according to the departmental dose-optimization program which includes automated exposure control, adjustment of the mA and/or kV according to patient size and/or use of iterative reconstruction technique. COMPARISON:  02/08/2024 FINDINGS: Brain: No intracranial hemorrhage, mass effect, or midline shift. Stable atrophy and chronic small vessel ischemia. No hydrocephalus. The basilar cisterns are patent. No evidence of territorial infarct or acute ischemia. No extra-axial or intracranial fluid collection. Vascular: Atherosclerosis of skullbase vasculature without hyperdense vessel or abnormal calcification. Skull: No fracture or focal lesion. Sinuses/Orbits: No acute finding. Other: None. IMPRESSION: 1. No acute intracranial abnormality. 2. Stable atrophy and chronic small vessel ischemia. Electronically Signed   By: Andrea Gasman M.D.   On: 03/17/2024 14:30   DG Chest 1 View Result Date: 03/17/2024 CLINICAL DATA:  ams Patient BIB [facility] from home. Beginning last night has had generalized weakness. EXAM: CHEST  1 VIEW COMPARISON:  Chest XR, 06/11/2023.  CT chest, 03/20/2023. FINDINGS: Cardiac silhouette is within normal limits. Aortic arch atherosclerosis. Lungs are well inflated. No focal consolidation or mass. No pleural effusion or pneumothorax. Chronic-appearing RIGHT distal clavicular and multilevel RIGHT posterior rib fracture abnormalities. No acute displaced fracture. IMPRESSION: No acute cardiopulmonary process. Electronically Signed   By: Thom Hall M.D.   On: 03/17/2024 14:11     Procedures   Medications Ordered in the ED  calcium  gluconate 1 g/ 50 mL sodium chloride  IVPB (1,000 mg Intravenous New Bag/Given 03/17/24 1453)  sodium zirconium cyclosilicate (LOKELMA) packet 10 g ( Oral Canceled Entry 03/17/24 1522)  sodium bicarbonate 150 mEq in dextrose  5 % 1,150 mL infusion ( Intravenous New Bag/Given 03/17/24 1526)  sodium chloride  0.9 % bolus  500 mL (0 mLs Intravenous Stopped 03/17/24 1400)  sodium bicarbonate injection 50 mEq (50 mEq Intravenous Given 03/17/24 1501)  albuterol  (PROVENTIL ) (2.5 MG/3ML) 0.083% nebulizer solution 2.5 mg (2.5 mg Nebulization Given 03/17/24 1459)  insulin aspart (novoLOG) injection 5 Units (5 Units Subcutaneous Given 03/17/24 1504)  dextrose  50 % solution 25 g (25 g Intravenous Given 03/17/24 1505)                                    Medical Decision Making Cardiac monitor interpretation: Sinus rhythm, no ectopy  Social determinants of health: History of dementia  Patient has been altered mental status  with stable vitals.  He is fatigued does wake up and answer some questions appropriately although he is confused.  Found to be in acute renal failure with acute hyperkalemia.  This admission, baseline creatinine is 1-2.  Was treated with multiple medications for this including fluids Lasix, bicarb calcium  gluconate albuterol .  I spoke with Dr. Jerrye, nephrology who recommended CT abdomen pelvis without contrast and admission for further workup and management.  Also recommended Foley catheter.  This was placed per nursing staff.  I spoke with Dr. Davia, hospitalist and patient will be admitted for further workup and management.  Patient is agreeable with the plan.  Vitals remained stable throughout stay in emergency department.  Problems Addressed: Acute renal failure, unspecified acute renal failure type: acute illness or injury that poses a threat to life or bodily functions Altered mental status, unspecified altered mental status type: acute illness or injury that poses a threat to life or bodily functions Hyperkalemia: acute illness or injury that poses a threat to life or bodily functions  Amount and/or Complexity of Data Reviewed Independent Historian: EMS    Details: EMS help provide history as patient is altered.  States he was found by his family to be more confused than normal External Data Reviewed:  notes.    Details: Prior ED records reviewed patient was seen in September in the ER for a fall  Labs: ordered. Decision-making details documented in ED Course.    Details: Ordered and reviewed by me shows evidence of hyperkalemia and acute renal failure Radiology: ordered and independent interpretation performed. Decision-making details documented in ED Course.    Details: Ordered and interpreted me independently of radiology Chest x-ray: Shows no acute abnormality in the chest  CT head: Shows no acute abnormality  CT abdomen pelvis is pending at this time ECG/medicine tests: ordered and independent interpretation performed. Decision-making details documented in ED Course. Discussion of management or test interpretation with external provider(s): Dr. Ozzie hide I spoke with her on the phone regarding patient's case and she will plan to consult on patient, recommended hyper-K treatment and CT abdomen/pelvis.  Dr. Davia -hospitalist-she will with the patient for further workup and management  Risk OTC drugs. Prescription drug management. Drug therapy requiring intensive monitoring for toxicity. Decision regarding hospitalization. Diagnosis or treatment significantly limited by social determinants of health. Risk Details: CRITICAL CARE Performed by: Duwaine LITTIE Fusi   Total critical care time: 45 minutes  Critical care time was exclusive of separately billable procedures and treating other patients.  Critical care was necessary to treat or prevent imminent or life-threatening deterioration.  Critical care was time spent personally by me on the following activities: development of treatment plan with patient and/or surrogate as well as nursing, discussions with consultants, evaluation of patient's response to treatment, examination of patient, obtaining history from patient or surrogate, ordering and performing treatments and interventions, ordering and review of laboratory  studies, ordering and review of radiographic studies, pulse oximetry and re-evaluation of patient's condition.   Critical Care Total time providing critical care: 45 minutes     Final diagnoses:  Acute renal failure, unspecified acute renal failure type  Altered mental status, unspecified altered mental status type  Hyperkalemia    ED Discharge Orders     None          Fusi Duwaine LITTIE, DO 03/17/24 1552

## 2024-03-18 DIAGNOSIS — R4182 Altered mental status, unspecified: Secondary | ICD-10-CM | POA: Diagnosis not present

## 2024-03-18 DIAGNOSIS — R52 Pain, unspecified: Secondary | ICD-10-CM

## 2024-03-18 DIAGNOSIS — Z79899 Other long term (current) drug therapy: Secondary | ICD-10-CM | POA: Diagnosis not present

## 2024-03-18 DIAGNOSIS — E875 Hyperkalemia: Secondary | ICD-10-CM

## 2024-03-18 DIAGNOSIS — Z711 Person with feared health complaint in whom no diagnosis is made: Secondary | ICD-10-CM

## 2024-03-18 DIAGNOSIS — C679 Malignant neoplasm of bladder, unspecified: Secondary | ICD-10-CM

## 2024-03-18 DIAGNOSIS — Z66 Do not resuscitate: Secondary | ICD-10-CM

## 2024-03-18 DIAGNOSIS — N179 Acute kidney failure, unspecified: Secondary | ICD-10-CM | POA: Diagnosis not present

## 2024-03-18 DIAGNOSIS — G253 Myoclonus: Secondary | ICD-10-CM

## 2024-03-18 DIAGNOSIS — Z515 Encounter for palliative care: Secondary | ICD-10-CM

## 2024-03-18 DIAGNOSIS — N19 Unspecified kidney failure: Secondary | ICD-10-CM | POA: Diagnosis not present

## 2024-03-18 DIAGNOSIS — Z7189 Other specified counseling: Secondary | ICD-10-CM

## 2024-03-18 LAB — BASIC METABOLIC PANEL WITH GFR
Anion gap: 19 — ABNORMAL HIGH (ref 5–15)
BUN: 137 mg/dL — ABNORMAL HIGH (ref 8–23)
CO2: 21 mmol/L — ABNORMAL LOW (ref 22–32)
Calcium: 8.3 mg/dL — ABNORMAL LOW (ref 8.9–10.3)
Chloride: 94 mmol/L — ABNORMAL LOW (ref 98–111)
Creatinine, Ser: 16.3 mg/dL — ABNORMAL HIGH (ref 0.61–1.24)
GFR, Estimated: 3 mL/min — ABNORMAL LOW (ref >60)
Glucose, Bld: 84 mg/dL (ref 70–99)
Potassium: 5.7 mmol/L — ABNORMAL HIGH (ref 3.5–5.1)
Sodium: 133 mmol/L — ABNORMAL LOW (ref 135–145)

## 2024-03-18 LAB — CBC
HCT: 31.2 % — ABNORMAL LOW (ref 39.0–52.0)
Hemoglobin: 10.3 g/dL — ABNORMAL LOW (ref 13.0–17.0)
MCH: 28.8 pg (ref 26.0–34.0)
MCHC: 33 g/dL (ref 30.0–36.0)
MCV: 87.2 fL (ref 80.0–100.0)
Platelets: 184 K/uL (ref 150–400)
RBC: 3.58 MIL/uL — ABNORMAL LOW (ref 4.22–5.81)
RDW: 13.5 % (ref 11.5–15.5)
WBC: 8.8 K/uL (ref 4.0–10.5)
nRBC: 0 % (ref 0.0–0.2)

## 2024-03-18 MED ORDER — AMLODIPINE BESYLATE 10 MG PO TABS
5.0000 mg | ORAL_TABLET | Freq: Every day | ORAL | Status: DC
Start: 2024-03-18 — End: 2024-03-18
  Administered 2024-03-18: 5 mg via ORAL
  Filled 2024-03-18: qty 1

## 2024-03-18 MED ORDER — CHLORHEXIDINE GLUCONATE CLOTH 2 % EX PADS
6.0000 | MEDICATED_PAD | Freq: Every day | CUTANEOUS | Status: DC
  Administered 2024-03-18: 6 via TOPICAL

## 2024-03-18 MED ORDER — GLYCOPYRROLATE 1 MG PO TABS: 1.0000 mg | ORAL_TABLET | ORAL | Status: DC | PRN

## 2024-03-18 MED ORDER — TAMSULOSIN HCL 0.4 MG PO CAPS
0.4000 mg | ORAL_CAPSULE | Freq: Every day | ORAL | Status: DC
  Administered 2024-03-18: 0.4 mg via ORAL
  Filled 2024-03-18: qty 1

## 2024-03-18 MED ORDER — LORAZEPAM 2 MG/ML PO CONC: 1.0000 mg | ORAL | Status: DC | PRN

## 2024-03-18 MED ORDER — POLYVINYL ALCOHOL 1.4 % OP SOLN
1.0000 [drp] | Freq: Four times a day (QID) | OPHTHALMIC | Status: DC | PRN
  Filled 2024-03-18: qty 15

## 2024-03-18 MED ORDER — LORAZEPAM 2 MG/ML IJ SOLN
1.0000 mg | INTRAMUSCULAR | Status: DC | PRN
  Administered 2024-03-25 (×3): 1 mg via INTRAVENOUS
  Filled 2024-03-18 (×4): qty 1

## 2024-03-18 MED ORDER — GLYCOPYRROLATE 0.2 MG/ML IJ SOLN: 0.2000 mg | INTRAMUSCULAR | Status: DC | PRN

## 2024-03-18 MED ORDER — LORAZEPAM 1 MG PO TABS: 1.0000 mg | ORAL_TABLET | ORAL | Status: DC | PRN

## 2024-03-18 MED ORDER — BIOTENE DRY MOUTH MT LIQD: 15.0000 mL | OROMUCOSAL | Status: DC | PRN

## 2024-03-18 MED ORDER — HYDRALAZINE HCL 20 MG/ML IJ SOLN: 10.0000 mg | Freq: Four times a day (QID) | INTRAMUSCULAR | Status: DC | PRN

## 2024-03-18 MED ORDER — LOPERAMIDE HCL 2 MG PO CAPS
4.0000 mg | ORAL_CAPSULE | Freq: Once | ORAL | Status: AC
  Administered 2024-03-18: 4 mg via ORAL
  Filled 2024-03-18: qty 2

## 2024-03-18 MED ORDER — SODIUM CHLORIDE 0.9 % IV SOLN
1.0000 g | INTRAVENOUS | Status: DC
  Administered 2024-03-18: 1 g via INTRAVENOUS
  Filled 2024-03-18: qty 10

## 2024-03-18 MED ORDER — HYDROMORPHONE HCL 1 MG/ML IJ SOLN
0.5000 mg | INTRAMUSCULAR | Status: DC | PRN
  Administered 2024-03-19: 1 mg via INTRAVENOUS
  Administered 2024-03-19: 2 mg via INTRAVENOUS
  Administered 2024-03-19: 1 mg via INTRAVENOUS
  Administered 2024-03-22: 0.5 mg via INTRAVENOUS
  Administered 2024-03-22 – 2024-03-23 (×4): 2 mg via INTRAVENOUS
  Administered 2024-03-23: 1 mg via INTRAVENOUS
  Administered 2024-03-23: 2 mg via INTRAVENOUS
  Administered 2024-03-24 (×2): 1 mg via INTRAVENOUS
  Administered 2024-03-24: 2 mg via INTRAVENOUS
  Administered 2024-03-24 – 2024-03-26 (×3): 1 mg via INTRAVENOUS
  Filled 2024-03-18 (×4): qty 1
  Filled 2024-03-18 (×6): qty 2
  Filled 2024-03-18: qty 1
  Filled 2024-03-18 (×3): qty 2
  Filled 2024-03-18: qty 1
  Filled 2024-03-18: qty 2
  Filled 2024-03-18 (×2): qty 1

## 2024-03-18 MED ORDER — SODIUM ZIRCONIUM CYCLOSILICATE 10 G PO PACK
10.0000 g | PACK | Freq: Once | ORAL | Status: AC
  Administered 2024-03-18: 10 g via ORAL
  Filled 2024-03-18: qty 1

## 2024-03-18 NOTE — Progress Notes (Signed)
 OT Cancellation Note  Patient Details Name: Richard Stanley MRN: [id number] DOB: 1935-09-06   Cancelled Treatment:    Reason Eval/Treat Not Completed: Patient not medically ready. Per RN, pt with AMS and confusion. Pt seen for PT eval, noted to be extremely tremulous, unable to tolerate sitting EOB with neurology consulted. OT will hold eval at this time and continue to follow.  Dmari Schubring L. Ludivina Guymon, OTR/L  03/18/24, 11:40 AM

## 2024-03-18 NOTE — Consult Note (Signed)
 Urology Consult Note   Requesting Attending Physician:  Richard Richard POUR, Stanley Service Providing Consult: Urology  Consulting Attending: Dr. Devere  Reason for Consult:  bladder outlet obstruction 2/2 known bladder cancer  HPI: Richard Stanley is seen in consultation for reasons noted above at the request of Richard, Richard POUR, Stanley. Patient is a 88 y.o. male presenting to Richard Stanley via EMS out of concern for AMS and generalized weakness.  PMH significant for CKDIII, A-fib, dementia, HTN, and bladder cancer.  Family reports difficulty breathing with very little oral intake.  Patient was found to have marked AKI with a serum creatinine of 15 and a potassium of 6.1, presumed to be 2/2 bladder outlet obstruction in the context of known bladder cancer.  Patient is known to our practice and was seen by Dr. Shane.  He underwent TURBT with Dr. Shane in March of this year.  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  Rebiopsy was recommended at that time, intravesical treatment, as well as radical cystectomy were discussed and all declined by patient and his wife.  They were electing to limiting comorbidity and focusing on quality of life measures with his advanced age.  They did agree to speak with an oncologist with consideration of repeat TURBT in 3 months.  They did not show to the oncology appointment or follow-up with our clinic.   On my arrival patient was sleeping and difficult to awaken.  He was only oriented to self at that time with significant myoclonic twitching.  Wife reports this has been happening for approximately 72 hours, presumably secondary to his metabolic derangements.  I let him rest and contacted the family by phone. ------------------  Assessment:   88 y.o. 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  They have declined to seek any other interventions for his known bladder cancer and expressed understanding that this was likely the cause of his bladder outlet obstruction.  They still decline surgery.  We discussed long-term catheterization, though this may bring its own set of challenges with his dementia.  His significant AKI has worsened today, with a serum creatinine now of 16.3.  They are steadfast that they do not want dialysis, reasonable decision considering his age.  I reiterated that if his renal function did not improve, that this would be terminal, and they expressed understanding.  Maintain Foley catheter for now.  Urology will follow peripherally while monitoring his renal function.  I will notify Dr. Shane of his admission.  Case and plan discussed with Richard Stanley.  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   Headache    HYPERLIPIDEMIA 06/18/2009   Nodule of right lung    Other diseases of lung, not elsewhere classified 06/18/2009   Rash 10/01/2010   Thyroid  nodule 10/01/2010    Past Surgical History:  Past  Surgical History:  Procedure Laterality Date   CARDIAC CATHETERIZATION  1998   normal per pt   CARDIOVERSION N/A 11/12/2021   Procedure: CARDIOVERSION;  Surgeon: Richard Heinz, Stanley;  Location: [facility] ENDOSCOPY;  Service: Cardiovascular;  Laterality: N/A;    Medication: Current Facility-Administered Medications  Medication Dose Route  Frequency Provider Last Rate Last Admin   acetaminophen  (TYLENOL ) tablet 650 mg  650 mg Oral Q6H PRN Richard, Richard Stanley       Or   acetaminophen  (TYLENOL ) suppository 650 mg  650 mg Rectal Q6H PRN Richard, Richard Stanley       amLODipine (NORVASC) tablet 5 mg  5 mg Oral Daily Richard, Richard Stanley       heparin  injection 5,000 Units  5,000 Units Subcutaneous Q8H Richard, Richard Stanley   5,000 Units at 03/18/24 0510   hydrALAZINE  (APRESOLINE ) injection 10 mg  10 mg Intravenous Q6H PRN Richard, Richard Stanley       ondansetron  (ZOFRAN ) tablet 4 mg  4 mg Oral Q6H PRN Richard, Richard Stanley       Or   ondansetron  (ZOFRAN ) injection 4 mg  4 mg Intravenous Q6H PRN Richard, Richard Stanley       sodium bicarbonate 150 mEq in sterile water 1,150 mL infusion   Intravenous Continuous Richard Richard BROCKS, Stanley 100 mL/hr at 03/18/24 0456 Infusion Verify at 03/18/24 0456   sodium zirconium cyclosilicate (LOKELMA) packet 10 g  10 g Oral Once Richard, Richard Stanley        Allergies: No Known Allergies  Social History: Social History   Tobacco Use   Smoking status: Former    Current packs/day: 0.00    Average packs/day: 1 pack/day for 38.0 years (38.0 ttl pk-yrs)    Types: Cigarettes    Start date: 06/10/1951    Quit date: 06/09/1989    Years since quitting: 34.7   Smokeless tobacco: Never   Tobacco comments:    Also smoke pipe in 1969 to 1991, several times a day  Vaping Use   Vaping status: Never Used  Substance Use Topics   Alcohol use: Not Currently   Drug use: No    Family History Family History  Problem Relation Age of Onset   Colon cancer Father    Cancer Father        colon   Other Sister        died after knee surgery    Review of Systems  Unable to perform ROS: Dementia     Objective   Vital signs in last 24 hours: BP (!) 151/85 (BP Location: Right Arm)   Pulse (!) 59   Temp 98.2 F (36.8 C) (Oral)   Resp 15   Ht 6' (1.829 m)   Wt 62 kg   SpO2 100%   BMI 18.54 kg/m   Physical Exam General:  somnolent, confused, chronically ill HEENT: [place]/AT Pulmonary: Normal work of breathing Cardiovascular: no cyanosis Abdomen: Soft, NTTP, nondistended GU: foley in place draining clear yellow urine   Most Recent Labs: Lab Results  Component Value Date   WBC 8.8 03/18/2024   HGB 10.3 (L) 03/18/2024   HCT 31.2 (L) 03/18/2024   PLT 184 03/18/2024    Lab Results  Component Value Date   NA 133 (L) 03/18/2024   K 5.7 (H) 03/18/2024   CL 94 (L) 03/18/2024   CO2 21 (L) 03/18/2024   BUN 137 (H) 03/18/2024   CREATININE 16.30 (H) 03/18/2024  CALCIUM  8.3 (L) 03/18/2024   MG 2.0 03/05/2022   PHOS 2.3 (L) 03/05/2022    Lab Results  Component Value Date   INR 1.1 12/21/2022   APTT 47 (H) 03/02/2022     Urine Culture: @[id number] (laburin,org,[id number])@   IMAGING: CT ABDOMEN PELVIS WO CONTRAST Result Date: 03/17/2024 CLINICAL DATA:  New onset renal failure. EXAM: CT ABDOMEN AND PELVIS WITHOUT CONTRAST TECHNIQUE: Multidetector CT imaging of the abdomen and pelvis was performed following the standard protocol without IV contrast. RADIATION DOSE REDUCTION: This exam was performed according to the departmental dose-optimization program which includes automated exposure control, adjustment of the mA and/or kV according to patient size and/or use of iterative reconstruction technique. COMPARISON:  CT 07/10/2023 FINDINGS: Lower chest: Lower lobe bronchial thickening. Trace bilateral pleural effusions with adjacent atelectasis. The heart is normal in size. Hepatobiliary: No evidence of focal liver abnormality on this unenhanced exam. Gallstone is well as high density material within the gallbladder lumen. No pericholecystic inflammation. No biliary dilatation. Pancreas: No ductal dilatation or inflammation. Spleen: Normal in size without focal abnormality. Adrenals/Urinary Tract: No adrenal nodule. Moderate bilateral hydroureteronephrosis. Ureters are dilated to the bladder insertion. No renal or  ureteral calculi. Simple cyst in the left kidney. The urinary bladder is decompressed by Foley catheter. Elevation of the Foley balloon in the bladder may be due to wall thickening or heterogeneous contents. Stomach/Bowel: The stomach is decompressed. No small bowel obstruction or inflammation. Proximal colon is decompressed. Moderate stool within the left colon. Sigmoid colonic diverticula without diverticulitis. Vascular/Lymphatic: Aortic atherosclerosis. No aortic aneurysm. No bulky lymphadenopathy. Reproductive: Enlarged prostate. Other: Generalized retroperitoneal stranding with trace fluid. No free air. Mild generalized body wall edema. Musculoskeletal: Severe L1 compression fracture has progressive loss of height from January. Severe L3 compression fracture is unchanged. Mild superior endplate compression fracture of L5 is new from prior. Pronounced osteoporosis. Remote bilateral rib fractures. IMPRESSION: 1. Moderate bilateral hydroureteronephrosis. Ureters are dilated to the bladder insertion. No renal or ureteral calculi. 2. The urinary bladder is decompressed by Foley catheter. Elevation of the Foley balloon in the bladder may be due to wall thickening or heterogeneous contents. 3. Enlarged prostate. 4. Severe L1 compression fracture has progressive loss of height from January. Severe L3 compression fracture is chronic. Mild superior endplate compression fracture of L5 is new from prior. 5. Cholelithiasis without gallbladder inflammation. 6. Trace bilateral pleural effusions with adjacent atelectasis. Aortic Atherosclerosis (ICD10-I70.0). Electronically Signed   By: Andrea Gasman M.D.   On: 03/17/2024 16:52   CT Head Wo Contrast Result Date: 03/17/2024 CLINICAL DATA:  Altered mental status.  Weakness. EXAM: CT HEAD WITHOUT CONTRAST TECHNIQUE: Contiguous axial images were obtained from the base of the skull through the vertex without intravenous contrast. RADIATION DOSE REDUCTION: This exam was  performed according to the departmental dose-optimization program which includes automated exposure control, adjustment of the mA and/or kV according to patient size and/or use of iterative reconstruction technique. COMPARISON:  02/08/2024 FINDINGS: Brain: No intracranial hemorrhage, mass effect, or midline shift. Stable atrophy and chronic small vessel ischemia. No hydrocephalus. The basilar cisterns are patent. No evidence of territorial infarct or acute ischemia. No extra-axial or intracranial fluid collection. Vascular: Atherosclerosis of skullbase vasculature without hyperdense vessel or abnormal calcification. Skull: No fracture or focal lesion. Sinuses/Orbits: No acute finding. Other: None. IMPRESSION: 1. No acute intracranial abnormality. 2. Stable atrophy and chronic small vessel ischemia. Electronically Signed   By: Andrea Gasman M.D.   On: 03/17/2024 14:30   DG Chest  1 View Result Date: 03/17/2024 CLINICAL DATA:  ams Patient BIB [facility] from home. Beginning last night has had generalized weakness. EXAM: CHEST  1 VIEW COMPARISON:  Chest XR, 06/11/2023.  CT chest, 03/20/2023. FINDINGS: Cardiac silhouette is within normal limits. Aortic arch atherosclerosis. Lungs are well inflated. No focal consolidation or mass. No pleural effusion or pneumothorax. Chronic-appearing RIGHT distal clavicular and multilevel RIGHT posterior rib fracture abnormalities. No acute displaced fracture. IMPRESSION: No acute cardiopulmonary process. Electronically Signed   By: Thom Hall M.D.   On: 03/17/2024 14:11    ------  Ole Bourdon, NP Pager: [phone]   Please contact the urology consult pager with any further questions/concerns.

## 2024-03-18 NOTE — TOC Initial Note (Signed)
 Transition of Care [facility]) - Initial/Assessment Note   Patient Details  Name: Richard Stanley MRN: [id number] Date of Birth: Mar 18, 1936  [facility] CM/SW Contact:    Duwaine GORMAN Aran, LCSW Phone Number: 03/18/2024, 1:32 PM  Clinical Narrative: PT evaluation recommended SNF. Patient not medically ready to work with OT and is currently oriented x1. Palliative consulted. Care management following for discharge planning.  Expected Discharge Plan:  (TBD) Barriers to Discharge: Continued Medical Work up  Patient Goals and CMS Choice Patient states their goals for this hospitalization and ongoing recovery are:: Disoriented x3  Expected Discharge Plan and Services In-house Referral: Clinical Social Work, Hospice / Palliative Care Living arrangements for the past 2 months: Apartment  Prior Living Arrangements/Services Living arrangements for the past 2 months: Apartment Lives with:: Adult Children, Relatives Patient language and need for interpreter reviewed:: Yes Do you feel safe going back to the place where you live?: Yes      Need for Family Participation in Patient Care: Yes (Comment) (Patient oriented x1.) Care giver support system in place?: Yes (comment) Criminal Activity/Legal Involvement Pertinent to Current Situation/Hospitalization: No - Comment as needed  Activities of Daily Living ADL Screening (condition at time of admission) Independently performs ADLs?: No Does the patient have a NEW difficulty with bathing/dressing/toileting/self-feeding that is expected to last >3 days?: Yes (Initiates electronic notice to provider for possible OT consult) Does the patient have a NEW difficulty with getting in/out of bed, walking, or climbing stairs that is expected to last >3 days?: Yes (Initiates electronic notice to provider for possible PT consult) Does the patient have a NEW difficulty with communication that is expected to last >3 days?: No Is the patient deaf or have  difficulty hearing?: No Does the patient have difficulty seeing, even when wearing glasses/contacts?: No Does the patient have difficulty concentrating, remembering, or making decisions?: No  Emotional Assessment Attitude/Demeanor/Rapport: Unable to Assess Affect (typically observed): Unable to Assess Orientation: : Oriented to Self Alcohol / Substance Use: Not Applicable Psych Involvement: No (comment)  Admission diagnosis:  Hyperkalemia [E87.5] AKI (acute Stanley injury) [N17.9] Acute renal failure, unspecified acute renal failure type [N17.9] Altered mental status, unspecified altered mental status type [R41.82] Patient Active Problem List   Diagnosis Date Noted   AKI (acute Stanley injury) 03/17/2024   Acute metabolic encephalopathy 03/17/2024   Urothelial carcinoma of bladder (HCC) 08/27/2023   Acute respiratory failure with hypoxia (HCC) 03/06/2022   Severe sepsis (HCC) 03/06/2022   CAP (community acquired pneumonia) 03/02/2022   Atrial fibrillation, chronic (HCC) 03/02/2022   Aggression 12/04/2021   COPD (chronic obstructive pulmonary disease) (HCC) 08/17/2017   Fall 08/17/2017   Closed right ankle fracture 08/17/2017   Leukocytosis 08/17/2017   HLD (hyperlipidemia)    Closed fracture of right distal fibula    Essential hypertension    Preventative health care 08/27/2011   CKD (chronic Stanley disease), stage III (HCC) 08/27/2011   Atrial fibrillation with RVR (HCC) 05/15/2011   Thyroid  nodule 10/01/2010   Rash 10/01/2010   Asthma 09/26/2010   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 [place]   [address] Phone: [phone] Fax: [phone]  Social  Drivers of Health (SDOH) Social History: SDOH Screenings   Food Insecurity: No Food Insecurity (03/17/2024)  Housing: Low Risk  (03/17/2024)  Transportation Needs: No Transportation Needs (03/17/2024)  Utilities: Not At Risk (03/17/2024)  Financial Resource Strain: Low Risk  (02/17/2024)   Received from [facility]  Physical Activity: Unknown (05/13/2023)   Received from [facility]  Social Connections: Unknown (03/17/2024)  Stress: No Stress Concern Present (05/13/2023)   Received from [facility]  Tobacco Use: Medium Risk (03/17/2024)   SDOH Interventions:    Readmission Risk Interventions     No data to display

## 2024-03-18 NOTE — Consult Note (Signed)
 Consultation Note Date: 03/18/2024   Patient Name: Richard Stanley  DOB: 1936-04-26  MRN: [id number]  Age / Sex: 88 y.o., male   PCP: Wallie Drafts, NP Referring Physician: Davia Nydia POUR, MD  Reason for Consultation: Establishing goals of care     Chief Complaint/History of Present Illness:   Patient is a 88 year old male with past medical history of atrial fibrillation, hypertension, CKD stage III, COPD, dementia, depression, hyperlipidemia, and aggressive bladder cancer who was admitted on 03/17/2024 for management of AMS and generalized weakness.  Patient has history of TURBT by urology in March 2025 which showed concern and was recommended for rebiopsy as well as radical cystectomy though patient and wife declined this intervention; noted they wanted to focus on quality of life with his advanced age.  Since admission, patient receiving management for acute renal failure superimposed on CKD, metabolic acidosis, severe hyperkalemia, acute metabolic encephalopathy and myoclonic jerking likely secondary to uremia from acute renal failure, and possible urinary tract infection.  Urology, neurology, and nephrology consulted for recommendations.  Palliative medicine team consulted to assist with complex medical decision making.  Extensive review of EMR including recent documentation from hospitalist, PT/OT, nephrologist, and neurologist.  Patient is in acute renal failure.  Patient and wife have refused aggressive medical interventions such as dialysis or further workup of aggressive bladder cancer.  Medical team recommending comfort focused care at this time in this setting knowing patient is at end-of-life.  Presented to bedside earlier in the day to meet with patient.  No visitors present at bedside.  Patient laying in bed and appears acutely ill.  Patient noted to have myoclonic jerking.  Patient lethargic though will awaken to interact with this provider.  Patient is hard of hearing so have to  increase volume of voice to speak with him.  Introduced myself as a member of the palliative medicine team.  Patient is confused and not sure why he is here.  He is able to state that he is in pain all over and has a headache.  Noted would reach out to his wife to discuss care moving forward.  Attempted to call wife at that time without answer.  ------------------------------------------------------------------------------------------------------------- Advance Care Planning Conversation  Pertinent diagnosis: Acute renal failure, aggressive bladder cancer, COPD, dementia, hyperkalemia, myoclonic jerking secondary to uremia  The patient and family consented to a voluntary Advance Care Planning Conversation in person/over the phone. Individuals present for the conversation: This palliative medicine provider discussed care with patient's wife in person and their adopted daughter over the phone.  Summary of the conversation:  Informed later in day by RN that patient's wife had presented to bedside.  Presented to bedside to speak with wife.  Introduced myself as a member of the palliative medicine team my role in patient's medical journey.  Able to provide medical updates about patient's current medical care.  Learned about patient's quality of life and prior medical history.  Wife noted patient did not want aggressive medical interventions.  Discussed that patient is in acute renal failure and he is at end-of-life because of this.  Wife appropriately tearful during conversation.  Discussed pathways for medical care moving forward knowing that patient did not want aggressive medical interventions.  Discussed transitioning to comfort focused care at this time.  Explained what comfort focused care would and would not entail such as discontinuing IV fluids, imaging, and blood work.  Noted would focus on symptom management at end-of-life including pain, nausea/vomiting, and agitation.  Wife agreeing  with transition  to comfort focused care at this time.   Did express urgency that with patient's medical situation, could be hours to days.  Recommended that family come to bedside who would want to visit with patient and say goodbye.  Wife noted concern about telling their adopted daughter.  Wife noted that their daughter blames her for patient's cancer because she put sugar on the his oatmeal every morning.  Wife noted that daughter is a vegan and thinks that if patient would have fallen to a different diet, this would not have happened.  Explained to wife that cancer does happen and this is not her fault.  Noted that diet did not hasten or cause patient to die.  Spent time providing emotional support via active listening.    During conversation with wife, daughter called.  Wife noted she was fearful of talking to daughter as she did not want daughter to be angry with her about the situation.  Wife was able to put her daughter on speaker phone and with permission this provider was able to speak with daughter.  After introducing myself as a member of the palliative medicine team.  Spent time providing medical updates.  Discussed how patient has not wanted aggressive medical interventions and he is now in multisystem organ failure due to his underlying cancer and will die from acute renal failure.  Daughter confrontational about this information.  She noted that she has had  patients survive with renal disease.  Again discussed acknowledging someone's wishes about medical care and that he not just has renal failure, he also has an underlying aggressive bladder cancer.  Noted plan for transitioning to comfort focused care at this time to allow patient symptom management at end-of-life.  Daughter wanted to speak with her mother about this.  Acknowledged wishes regarding this.    After phone call with daughter, confirmed with patient's wife again that plan is transitioning to comfort focused care at this time which she  supports and knows patient would want.  Answered questions as able.  Noted palliative medicine team will continue to follow along with patient's medical journey.  Outcome of the conversations and/or documents completed:  Transition to comfort focused care at this time.  I spent 50 minutes providing separately identifiable ACP services with the patient and/or surrogate decision maker in a voluntary, in-person conversation discussing the patient's wishes and goals as detailed in the above note.  Tinnie Radar, DO Palliative Medicine Provider  -------------------------------------------------------------------------------------------------------------  Discussed care with hospitalist, RN, chaplain, and nephrologist coordinate care.  Plan to transition to comfort focused care at this time.  Did inform RN of anger voiced by daughter over the phone and fearfulness wife expressed about daughter's processing of this information to make sure that wife has support during this difficult time.  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   CALCU GALLBLADD&BD W/O CHOLCYST W/O MENTION OBST 06/28/2010   CKD (chronic kidney disease), stage III (HCC)    COPD (chronic obstructive pulmonary  disease) (HCC)    Dementia (HCC)    DEPRESSION 06/22/2009   ERECTILE DYSFUNCTION, 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   Headache    HYPERLIPIDEMIA 06/18/2009   Nodule of right lung    Other diseases of  lung, not elsewhere classified 06/18/2009   Rash 10/01/2010   Thyroid  nodule 10/01/2010   Social History   Socioeconomic History   Marital status: Married    Spouse name: Not on file   Number of children: 2   Years of education: Not on file   Highest education level: Not on file  Occupational History   Occupation: retired from [employer]  Tobacco Use   Smoking status: Former    Current packs/day: 0.00    Average packs/day: 1 pack/day for 38.0 years (38.0 ttl pk-yrs)    Types: Cigarettes    Start date: 06/10/1951    Quit date: 06/09/1989    Years since quitting: 34.7   Smokeless tobacco: Never   Tobacco comments:    Also smoke pipe in 1969 to 1991, several times a day  Vaping Use   Vaping status: Never Used  Substance and Sexual Activity   Alcohol use: Not Currently   Drug use: No   Sexual activity: Not Currently  Other Topics Concern   Not on file  Social History Narrative   Pt lives with wife, son lives with them.   Social Drivers of Corporate investment banker Strain: Low Risk  (02/17/2024)   Received from [facility]   Overall Financial Resource Strain (CARDIA)    How hard is it for you to pay for the very basics like food, housing, medical care, and heating?: Not hard at all  Food Insecurity: No Food Insecurity (03/17/2024)   Hunger Vital Sign    Worried About Running Out of Food in the Last Year: Never true    Ran Out of Food in the Last Year: Never true  Transportation Needs: No Transportation Needs (03/17/2024)   PRAPARE - Administrator, Civil Service (Medical): No    Lack of Transportation (Non-Medical): No  Physical Activity: Unknown (05/13/2023)   Received from [facility]   Exercise Vital Sign    On average, how many days per week do you engage in moderate to strenuous exercise (like a brisk walk)?: 0 days    Minutes of Exercise per Session: Not on file  Stress: No Stress Concern Present (05/13/2023)   Received from [facility] - Occupational Stress Questionnaire    Feeling of Stress : Not at all  Social Connections: Unknown (03/17/2024)   Social Connection and Isolation Panel    Frequency of Communication with Friends and Family: Not on file    Frequency of Social Gatherings with Friends and Family: Not on file    Attends Religious Services: Never    Database administrator or Organizations: No    Attends Banker Meetings: Never    Marital Status: Not on file   Family History  Problem Relation Age of Onset   Colon cancer Father    Cancer Father        colon   Other Sister        died after knee surgery   Scheduled Meds:  amLODipine  5 mg Oral Daily   heparin   5,000 Units Subcutaneous Q8H   sodium zirconium cyclosilicate  10 g Oral Once   Continuous Infusions:  cefTRIAXone  (ROCEPHIN )  IV  sodium bicarbonate 150 mEq in sterile water 1,150 mL infusion 100 mL/hr at 03/18/24 0456   PRN Meds:.acetaminophen  **OR** acetaminophen , hydrALAZINE , ondansetron  **OR** ondansetron  (ZOFRAN ) IV No Known Allergies CBC:    Component Value Date/Time   WBC 8.8 03/18/2024 0623   HGB 10.3 (L) 03/18/2024 0623   HCT 31.2 (L) 03/18/2024 0623   PLT 184 03/18/2024 0623   MCV 87.2 03/18/2024 0623   NEUTROABS 7.4 02/08/2024 2124   LYMPHSABS 0.9 02/08/2024 2124   MONOABS 0.8 02/08/2024 2124   EOSABS 0.1 02/08/2024 2124   BASOSABS 0.0 02/08/2024 2124   Comprehensive Metabolic Panel:    Component Value Date/Time   NA 133 (L) 03/18/2024 0623   K 5.7 (H) 03/18/2024 0623   CL 94 (L) 03/18/2024 0623   CO2 21 (L) 03/18/2024 0623   BUN 137 (H) 03/18/2024 0623   CREATININE 16.30 (H) 03/18/2024 0623   GLUCOSE 84 03/18/2024 0623   CALCIUM  8.3 (L) 03/18/2024 0623   AST 22 02/08/2024 2124   ALT 15 02/08/2024 2124   ALKPHOS 116 02/08/2024 2124   BILITOT 0.3 02/08/2024 2124   PROT 6.0 (L) 02/08/2024 2124   ALBUMIN 3.8 02/08/2024 2124    Physical Exam: Vital Signs:  BP (!) 171/90 (BP Location: Left Arm)   Pulse 62   Temp 98.3 F (36.8 C) (Oral)   Resp 15   Ht 6' (1.829 m)   Wt 62 kg   SpO2 97%   BMI 18.54 kg/m  SpO2: SpO2: 97 % O2 Device: O2 Device: Room Air O2 Flow Rate:   Intake/output summary:  Intake/Output Summary (Last 24 hours) at 03/18/2024 1002 Last data filed at 03/18/2024 0456 Gross per 24 hour  Intake 1337.81 ml  Output 20 ml  Net 1317.81 ml   LBM: Last BM Date : 03/17/24 Baseline Weight: Weight: 62 kg Most recent weight: Weight: 62 kg  General: Ill-appearing, awake though lethargic, hard of hearing, shaking Cardiovascular: RRR Respiratory: no increased work of breathing noted, not in respiratory distress Abdomen: not distended Skin: Multiple ecchymoses on upper extremities bilaterally Neuro: Awake though lethargic at times          Palliative Performance Scale: 20%              Additional Data Reviewed: Recent Labs    03/17/24 1258 03/17/24 1448 03/17/24 1743 03/18/24 0623  WBC 7.3  --   --  8.8  HGB 11.2* 10.5*  --  10.3*  PLT 208  --   --  184  NA 131* 130* 133* 133*  BUN 137* >130* 121* 137*  CREATININE 15.90* 18.00* 13.90* 16.30*    Imaging: CT ABDOMEN PELVIS WO CONTRAST CLINICAL DATA:  New onset renal failure.  EXAM: CT ABDOMEN AND PELVIS WITHOUT CONTRAST  TECHNIQUE: Multidetector CT imaging of the abdomen and pelvis was performed following the standard protocol without IV contrast.  RADIATION DOSE REDUCTION: This exam was performed according to the departmental dose-optimization program which includes automated exposure control, adjustment of the mA and/or kV according to patient size and/or use of iterative reconstruction technique.  COMPARISON:  CT 07/10/2023  FINDINGS: Lower chest: Lower lobe bronchial thickening. Trace bilateral pleural effusions with adjacent atelectasis. The heart is normal in size.  Hepatobiliary: No evidence of focal liver abnormality on this unenhanced exam.  Gallstone is well as high density material within the gallbladder lumen. No pericholecystic inflammation. No biliary dilatation.  Pancreas: No ductal dilatation or inflammation.  Spleen: Normal in size without focal abnormality.  Adrenals/Urinary Tract:  No adrenal nodule. Moderate bilateral hydroureteronephrosis. Ureters are dilated to the bladder insertion. No renal or ureteral calculi. Simple cyst in the left kidney. The urinary bladder is decompressed by Foley catheter. Elevation of the Foley balloon in the bladder may be due to wall thickening or heterogeneous contents.  Stomach/Bowel: The stomach is decompressed. No small bowel obstruction or inflammation. Proximal colon is decompressed. Moderate stool within the left colon. Sigmoid colonic diverticula without diverticulitis.  Vascular/Lymphatic: Aortic atherosclerosis. No aortic aneurysm. No bulky lymphadenopathy.  Reproductive: Enlarged prostate.  Other: Generalized retroperitoneal stranding with trace fluid. No free air. Mild generalized body wall edema.  Musculoskeletal: Severe L1 compression fracture has progressive loss of height from January. Severe L3 compression fracture is unchanged. Mild superior endplate compression fracture of L5 is new from prior. Pronounced osteoporosis. Remote bilateral rib fractures.  IMPRESSION: 1. Moderate bilateral hydroureteronephrosis. Ureters are dilated to the bladder insertion. No renal or ureteral calculi. 2. The urinary bladder is decompressed by Foley catheter. Elevation of the Foley balloon in the bladder may be due to wall thickening or heterogeneous contents. 3. Enlarged prostate. 4. Severe L1 compression fracture has progressive loss of height from January. Severe L3 compression fracture is chronic. Mild superior endplate compression fracture of L5 is new from prior. 5. Cholelithiasis without gallbladder inflammation. 6. Trace bilateral pleural effusions with adjacent  atelectasis.  Aortic Atherosclerosis (ICD10-I70.0).  Electronically Signed   By: Andrea Gasman M.D.   On: 03/17/2024 16:52 CT Head Wo Contrast CLINICAL DATA:  Altered mental status.  Weakness.  EXAM: CT HEAD WITHOUT CONTRAST  TECHNIQUE: Contiguous axial images were obtained from the base of the skull through the vertex without intravenous contrast.  RADIATION DOSE REDUCTION: This exam was performed according to the departmental dose-optimization program which includes automated exposure control, adjustment of the mA and/or kV according to patient size and/or use of iterative reconstruction technique.  COMPARISON:  02/08/2024  FINDINGS: Brain: No intracranial hemorrhage, mass effect, or midline shift. Stable atrophy and chronic small vessel ischemia. No hydrocephalus. The basilar cisterns are patent. No evidence of territorial infarct or acute ischemia. No extra-axial or intracranial fluid collection.  Vascular: Atherosclerosis of skullbase vasculature without hyperdense vessel or abnormal calcification.  Skull: No fracture or focal lesion.  Sinuses/Orbits: No acute finding.  Other: None.  IMPRESSION: 1. No acute intracranial abnormality. 2. Stable atrophy and chronic small vessel ischemia.  Electronically Signed   By: Andrea Gasman M.D.   On: 03/17/2024 14:30 DG Chest 1 View CLINICAL DATA:  ams  Patient BIB [facility] from home. Beginning last night has had generalized weakness.  EXAM: CHEST  1 VIEW  COMPARISON:  Chest XR, 06/11/2023.  CT chest, 03/20/2023.  FINDINGS: Cardiac silhouette is within normal limits. Aortic arch atherosclerosis. Lungs are well inflated. No focal consolidation or mass. No pleural effusion or pneumothorax. Chronic-appearing RIGHT distal clavicular and multilevel RIGHT posterior rib fracture abnormalities. No acute displaced fracture.  IMPRESSION: No acute cardiopulmonary process.  Electronically Signed   By: Thom Hall  M.D.   On: 03/17/2024 14:11    I personally reviewed recent imaging.   Palliative Care Assessment and Plan Summary of Established Goals of Care and Medical Treatment Preferences   Patient is a 88 year old male with past medical history of atrial fibrillation, hypertension, CKD stage III, COPD, dementia, depression, hyperlipidemia, and aggressive bladder cancer who was admitted on 03/17/2024 for management of AMS and generalized weakness.  Patient has history of TURBT by urology in March 2025 which showed concern and  was recommended for rebiopsy as well as radical cystectomy though patient and wife declined this intervention; noted they wanted to focus on quality of life with his advanced age.  Since admission, patient receiving management for acute renal failure superimposed on CKD, metabolic acidosis, severe hyperkalemia, acute metabolic encephalopathy and myoclonic jerking likely secondary to uremia from acute renal failure, and possible urinary tract infection.  Urology, neurology, and nephrology consulted for recommendations.  Palliative medicine team consulted to assist with complex medical decision making.  # Complex medical decision making/goals of care  -Patient unable to participate in medical decision making secondary to mental status.  -Spoke with patient's wife at bedside and also their adopted daughter over the phone.  Discussed patient's medical concerns at this time, primarily acute renal failure in the setting of aggressive bladder cancer.  Patient has never wanted aggressive medical interventions as per wife.  Discussed pathways for medical care moving forward.  Wife agreeing with transition to comfort focused care at this time knowing patient is at end-of-life.  Daughter voicing anger and frustration about situation.  Involving chaplain for support.  Proceeding with transition to comfort focused care at this time as per wife's wishes for the patient.  -At this time we will discontinue  interventions that are no longer focused on comfort such as IV fluids, imaging, or lab work.  Will instead focus on symptom management of pain, dyspnea, and agitation in the setting of end-of-life care.    Code Status: Do not attempt resuscitation (DNR) - Comfort care  # Symptom management Patient is receiving these palliative interventions for symptom management with an intent to improve quality of life.     -Pain/Dyspnea, acute in the setting of end-of-life care                               -Start IV Dilaudid 0.5-2 mg IV every 30 minutes as needed.  Continue to adjust based on patient's symptom burden.  If patient needing frequent dosing, may need to consider continuous infusion.                  -Anxiety/agitation, in the setting of end-of-life care                               -Start as needed Ativan.  Continue to adjust based on patient's symptom burden.                  -Secretions, in the setting of end-of-life care                               -Start as needed glycopyrrolate.   # Psycho-social/Spiritual Support:  - Support System: Wife, adopted daughter, son who lives in [place]  - Desire for further Chaplain support:yes  # Discharge Planning:  Anticipated Hospital Death - Transition to comfort focused care at this time.  Should patient stabilize and appropriate, consider referral for inpatient hospice transfer.  Thank you for allowing the palliative care team to participate in the care Richard Stanley.  Tinnie Radar, DO Palliative Care Provider PMT # [phone]  If patient remains symptomatic despite maximum doses, please call PMT at [phone] between 0700 and 1900. Outside of these hours, please call attending, as PMT does not have night coverage.  Billing based on MDM: High  Problems  Addressed: One or more chronic illnesses with severe exacerbation, progression, or side effects of treatment.  Risks: Parenteral controlled substances

## 2024-03-18 NOTE — Progress Notes (Signed)
 I met with Richard Stanley's wife, Richard Stanley, to provide her with support.  She wants her husband to be at peace and to not suffer.  I encouraged her to sit beside him and provide him with peace and comfort.  I offered a prayer for her and Stephane and Hussain was alert for this.  If needs arise over the weekend, please page the chaplain on-call.  [phone].

## 2024-03-18 NOTE — Evaluation (Signed)
 Physical Therapy Evaluation Patient Details Name: Richard Stanley MRN: [id number] DOB: [birth year] Today's Date: 03/18/2024  History of Present Illness  88 y.o. male presented to ED via EMS from home with AMS. Dx of AKI on CKD, bladder outlet obstruction likely 2* cancer, acute metabolic encephalopathy. Pt with PMH significant of atrial fibrillation, hypertension, CKD stage III, COPD, dementia, depression, hyperlipidemia, bladder cancer.  Clinical Impression  Pt admitted with above diagnosis. Pt oriented to self, he was able to state his birthdate and that he is in a hospital, pt was not able to provide home nor prior level of function information. Mod assist for supine to sit. At edge of bed pt was extremely tremulous, his whole body was shaking, he initiated return to supine. +2 max assist to scoot up in bed. Bed placed in chair position. No family present to obtain home/prior level of function information. If they are unable to care for him at home he will need SNF level of care.   Pt currently with functional limitations due to the deficits listed below (see PT Problem List). Pt will benefit from acute skilled PT to increase their independence and safety with mobility to allow discharge.           If plan is discharge home, recommend the following: Two people to help with walking and/or transfers;A lot of help with bathing/dressing/bathroom;Assistance with cooking/housework;Assist for transportation;Help with stairs or ramp for entrance   Can travel by private vehicle   No    Equipment Recommendations Other (comment) (TBD depending on progress and DC plan)  Recommendations for Other Services       Functional Status Assessment Patient has had a recent decline in their functional status and demonstrates the ability to make significant improvements in function in a reasonable and predictable amount of time.     Precautions / Restrictions Precautions Precautions: Fall Recall of  Precautions/Restrictions: Impaired Restrictions Weight Bearing Restrictions Per Provider Order: No      Mobility  Bed Mobility Overal bed mobility: Needs Assistance Bed Mobility: Supine to Sit     Supine to sit: Mod assist     General bed mobility comments: assist to raise trunk and pivot hips to edge of bed; pt very tremulous sitting edge of bed, unable to maintain static sitting, pt returned himself to supine, pt denied pain, denied being cold    Transfers                        Ambulation/Gait                  Stairs            Wheelchair Mobility     Tilt Bed    Modified Rankin (Stroke Patients Only)       Balance Overall balance assessment: Needs assistance Sitting-balance support: Feet supported, Bilateral upper extremity supported Sitting balance-Leahy Scale: Poor Sitting balance - Comments: sat EOB ~5 seconds but was very tremulous and returned himself to supine                                     Pertinent Vitals/Pain Pain Assessment Pain Assessment: Faces Faces Pain Scale: No hurt    Home Living                     Additional Comments: no family present, pt not able to  provide hx 2* confusion    Prior Function Prior Level of Function : Patient poor historian/Family not available                     Extremity/Trunk Assessment   Upper Extremity Assessment Upper Extremity Assessment: Difficult to assess due to impaired cognition    Lower Extremity Assessment Lower Extremity Assessment: Difficult to assess due to impaired cognition    Cervical / Trunk Assessment Cervical / Trunk Assessment: Kyphotic  Communication   Communication Communication: Impaired Factors Affecting Communication: Hearing impaired    Cognition Arousal: Alert     PT - Cognitive impairments: No family/caregiver present to determine baseline, Difficult to assess Difficult to assess due to: Hard of hearing/deaf                      PT - Cognition Comments: pt able to state his birthdate, stated he is in the hospital, not able to provide home nor prior functional level information Following commands: Impaired Following commands impaired: Follows one step commands inconsistently, Follows one step commands with increased time     Cueing Cueing Techniques: Verbal cues, Tactile cues     General Comments      Exercises     Assessment/Plan    PT Assessment Patient needs continued PT services  PT Problem List Decreased activity tolerance;Decreased mobility;Decreased cognition       PT Treatment Interventions Gait training;Therapeutic exercise;Functional mobility training;Therapeutic activities;Patient/family education    PT Goals (Current goals can be found in the Care Plan section)  Acute Rehab PT Goals PT Goal Formulation: Patient unable to participate in goal setting Time For Goal Achievement: 04/01/24 Potential to Achieve Goals: Fair    Frequency Min 2X/week     Co-evaluation               AM-PAC PT 6 Clicks Mobility  Outcome Measure Help needed turning from your back to your side while in a flat bed without using bedrails?: A Lot Help needed moving from lying on your back to sitting on the side of a flat bed without using bedrails?: A Lot Help needed moving to and from a bed to a chair (including a wheelchair)?: Total Help needed standing up from a chair using your arms (e.g., wheelchair or bedside chair)?: Total Help needed to walk in hospital room?: Total Help needed climbing 3-5 steps with a railing? : Total 6 Click Score: 8    End of Session   Activity Tolerance: Other (comment) (very tremulous with activity) Patient left: in bed;with bed alarm set;with call bell/phone within reach Nurse Communication: Mobility status PT Visit Diagnosis: Difficulty in walking, not elsewhere classified (R26.2)    Time: 9074-9063 PT Time Calculation (min) (ACUTE ONLY): 11  min   Charges:   PT Evaluation $PT Eval Moderate Complexity: 1 Mod   PT General Charges $$ ACUTE PT VISIT: 1 Visit         Sylvan Delon Copp PT 03/18/2024  Acute Rehabilitation Services  Office [phone]
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
## 2024-03-18 NOTE — Progress Notes (Signed)
 [facility]                                                                              Constantin Hillery, is a 88 y.o. male, DOB - July 27, 1935, FMW:[id number] Admit date - 03/17/2024    Outpatient Primary MD for the patient is McClanahan, Kyra, NP  LOS - 1  days  Chief Complaint  Patient presents with   Weakness       Brief summary   Patient is a 88 y.o. male with atrial fibrillation, hypertension, CKD stage III, COPD, dementia, depression, hyperlipidemia presented to ED via EMS from home. Family had reported to the EMS that he was not acting his self and was confused.  Due to altered mental status, patient was not able to provide good history.  He did report that he had no nausea vomiting or any diarrhea.   At baseline ambulates with a walker.  Per family, patient was very weak and not able to stand up on his own this morning.  EMS was concerned about UTI. Patient was not able to tell if he had any difficulty urinating.  On admission, Labs showed sodium 131, potassium 6.1, chloride 96, CO2 13, BUN 137, creatinine 15.9 Baseline creatinine 1.6 on 02/08/2024  Assessment & Plan    Severe AKI (acute kidney injury) superimposed on CKD stage IIIb with hyperkalemia, AG metabolic acidosis - Baseline creatinine 1.6 on 02/08/2024, on admission creatinine 15.9-> 18.0 - CT abdomen showed moderate bilateral hydroureteronephrosis, ureters are dilated to the bladder insertion, no renal or ureteral calculi.  Enlarged prostate. -Foley catheter placed, initially creatinine improved after Foley placement however trending up again to 16.3 today - Urology consulted, patient has aggressive bladder CA, likely the cause of bladder outlet obstruction.  Patient and his family had declined workup, treatment or surgery in the past. -Discussed with urology, recommended to continue Foley.  If renal function did not improve, likely this would be terminal and palliative medicine consult for GOC.     Severe hyperkalemia - Received albuterol  nebs, IV calcium  gluconate, sodium bicarb, Lokelma, received insulin/D50 in ER - Calcium  improved to 4.9, trending up again today -Placed on Lokelma 10 g x 1    Acute metabolic encephalopathy, myoclonic jerking movements likely due to uremia, #1  - Likely due to #1 - CT head showed no acute intracranial abnormality - Still having profound metabolic encephalopathy, jerking movements, for now placed on IV Rocephin  - Neurology consulted   Possible urinary tract infection -Follow urine culture and sensitivities, placed on IV Rocephin    Atrial fibrillation, chronic - Had history of cardioversion on 11/12/2021, follows Dr. Ladona  - HR in 50-60s, hold amiodarone  - Not on any anticoagulation  Hypertension -Placed on Norvasc 5 mg daily, hydralazine  IV with parameters       COPD (chronic obstructive pulmonary disease) (HCC) -Currently no wheezing    Anemia of chronic disease, normocytic - Baseline hemoglobin 13.0 on 02/08/2024,  -H&H stable, no active bleeding   History of aggressive urothelial carcinoma of bladder (HCC) - CT abdomen reviewed, urology consulted   Pressure Injury Documentation: POA Wound 03/17/24 2115 Pressure Injury Ischial tuberosity Left  Stage 2 -  Partial thickness loss of dermis presenting as a shallow open injury with a red, pink wound bed without slough. (Active)  -Continue wound care  Underweight Estimated body mass index is 18.54 kg/m as calculated from the following:   Height as of this encounter: 6' (1.829 m).   Weight as of this encounter: 62 kg.  Code Status: DNR DVT Prophylaxis:  heparin  injection 5,000 Units Start: 03/17/24 2200   Level of Care: Level of care: Progressive Family Communication: No family at the bedside Disposition Plan:      Remains inpatient appropriate:      Procedures:    Consultants:   Urology Nephrology Palliative medicine  Antimicrobials:   Anti-infectives (From  admission, onward)    Start     Dose/Rate Route Frequency Ordered Stop   03/18/24 0900  cefTRIAXone  (ROCEPHIN ) 1 g in sodium chloride  0.9 % 100 mL IVPB        1 g 200 mL/hr over 30 Minutes Intravenous Every 24 hours 03/18/24 0800            Medications  amLODipine  5 mg Oral Daily   heparin   5,000 Units Subcutaneous Q8H   sodium zirconium cyclosilicate  10 g Oral Once      Subjective:   Bentzion Dauria was seen and examined today.  Awake but very confused and having jerking movements.  States no pain.  Difficult to obtain ROS from the patient.    Objective:   Vitals:   03/17/24 1730 03/17/24 2056 03/18/24 0400 03/18/24 0825  BP: 117/77 (!) 152/70 (!) 151/85 (!) 171/90  Pulse: 63 62 (!) 59 62  Resp: 15  15   Temp:  97.9 F (36.6 C) 98.2 F (36.8 C) 98.3 F (36.8 C)  TempSrc:  Oral Oral Oral  SpO2: 97% 96% 100% 97%  Weight:      Height:        Intake/Output Summary (Last 24 hours) at 03/18/2024 1112 Last data filed at 03/18/2024 0456 Gross per 24 hour  Intake 1337.81 ml  Output 20 ml  Net 1317.81 ml     Wt Readings from Last 3 Encounters:  03/17/24 62 kg  02/08/24 61.7 kg  10/29/23 63.6 kg     Exam General: Awake, responsive yes or no but no meaningful conversation. Cardiovascular: S1 S2 auscultated,  RRR Respiratory: Clear to auscultation bilaterally, no wheezing Gastrointestinal: Soft, nontender, nondistended, + bowel sounds Ext: no pedal edema bilaterally Neuro: Difficult to assess with mental status Psych: Confused    Data Reviewed:  I have personally reviewed following labs    CBC Lab Results  Component Value Date   WBC 8.8 03/18/2024   RBC 3.58 (L) 03/18/2024   HGB 10.3 (L) 03/18/2024   HCT 31.2 (L) 03/18/2024   MCV 87.2 03/18/2024   MCH 28.8 03/18/2024   PLT 184 03/18/2024   MCHC 33.0 03/18/2024   RDW 13.5 03/18/2024   LYMPHSABS 0.9 02/08/2024   MONOABS 0.8 02/08/2024   EOSABS 0.1 02/08/2024   BASOSABS 0.0 02/08/2024      Last metabolic panel Lab Results  Component Value Date   NA 133 (L) 03/18/2024   K 5.7 (H) 03/18/2024   CL 94 (L) 03/18/2024   CO2 21 (L) 03/18/2024   BUN 137 (H) 03/18/2024   CREATININE 16.30 (H) 03/18/2024   GLUCOSE 84 03/18/2024   GFRNONAA 3 (L) 03/18/2024   GFRAA 44 (L) 02/22/2018   CALCIUM  8.3 (L) 03/18/2024   PHOS 2.3 (L) 03/05/2022  PROT 6.0 (L) 02/08/2024   ALBUMIN 3.8 02/08/2024   BILITOT 0.3 02/08/2024   ALKPHOS 116 02/08/2024   AST 22 02/08/2024   ALT 15 02/08/2024   ANIONGAP 19 (H) 03/18/2024    CBG (last 3)  Recent Labs    03/17/24 2203  GLUCAP 100*      Coagulation Profile: No results for input(s): INR, PROTIME in the last 168 hours.   Radiology Studies: I have personally reviewed the imaging studies  CT ABDOMEN PELVIS WO CONTRAST Result Date: 03/17/2024 CLINICAL DATA:  New onset renal failure. EXAM: CT ABDOMEN AND PELVIS WITHOUT CONTRAST TECHNIQUE: Multidetector CT imaging of the abdomen and pelvis was performed following the standard protocol without IV contrast. RADIATION DOSE REDUCTION: This exam was performed according to the departmental dose-optimization program which includes automated exposure control, adjustment of the mA and/or kV according to patient size and/or use of iterative reconstruction technique. COMPARISON:  CT 07/10/2023 FINDINGS: Lower chest: Lower lobe bronchial thickening. Trace bilateral pleural effusions with adjacent atelectasis. The heart is normal in size. Hepatobiliary: No evidence of focal liver abnormality on this unenhanced exam. Gallstone is well as high density material within the gallbladder lumen. No pericholecystic inflammation. No biliary dilatation. Pancreas: No ductal dilatation or inflammation. Spleen: Normal in size without focal abnormality. Adrenals/Urinary Tract: No adrenal nodule. Moderate bilateral hydroureteronephrosis. Ureters are dilated to the bladder insertion. No renal or ureteral calculi. Simple  cyst in the left kidney. The urinary bladder is decompressed by Foley catheter. Elevation of the Foley balloon in the bladder may be due to wall thickening or heterogeneous contents. Stomach/Bowel: The stomach is decompressed. No small bowel obstruction or inflammation. Proximal colon is decompressed. Moderate stool within the left colon. Sigmoid colonic diverticula without diverticulitis. Vascular/Lymphatic: Aortic atherosclerosis. No aortic aneurysm. No bulky lymphadenopathy. Reproductive: Enlarged prostate. Other: Generalized retroperitoneal stranding with trace fluid. No free air. Mild generalized body wall edema. Musculoskeletal: Severe L1 compression fracture has progressive loss of height from January. Severe L3 compression fracture is unchanged. Mild superior endplate compression fracture of L5 is new from prior. Pronounced osteoporosis. Remote bilateral rib fractures. IMPRESSION: 1. Moderate bilateral hydroureteronephrosis. Ureters are dilated to the bladder insertion. No renal or ureteral calculi. 2. The urinary bladder is decompressed by Foley catheter. Elevation of the Foley balloon in the bladder may be due to wall thickening or heterogeneous contents. 3. Enlarged prostate. 4. Severe L1 compression fracture has progressive loss of height from January. Severe L3 compression fracture is chronic. Mild superior endplate compression fracture of L5 is new from prior. 5. Cholelithiasis without gallbladder inflammation. 6. Trace bilateral pleural effusions with adjacent atelectasis. Aortic Atherosclerosis (ICD10-I70.0). Electronically Signed   By: Andrea Gasman M.D.   On: 03/17/2024 16:52   CT Head Wo Contrast Result Date: 03/17/2024 CLINICAL DATA:  Altered mental status.  Weakness. EXAM: CT HEAD WITHOUT CONTRAST TECHNIQUE: Contiguous axial images were obtained from the base of the skull through the vertex without intravenous contrast. RADIATION DOSE REDUCTION: This exam was performed according to the  departmental dose-optimization program which includes automated exposure control, adjustment of the mA and/or kV according to patient size and/or use of iterative reconstruction technique. COMPARISON:  02/08/2024 FINDINGS: Brain: No intracranial hemorrhage, mass effect, or midline shift. Stable atrophy and chronic small vessel ischemia. No hydrocephalus. The basilar cisterns are patent. No evidence of territorial infarct or acute ischemia. No extra-axial or intracranial fluid collection. Vascular: Atherosclerosis of skullbase vasculature without hyperdense vessel or abnormal calcification. Skull: No fracture or focal lesion. Sinuses/Orbits:  No acute finding. Other: None. IMPRESSION: 1. No acute intracranial abnormality. 2. Stable atrophy and chronic small vessel ischemia. Electronically Signed   By: Andrea Gasman M.D.   On: 03/17/2024 14:30   DG Chest 1 View Result Date: 03/17/2024 CLINICAL DATA:  ams Patient BIB [facility] from home. Beginning last night has had generalized weakness. EXAM: CHEST  1 VIEW COMPARISON:  Chest XR, 06/11/2023.  CT chest, 03/20/2023. FINDINGS: Cardiac silhouette is within normal limits. Aortic arch atherosclerosis. Lungs are well inflated. No focal consolidation or mass. No pleural effusion or pneumothorax. Chronic-appearing RIGHT distal clavicular and multilevel RIGHT posterior rib fracture abnormalities. No acute displaced fracture. IMPRESSION: No acute cardiopulmonary process. Electronically Signed   By: Thom Hall M.D.   On: 03/17/2024 14:11       Shenetta Schnackenberg M.D. [facility] 03/18/2024, 11:12 AM  Available via Epic secure chat 7am-7pm After 7 pm, please refer to night coverage provider listed on amion.

## 2024-03-18 NOTE — Progress Notes (Signed)
 [facility] Progress Note   Assessment/ Plan:   # Acute kidney injury on CKD 3b - bladder outlet obstruction, maybe an element of pre-renal insults - no sig improvement with Foley placement - has declined dialysis and he is a poor candidate  - agree with comfort-focused care   # Hyperkalemia - better s/p temporizing measures - got another dose of lokelma today   # Metabolic acidosis - on bicarb gtt, improving   # Acute encephalopathy - likely uremic contributing   # HTN  - Acceptable on current regimen    # CKD stage 3b - Note baseline Cr 1.6 - 2.0   Disposition: no sig improvement with Foley, no dialysis.  Recommend comfort- focused care.  Lokelma and other temporizing measures can be repeated if desired.  [facility] will sign off.  Please call with questions  Subjective:    Seen in room.  No significant improvement in the BUN/Cr since placing the Foley.  K slightly better overall, received lokelma.  Still on bicarb gtt.     Objective:   BP (!) 171/90 (BP Location: Left Arm)   Pulse 62   Temp 98.3 F (36.8 C) (Oral)   Resp 15   Ht 6' (1.829 m)   Wt 62 kg   SpO2 97%   BMI 18.54 kg/m   Intake/Output Summary (Last 24 hours) at 03/18/2024 1410 Last data filed at 03/18/2024 0456 Gross per 24 hour  Intake 837.81 ml  Output 20 ml  Net 817.81 ml   Weight change:   Physical Exam: Hzw:zoizmob, lying in bed, + myoclonic jerking CVS: RRR Resp: clear Abd: soft Ext: poor skin turgor GU: + rosy colored urine in Foley  Imaging: CT ABDOMEN PELVIS WO CONTRAST Result Date: 03/17/2024 CLINICAL DATA:  New onset renal failure. EXAM: CT ABDOMEN AND PELVIS WITHOUT CONTRAST TECHNIQUE: Multidetector CT imaging of the abdomen and pelvis was performed following the standard protocol without IV contrast. RADIATION DOSE REDUCTION: This exam was performed according to the departmental dose-optimization program which includes automated exposure control, adjustment of  the mA and/or kV according to patient size and/or use of iterative reconstruction technique. COMPARISON:  CT 07/10/2023 FINDINGS: Lower chest: Lower lobe bronchial thickening. Trace bilateral pleural effusions with adjacent atelectasis. The heart is normal in size. Hepatobiliary: No evidence of focal liver abnormality on this unenhanced exam. Gallstone is well as high density material within the gallbladder lumen. No pericholecystic inflammation. No biliary dilatation. Pancreas: No ductal dilatation or inflammation. Spleen: Normal in size without focal abnormality. Adrenals/Urinary Tract: No adrenal nodule. Moderate bilateral hydroureteronephrosis. Ureters are dilated to the bladder insertion. No renal or ureteral calculi. Simple cyst in the left kidney. The urinary bladder is decompressed by Foley catheter. Elevation of the Foley balloon in the bladder may be due to wall thickening or heterogeneous contents. Stomach/Bowel: The stomach is decompressed. No small bowel obstruction or inflammation. Proximal colon is decompressed. Moderate stool within the left colon. Sigmoid colonic diverticula without diverticulitis. Vascular/Lymphatic: Aortic atherosclerosis. No aortic aneurysm. No bulky lymphadenopathy. Reproductive: Enlarged prostate. Other: Generalized retroperitoneal stranding with trace fluid. No free air. Mild generalized body wall edema. Musculoskeletal: Severe L1 compression fracture has progressive loss of height from January. Severe L3 compression fracture is unchanged. Mild superior endplate compression fracture of L5 is new from prior. Pronounced osteoporosis. Remote bilateral rib fractures. IMPRESSION: 1. Moderate bilateral hydroureteronephrosis. Ureters are dilated to the bladder insertion. No renal or ureteral calculi. 2. The urinary bladder is decompressed by Foley catheter. Elevation of  the Foley balloon in the bladder may be due to wall thickening or heterogeneous contents. 3. Enlarged prostate. 4.  Severe L1 compression fracture has progressive loss of height from January. Severe L3 compression fracture is chronic. Mild superior endplate compression fracture of L5 is new from prior. 5. Cholelithiasis without gallbladder inflammation. 6. Trace bilateral pleural effusions with adjacent atelectasis. Aortic Atherosclerosis (ICD10-I70.0). Electronically Signed   By: Andrea Gasman M.D.   On: 03/17/2024 16:52   CT Head Wo Contrast Result Date: 03/17/2024 CLINICAL DATA:  Altered mental status.  Weakness. EXAM: CT HEAD WITHOUT CONTRAST TECHNIQUE: Contiguous axial images were obtained from the base of the skull through the vertex without intravenous contrast. RADIATION DOSE REDUCTION: This exam was performed according to the departmental dose-optimization program which includes automated exposure control, adjustment of the mA and/or kV according to patient size and/or use of iterative reconstruction technique. COMPARISON:  02/08/2024 FINDINGS: Brain: No intracranial hemorrhage, mass effect, or midline shift. Stable atrophy and chronic small vessel ischemia. No hydrocephalus. The basilar cisterns are patent. No evidence of territorial infarct or acute ischemia. No extra-axial or intracranial fluid collection. Vascular: Atherosclerosis of skullbase vasculature without hyperdense vessel or abnormal calcification. Skull: No fracture or focal lesion. Sinuses/Orbits: No acute finding. Other: None. IMPRESSION: 1. No acute intracranial abnormality. 2. Stable atrophy and chronic small vessel ischemia. Electronically Signed   By: Andrea Gasman M.D.   On: 03/17/2024 14:30   DG Chest 1 View Result Date: 03/17/2024 CLINICAL DATA:  ams Patient BIB [facility] from home. Beginning last night has had generalized weakness. EXAM: CHEST  1 VIEW COMPARISON:  Chest XR, 06/11/2023.  CT chest, 03/20/2023. FINDINGS: Cardiac silhouette is within normal limits. Aortic arch atherosclerosis. Lungs are well inflated. No focal consolidation or  mass. No pleural effusion or pneumothorax. Chronic-appearing RIGHT distal clavicular and multilevel RIGHT posterior rib fracture abnormalities. No acute displaced fracture. IMPRESSION: No acute cardiopulmonary process. Electronically Signed   By: Thom Hall M.D.   On: 03/17/2024 14:11    Labs: BMET Recent Labs  Lab 03/17/24 1258 03/17/24 1448 03/17/24 1743 03/18/24 0623  NA 131* 130* 133* 133*  K 6.1* 6.3* 4.9 5.7*  CL 96* 103 90* 94*  CO2 13*  --  25 21*  GLUCOSE 106* 95 467* 84  BUN 137* >130* 121* 137*  CREATININE 15.90* 18.00* 13.90* 16.30*  CALCIUM  8.5*  --  7.4* 8.3*   CBC Recent Labs  Lab 03/17/24 1258 03/17/24 1448 03/18/24 0623  WBC 7.3  --  8.8  HGB 11.2* 10.5* 10.3*  HCT 35.2* 31.0* 31.2*  MCV 89.3  --  87.2  PLT 208  --  184    Medications:     amLODipine  5 mg Oral Daily   Chlorhexidine  Gluconate Cloth  6 each Topical Daily   heparin   5,000 Units Subcutaneous Q8H   tamsulosin  0.4 mg Oral Daily    Almarie Bonine, MD 03/18/2024, 2:10 PM

## 2024-03-18 NOTE — Consult Note (Signed)
 WOC Nurse Consult Note: Reason for Consult: Stage 2 on left hip   Admitted from home with AMS and weakness Wound type: Stage 2 Pressure Injury; 100% pink and clean Pressure Injury POA: Yes Measurement: see nursing flow sheets Wound bed: see above  Drainage (amount, consistency, odor) see nursing flow sheets Periwound: intact  Dressing procedure/placement/frequency:  Apply single layer of xeroform and top with foam. Change every other day.   Turn and reposition per hospital policy   Re consult if needed, will not follow at this time. Thanks  Sani Loiseau [employer], RN,CWOCN, CNS, [employer] [phone]

## 2024-03-18 NOTE — Progress Notes (Signed)
   03/18/24 0400  Urine Measurement/Characteristics  Urine (mL) 20 mL  Urinary Incontinence No  Urine Color Red  Urine Appearance Clear  Time patient last voided or urinary catheter emptied 0436  Urinary Interventions Bladder scan  Bladder Scan Volume (mL) 0 mL   Pt's urinary output was 20 ml. Bladder scan performed and read 0 ml. On call NP notified of findings. BP 151/85 HR 59

## 2024-03-19 DIAGNOSIS — R4182 Altered mental status, unspecified: Secondary | ICD-10-CM | POA: Diagnosis not present

## 2024-03-19 DIAGNOSIS — E875 Hyperkalemia: Secondary | ICD-10-CM | POA: Diagnosis not present

## 2024-03-19 DIAGNOSIS — R52 Pain, unspecified: Secondary | ICD-10-CM | POA: Diagnosis not present

## 2024-03-19 DIAGNOSIS — Z515 Encounter for palliative care: Secondary | ICD-10-CM | POA: Diagnosis not present

## 2024-03-19 DIAGNOSIS — C679 Malignant neoplasm of bladder, unspecified: Secondary | ICD-10-CM | POA: Diagnosis not present

## 2024-03-19 DIAGNOSIS — N179 Acute kidney failure, unspecified: Secondary | ICD-10-CM | POA: Diagnosis not present

## 2024-03-19 DIAGNOSIS — Z7189 Other specified counseling: Secondary | ICD-10-CM

## 2024-03-19 LAB — URINE CULTURE: Culture: NO GROWTH

## 2024-03-19 MED ORDER — ACETAMINOPHEN 650 MG RE SUPP
650.0000 mg | Freq: Four times a day (QID) | RECTAL | Status: DC | PRN
  Administered 2024-03-20 – 2024-03-22 (×4): 650 mg via RECTAL
  Filled 2024-03-19 (×4): qty 1

## 2024-03-19 NOTE — Progress Notes (Signed)
 [facility]                                                                              Richard Stanley, is a 88 y.o. male, DOB - 10-Oct-1935, FMW:[id number] Admit date - 03/17/2024    Outpatient Primary MD for the patient is McClanahan, Kyra, NP  LOS - 2  days  Chief Complaint  Patient presents with   Weakness       Brief summary   Patient is a 88 y.o. male with atrial fibrillation, hypertension, CKD stage III, COPD, dementia, depression, hyperlipidemia presented to ED via EMS from home. Family had reported to the EMS that he was not acting his self and was confused.  Due to altered mental status, patient was not able to provide good history.  He did report that he had no nausea vomiting or any diarrhea.   At baseline ambulates with a walker.  Per family, patient was very weak and not able to stand up on his own this morning.  EMS was concerned about UTI. Patient was not able to tell if he had any difficulty urinating.  On admission, Labs showed sodium 131, potassium 6.1, chloride 96, CO2 13, BUN 137, creatinine 15.9 Baseline creatinine 1.6 on 02/08/2024  Assessment & Plan    Severe AKI (acute kidney injury) superimposed on CKD stage IIIb with hyperkalemia, AG metabolic acidosis - Baseline creatinine 1.6 on 02/08/2024, on admission creatinine 15.9-> 18.0 - CT abdomen showed moderate bilateral hydroureteronephrosis, ureters are dilated to the bladder insertion, no renal or ureteral calculi.  Enlarged prostate. -Foley catheter placed, initially creatinine improved after Foley placement however trending up again to 16.3  - Urology consulted, patient has aggressive bladder CA, likely the cause of bladder outlet obstruction.  Patient and his family had declined workup, treatment or surgery in the past. -Discussed with urology, recommended to continue Foley.  If renal function did not improve, likely this would be terminal and palliative medicine consult for GOC. - Palliative  medicine following, GOC 10/10, comfort care     Severe hyperkalemia - Received albuterol  nebs, IV calcium  gluconate, sodium bicarb, Lokelma, received insulin/D50  - comfort care    Acute metabolic encephalopathy, myoclonic jerking movements likely due to uremia, #1  - Likely due to #1 - CT head showed no acute intracranial abnormality - Urine culture showed no growth - Comfort care    Atrial fibrillation, chronic, hypertension - Had history of cardioversion on 11/12/2021, follows Dr. Ladona  - HR in 50-60s, hold amiodarone  - Not on any anticoagulation - Comfort care       COPD (chronic obstructive pulmonary disease) (HCC) -Currently no wheezing    Anemia of chronic disease, normocytic - Baseline hemoglobin 13.0 on 02/08/2024,  - no active bleeding   History of aggressive urothelial carcinoma of bladder (HCC) - Now comfort care   Pressure Injury Documentation: POA Wound 03/17/24 2115 Pressure Injury Ischial tuberosity Left Stage 2 -  Partial thickness loss of dermis presenting as a shallow open injury with a red, pink wound bed without slough. (Active)  -Continue wound care  Underweight Estimated body mass index is 18.54 kg/m as  calculated from the following:   Height as of this encounter: 6' (1.829 m).   Weight as of this encounter: 62 kg.  Code Status: DNR DVT Prophylaxis:     Level of Care: Level of care: Progressive Family Communication: Updated patient's wife at the bedside Disposition Plan:      Remains inpatient appropriate:      Procedures:    Consultants:   Urology Nephrology Palliative medicine  Antimicrobials:   Anti-infectives (From admission, onward)    Start     Dose/Rate Route Frequency Ordered Stop   03/18/24 0900  cefTRIAXone  (ROCEPHIN ) 1 g in sodium chloride  0.9 % 100 mL IVPB  Status:  Discontinued        1 g 200 mL/hr over 30 Minutes Intravenous Every 24 hours 03/18/24 0800 03/18/24 1631          Medications      Subjective:    Richard Stanley was seen and examined today.  Awake, confused and having jerking movements.  Pain+ Wife at the bedside   Objective:   Vitals:   03/18/24 0400 03/18/24 0825 03/18/24 1432 03/18/24 2020  BP: (!) 151/85 (!) 171/90 (!) 146/63 132/66  Pulse: (!) 59 62 65 65  Resp: 15  17 20   Temp: 98.2 F (36.8 C) 98.3 F (36.8 C) 97.8 F (36.6 C) 98 F (36.7 C)  TempSrc: Oral Oral Oral Oral  SpO2: 100% 97% 96% 97%  Weight:      Height:        Intake/Output Summary (Last 24 hours) at 03/19/2024 1300 Last data filed at 03/18/2024 1646 Gross per 24 hour  Intake 690.46 ml  Output --  Net 690.46 ml     Wt Readings from Last 3 Encounters:  03/17/24 62 kg  02/08/24 61.7 kg  10/29/23 63.6 kg   Physical Exam General: Awake, appears more comfortable today cardiovascular: S1 S2 clear, RRR.  Respiratory: CTAB Gastrointestinal: Soft, nontender, nondistended, NBS Ext: no pedal edema bilaterally Psych: Still confused    Data Reviewed:  I have personally reviewed following labs    CBC Lab Results  Component Value Date   WBC 8.8 03/18/2024   RBC 3.58 (L) 03/18/2024   HGB 10.3 (L) 03/18/2024   HCT 31.2 (L) 03/18/2024   MCV 87.2 03/18/2024   MCH 28.8 03/18/2024   PLT 184 03/18/2024   MCHC 33.0 03/18/2024   RDW 13.5 03/18/2024   LYMPHSABS 0.9 02/08/2024   MONOABS 0.8 02/08/2024   EOSABS 0.1 02/08/2024   BASOSABS 0.0 02/08/2024     Last metabolic panel Lab Results  Component Value Date   NA 133 (L) 03/18/2024   K 5.7 (H) 03/18/2024   CL 94 (L) 03/18/2024   CO2 21 (L) 03/18/2024   BUN 137 (H) 03/18/2024   CREATININE 16.30 (H) 03/18/2024   GLUCOSE 84 03/18/2024   GFRNONAA 3 (L) 03/18/2024   GFRAA 44 (L) 02/22/2018   CALCIUM  8.3 (L) 03/18/2024   PHOS 2.3 (L) 03/05/2022   PROT 6.0 (L) 02/08/2024   ALBUMIN 3.8 02/08/2024   BILITOT 0.3 02/08/2024   ALKPHOS 116 02/08/2024   AST 22 02/08/2024   ALT 15 02/08/2024   ANIONGAP 19 (H) 03/18/2024    CBG (last 3)   Recent Labs    03/17/24 2203  GLUCAP 100*      Coagulation Profile: No results for input(s): INR, PROTIME in the last 168 hours.   Radiology Studies: I have personally reviewed the imaging studies  CT ABDOMEN PELVIS WO CONTRAST  Result Date: 03/17/2024 CLINICAL DATA:  New onset renal failure. EXAM: CT ABDOMEN AND PELVIS WITHOUT CONTRAST TECHNIQUE: Multidetector CT imaging of the abdomen and pelvis was performed following the standard protocol without IV contrast. RADIATION DOSE REDUCTION: This exam was performed according to the departmental dose-optimization program which includes automated exposure control, adjustment of the mA and/or kV according to patient size and/or use of iterative reconstruction technique. COMPARISON:  CT 07/10/2023 FINDINGS: Lower chest: Lower lobe bronchial thickening. Trace bilateral pleural effusions with adjacent atelectasis. The heart is normal in size. Hepatobiliary: No evidence of focal liver abnormality on this unenhanced exam. Gallstone is well as high density material within the gallbladder lumen. No pericholecystic inflammation. No biliary dilatation. Pancreas: No ductal dilatation or inflammation. Spleen: Normal in size without focal abnormality. Adrenals/Urinary Tract: No adrenal nodule. Moderate bilateral hydroureteronephrosis. Ureters are dilated to the bladder insertion. No renal or ureteral calculi. Simple cyst in the left kidney. The urinary bladder is decompressed by Foley catheter. Elevation of the Foley balloon in the bladder may be due to wall thickening or heterogeneous contents. Stomach/Bowel: The stomach is decompressed. No small bowel obstruction or inflammation. Proximal colon is decompressed. Moderate stool within the left colon. Sigmoid colonic diverticula without diverticulitis. Vascular/Lymphatic: Aortic atherosclerosis. No aortic aneurysm. No bulky lymphadenopathy. Reproductive: Enlarged prostate. Other: Generalized retroperitoneal stranding  with trace fluid. No free air. Mild generalized body wall edema. Musculoskeletal: Severe L1 compression fracture has progressive loss of height from January. Severe L3 compression fracture is unchanged. Mild superior endplate compression fracture of L5 is new from prior. Pronounced osteoporosis. Remote bilateral rib fractures. IMPRESSION: 1. Moderate bilateral hydroureteronephrosis. Ureters are dilated to the bladder insertion. No renal or ureteral calculi. 2. The urinary bladder is decompressed by Foley catheter. Elevation of the Foley balloon in the bladder may be due to wall thickening or heterogeneous contents. 3. Enlarged prostate. 4. Severe L1 compression fracture has progressive loss of height from January. Severe L3 compression fracture is chronic. Mild superior endplate compression fracture of L5 is new from prior. 5. Cholelithiasis without gallbladder inflammation. 6. Trace bilateral pleural effusions with adjacent atelectasis. Aortic Atherosclerosis (ICD10-I70.0). Electronically Signed   By: Andrea Gasman M.D.   On: 03/17/2024 16:52   CT Head Wo Contrast Result Date: 03/17/2024 CLINICAL DATA:  Altered mental status.  Weakness. EXAM: CT HEAD WITHOUT CONTRAST TECHNIQUE: Contiguous axial images were obtained from the base of the skull through the vertex without intravenous contrast. RADIATION DOSE REDUCTION: This exam was performed according to the departmental dose-optimization program which includes automated exposure control, adjustment of the mA and/or kV according to patient size and/or use of iterative reconstruction technique. COMPARISON:  02/08/2024 FINDINGS: Brain: No intracranial hemorrhage, mass effect, or midline shift. Stable atrophy and chronic small vessel ischemia. No hydrocephalus. The basilar cisterns are patent. No evidence of territorial infarct or acute ischemia. No extra-axial or intracranial fluid collection. Vascular: Atherosclerosis of skullbase vasculature without hyperdense  vessel or abnormal calcification. Skull: No fracture or focal lesion. Sinuses/Orbits: No acute finding. Other: None. IMPRESSION: 1. No acute intracranial abnormality. 2. Stable atrophy and chronic small vessel ischemia. Electronically Signed   By: Andrea Gasman M.D.   On: 03/17/2024 14:30   DG Chest 1 View Result Date: 03/17/2024 CLINICAL DATA:  ams Patient BIB [facility] from home. Beginning last night has had generalized weakness. EXAM: CHEST  1 VIEW COMPARISON:  Chest XR, 06/11/2023.  CT chest, 03/20/2023. FINDINGS: Cardiac silhouette is within normal limits. Aortic arch atherosclerosis. Lungs are well inflated. No  focal consolidation or mass. No pleural effusion or pneumothorax. Chronic-appearing RIGHT distal clavicular and multilevel RIGHT posterior rib fracture abnormalities. No acute displaced fracture. IMPRESSION: No acute cardiopulmonary process. Electronically Signed   By: Thom Hall M.D.   On: 03/17/2024 14:11       Alliana Mcauliff M.D. [facility] 03/19/2024, 1:00 PM  Available via Epic secure chat 7am-7pm After 7 pm, please refer to night coverage provider listed on amion.

## 2024-03-19 NOTE — Plan of Care (Signed)
  Problem: Pain Managment: Goal: General experience of comfort will improve and/or be controlled Outcome: Progressing   Problem: Pain Management: Goal: Satisfaction with pain management regimen will improve Outcome: Progressing

## 2024-03-19 NOTE — Progress Notes (Signed)
 Richard Stanley [address] [facility] Liaison Note:   Referral received for [facility].    At this time [facility] does not have any beds to offer. We will follow up for evaluation of appropriateness once a bed becomes available.    Thank you for the opportunity to participate in this patient's plan of care.    Nat Babe, BSN, [employer] [phone]

## 2024-03-19 NOTE — Progress Notes (Signed)
 Chaplain attempted to provide follow up care to pt's spouse twice, but she declined chaplain visit each time. Chaplain team is available to provide support, if urgent need arises please contact chaplains via Chaplain team pager [phone]. Chaplain Adelina Baptist, [place]   03/19/24 1200  Spiritual Encounters  Type of Visit Follow up  Care provided to: Family  Referral source Chaplain team  Reason for visit End-of-life  OnCall Visit Yes

## 2024-03-19 NOTE — Progress Notes (Signed)
 Daily Progress Note   Patient Name: Richard Stanley       Date: 03/19/2024 DOB: [birth year]  Age: 88 y.o. MRN#: [id number] Attending Physician: Davia Nydia POUR, MD Primary Care Physician: McClanahan, Kyra, NP Admit Date: 03/17/2024 Length of Stay: 2 days  Reason for Consultation/Follow-up: Establishing goals of care  Subjective:   CC: Patient notes still having pain.  Following up regarding complex medical decision making.  Subjective:  Reviewed EMR including recent documentation from hospitalist and spiritual care.  At time of EMR review on past 24 hours patient has received as needed IV Dilaudid 1 mg x 1 dose. Discussed care with bedside RN for medical updates.  Presented to bedside to see patient.  Patient awake and able to answer some simple questions.  Wife present at bedside.  Again reviewed plan for comfort focused care at this time.  Wife supporting of this.  She notes daughter is trying to come to terms with things though still angry.  Normalized feelings of grief.  Discussed honoring who patient was as a person and what his wife knows would be desired with focusing on comfort at end-of-life.  Spent time providing emotional support via active listening. Did discuss symptom management.  Patient hard of hearing though able to state he still in pain.  Wife feels the IV pain medicines helped more though not quite enough.  Patient also still having myoclonic jerking.  Noted would discuss with RN providing higher dose of pain medication since patient noting pain uncontrolled.  Also discussed with wife hide to have inpatient hospice transfer.  Spent time explaining inpatient hospice and focusing on end-of-life care there.  Wife specifically requested that patient remain here in [place].  Did discuss inpatient hospice in [place] was [facility] which she is familiar with.  Agreeing with referral for evaluation.  Again discussed we will continue comfort focused measures here as long as  appropriate until/if accepted to [facility].  All questions answered at that time.  Noted palliative medicine team continue to follow with patient's medical journey.  Discussed care with hospitalist, RN, TOC, and ACC liaison to coordinate care.  Review of Systems Pain  Objective:   Vital Signs:  BP 132/66 (BP Location: Right Arm)   Pulse 65   Temp 98 F (36.7 C) (Oral)   Resp 20   Ht 6' (1.829 m)   Wt 62 kg   SpO2 97%   BMI 18.54 kg/m   Physical Exam: General: Ill-appearing, awake though lethargic, hard of hearing, myoclonic jerking present Cardiovascular: RRR Respiratory: no increased work of breathing noted, not in respiratory distress Abdomen: not distended Skin: Multiple ecchymoses on upper extremities bilaterally Neuro: Awake though lethargic at times  Assessment & Plan:   Assessment: Patient is a 88 year old male with past medical history of atrial fibrillation, hypertension, CKD stage III, COPD, dementia, depression, hyperlipidemia, and aggressive bladder cancer who was admitted on 03/17/2024 for management of AMS and generalized weakness.  Patient has history of TURBT by urology in March 2025 which showed concern and was recommended for rebiopsy as well as radical cystectomy though patient and wife declined this intervention; noted they wanted to focus on quality of life with his advanced age.  Since admission, patient receiving management for acute renal failure superimposed on CKD, metabolic acidosis, severe hyperkalemia, acute metabolic encephalopathy and myoclonic jerking likely secondary to uremia from acute renal failure, and possible urinary tract infection.  Urology, neurology, and nephrology consulted for recommendations.  Palliative medicine team consulted to  assist with complex medical decision making.   Recommendations/Plan: # Complex medical decision making/goals of care:   -Patient unable to participate in medical decision making secondary to mental  status.                - Discussed care with patient's wife at bedside today as per HPI.  Had transitioned to comfort focused care on 03/18/2024 as per wife's wishes for the patient.  Continuing comfort focused care at this time.  Discussed inpatient hospice referral and evaluation which wife is agreeing with, request to stay in [facility] for [facility].  Discussed with TOC and ACC for evaluation.  Palliative medicine team continuing to follow along with patient's medical journey.                - Have discontinued interventions that are no longer focused on comfort such as IV fluids, imaging, or lab work.  Concerning on symptom management of pain, dyspnea, and agitation in the setting of end-of-life care.                  Code Status: Do not attempt resuscitation (DNR) - Comfort care   # Symptom management Patient is receiving these palliative interventions for symptom management with an intent to improve quality of life.                   -Pain/Dyspnea, acute in the setting of end-of-life care                               -Continue IV Dilaudid 0.5-2 mg IV every 30 minutes as needed.  Continue to adjust based on patient's symptom burden.  If patient needing frequent dosing, may need to consider continuous infusion.                  -Anxiety/agitation, in the setting of end-of-life care                               -Continue as needed Ativan.  Continue to adjust based on patient's symptom burden.                  -Secretions, in the setting of end-of-life care                               -Continue as needed glycopyrrolate.    # Psycho-social/Spiritual Support:  - Support System: Wife, adopted daughter, son who lives in [place]  - Desire for further Chaplain support:yes  # Discharge Planning: To Be Determined  - Continuing comfort focused care at this time.  Either anticipated in-hospital death or transfer to inpatient hospice.  Discussed with: Patient, patient's wife, RN, TOC, ACC  liaison, hospitalist  Thank you for allowing the palliative care team to participate in the care [name].  Tinnie Radar, DO Palliative Care Provider PMT # [phone]  If patient remains symptomatic despite maximum doses, please call PMT at [phone] between 0700 and 1900. Outside of these hours, please call attending, as PMT does not have night coverage.

## 2024-03-20 DIAGNOSIS — N179 Acute kidney failure, unspecified: Secondary | ICD-10-CM | POA: Diagnosis not present

## 2024-03-20 DIAGNOSIS — R4589 Other symptoms and signs involving emotional state: Secondary | ICD-10-CM

## 2024-03-20 DIAGNOSIS — R52 Pain, unspecified: Secondary | ICD-10-CM | POA: Diagnosis not present

## 2024-03-20 DIAGNOSIS — E875 Hyperkalemia: Secondary | ICD-10-CM | POA: Diagnosis not present

## 2024-03-20 DIAGNOSIS — R4182 Altered mental status, unspecified: Secondary | ICD-10-CM | POA: Diagnosis not present

## 2024-03-20 DIAGNOSIS — C679 Malignant neoplasm of bladder, unspecified: Secondary | ICD-10-CM | POA: Diagnosis not present

## 2024-03-20 DIAGNOSIS — Z515 Encounter for palliative care: Secondary | ICD-10-CM | POA: Diagnosis not present

## 2024-03-20 NOTE — Progress Notes (Signed)
 Daily Progress Note   Patient Name: Richard Stanley       Date: 03/20/2024 DOB: 11-Jun-1935  Age: 88 y.o. MRN#: [id number] Attending Physician: Davia Nydia POUR, MD Primary Care Physician: McClanahan, Kyra, NP Admit Date: 03/17/2024 Length of Stay: 3 days  Reason for Consultation/Follow-up: Establishing goals of care  Subjective:   CC: Patient notes pain improved.  Following up regarding complex medical decision making.  Subjective:  Reviewed EMR including recent documentation from hospitalist.  [facility] liaison planning to see patient today for inpatient hospice evaluation.  At time of EMR review in past 24 hours patient has received as needed IV Dilaudid 1 mg x 2 doses and 2 mg x 1 dose.  Patient also required as needed Zofran  4 mg x 1 dose.  Presented to bedside to see patient.  Patient's wife sitting at bedside.  Patient lethargic though easily awakened.  Patient able to answer some simple questions with increased volume of voice.  Patient does feel his pain is better managed with medications.  Wife agrees with this.  Patient having less myoclonic jerking currently.  Wife noted patient having worsening nausea this morning.  Patient having minimal to no urine output.  Continuing comfort focused care at this time.  Spent time providing emotional support via active listening.  Also normalized aspects of patient's continued deterioration in status and signs of end-of-life to be cognizant of.  Wife voiced appreciation for discussion.  Noted palliative medicine team continue to follow along with patient's medical journey.  Objective:   Vital Signs:  BP 124/63 (BP Location: Right Arm)   Pulse 70   Temp 98.5 F (36.9 C) (Oral)   Resp 18   Ht 6' (1.829 m)   Wt 62 kg   SpO2 94%   BMI 18.54 kg/m   Physical Exam: General: Ill-appearing, lethargic though will awake, hard of hearing, myoclonic jerking currently improved though slightly present Cardiovascular: RRR Respiratory: no increased work of  breathing noted, not in respiratory distress Abdomen: not distended Skin: Multiple ecchymoses on upper extremities bilaterally Neuro: Awake though lethargic at times  Assessment & Plan:   Assessment: Patient is a 88 year old male with past medical history of atrial fibrillation, hypertension, CKD stage III, COPD, dementia, depression, hyperlipidemia, and aggressive bladder cancer who was admitted on 03/17/2024 for management of AMS and generalized weakness.  Patient has history of TURBT by urology in March 2025 which showed concern and was recommended for rebiopsy as well as radical cystectomy though patient and wife declined this intervention; noted they wanted to focus on quality of life with his advanced age.  Since admission, patient receiving management for acute renal failure superimposed on CKD, metabolic acidosis, severe hyperkalemia, acute metabolic encephalopathy and myoclonic jerking likely secondary to uremia from acute renal failure, and possible urinary tract infection.  Urology, neurology, and nephrology consulted for recommendations.  Palliative medicine team consulted to assist with complex medical decision making.   Recommendations/Plan: # Complex medical decision making/goals of care:   -Patient unable to participate in medical decision making secondary to mental status.                - Discussed care with patient's wife at bedside today as per HPI.  Had transitioned to comfort focused care on 03/18/2024 as per wife's wishes for the patient.  Continuing comfort focused care at this time.  Discussed inpatient hospice referral and evaluation which wife is agreeing with, request to stay in [facility] for [facility].  Discussed with TOC  and ACC who was planning to evaluate patient today.  Palliative medicine team continuing to follow along with patient's medical journey.                - Have discontinued interventions that are no longer focused on comfort such as IV fluids, imaging,  or lab work.  Concerning on symptom management of pain, dyspnea, and agitation in the setting of end-of-life care.                  Code Status: Do not attempt resuscitation (DNR) - Comfort care   # Symptom management Patient is receiving these palliative interventions for symptom management with an intent to improve quality of life.                   -Pain/Dyspnea, acute in the setting of end-of-life care                               -Continue IV Dilaudid 0.5-2 mg IV every 30 minutes as needed.  Continue to adjust based on patient's symptom burden.  If patient needing frequent dosing, may need to consider continuous infusion.                  -Anxiety/agitation, in the setting of end-of-life care                               -Continue as needed Ativan.  Continue to adjust based on patient's symptom burden.                  -Secretions, in the setting of end-of-life care                               -Continue as needed glycopyrrolate.    # Psycho-social/Spiritual Support:  - Support System: Wife, adopted daughter, son who lives in [place]  - Desire for further Chaplain support:yes  # Discharge Planning: To Be Determined  - Continuing comfort focused care at this time.  Either anticipated in-hospital death or transfer to inpatient hospice.  Discussed with: Patient, patient's wife, RN, TOC, ACC liaison, hospitalist  Thank you for allowing the palliative care team to participate in the care Richard Stanley.  Tinnie Radar, DO Palliative Care Provider PMT # [phone]  If patient remains symptomatic despite maximum doses, please call PMT at [phone] between 0700 and 1900. Outside of these hours, please call attending, as PMT does not have night coverage.  Personally spent 35 minutes in patient care including extensive chart review (labs, imaging, progress/consult notes, vital signs), medically appropraite exam, discussed with treatment team, education to patient, family, and staff,  documenting clinical information, medication review and management, coordination of care, and available advanced directive documents.

## 2024-03-20 NOTE — Progress Notes (Signed)
 Richard Stanley [facility] Liaison Note:   Received request from [facility] for family interest in [facility]. Spoke with wife at bedside to initiate education related to hospice philosophy, services, and team approach to care. Wife verbalized understanding of information given.   Wife verbalized that she is uncertain what she wants to do. Her daughter would like to bring patient home. They will be moving into a new home in [place] in 3 days. Wife feels if patient goes to [facility] she won't be able to visit because of the commute. Liaison explained what hospice services could look like in a home setting per wife request. Wife plans to see how patient does over the next few days before making decision.  Liaison informed medical team of wife's concerns. Contact information left with wife and she was encouraged to call if she has any questions or concerns.   Thank you for the opportunity to participate in this patient's care.    Nat Babe, BSN, [facility] Liaison [phone]

## 2024-03-20 NOTE — Progress Notes (Signed)
 [facility]                                                                              Richard Stanley, is a 88 y.o. male, DOB - 01/13/1936, FMW:[id number] Admit date - 03/17/2024    Outpatient Primary MD for the patient is McClanahan, Kyra, NP  LOS - 3  days  Chief Complaint  Patient presents with   Weakness       Brief summary   Patient is a 88 y.o. male with atrial fibrillation, hypertension, CKD stage III, COPD, dementia, depression, hyperlipidemia presented to ED via EMS from home. Family had reported to the EMS that he was not acting his self and was confused.  Due to altered mental status, patient was not able to provide good history.  He did report that he had no nausea vomiting or any diarrhea.   At baseline ambulates with a walker.  Per family, patient was very weak and not able to stand up on his own this morning.  EMS was concerned about UTI. Patient was not able to tell if he had any difficulty urinating.  On admission, Labs showed sodium 131, potassium 6.1, chloride 96, CO2 13, BUN 137, creatinine 15.9 Baseline creatinine 1.6 on 02/08/2024  Assessment & Plan    Severe AKI (acute kidney injury) superimposed on CKD stage IIIb with hyperkalemia, AG metabolic acidosis - Baseline creatinine 1.6 on 02/08/2024, on admission creatinine 15.9-> 18.0 - CT abdomen showed moderate bilateral hydroureteronephrosis, ureters are dilated to the bladder insertion, no renal or ureteral calculi.  Enlarged prostate. -Foley catheter placed, initially creatinine improved after Foley placement however trending up again to 16.3  - Urology consulted, patient has aggressive bladder CA, likely the cause of bladder outlet obstruction.  Patient and his family had declined workup, treatment or surgery in the past. -Discussed with urology, recommended to continue Foley.  If renal function did not improve, likely this would be terminal and palliative medicine consult for GOC. - Palliative  medicine following, GOC 10/10, comfort care     Severe hyperkalemia - Received albuterol  nebs, IV calcium  gluconate, sodium bicarb, Lokelma, received insulin/D50  - comfort care    Acute metabolic encephalopathy, myoclonic jerking movements likely due to uremia, #1  - Likely due to #1 - CT head showed no acute intracranial abnormality - Urine culture showed no growth - Comfort care    Atrial fibrillation, chronic, hypertension - Had history of cardioversion on 11/12/2021, follows Dr. Ladona  - HR in 50-60s, hold amiodarone  - Not on any anticoagulation - Comfort care       COPD (chronic obstructive pulmonary disease) (HCC) -Currently no wheezing    Anemia of chronic disease, normocytic - Baseline hemoglobin 13.0 on 02/08/2024,  - no active bleeding   History of aggressive urothelial carcinoma of bladder (HCC) - Now comfort care   Pressure Injury Documentation: POA Wound 03/17/24 2115 Pressure Injury Ischial tuberosity Left Stage 2 -  Partial thickness loss of dermis presenting as a shallow open injury with a red, pink wound bed without slough. (Active)  -Continue wound care  Underweight Estimated body mass index is 18.54 kg/m as  calculated from the following:   Height as of this encounter: 6' (1.829 m).   Weight as of this encounter: 62 kg.  Code Status: DNR DVT Prophylaxis:     Level of Care: Level of care: Progressive Family Communication: Updated patient's wife at the bedside today Disposition Plan:      Remains inpatient appropriate:    Procedures:    Consultants:   Urology Nephrology Palliative medicine  Antimicrobials:   Anti-infectives (From admission, onward)    Start     Dose/Rate Route Frequency Ordered Stop   03/18/24 0900  cefTRIAXone  (ROCEPHIN ) 1 g in sodium chloride  0.9 % 100 mL IVPB  Status:  Discontinued        1 g 200 mL/hr over 30 Minutes Intravenous Every 24 hours 03/18/24 0800 03/18/24 1631           Medications      Subjective:   Richard Stanley was seen and examined today.  Alert and awake, pain controlled, jerking movements somewhat better.  Wife at the bedside.  Appears comfortable, no new issues overnight. Objective:   Vitals:   03/18/24 2020 03/19/24 1821 03/20/24 0621 03/20/24 0625  BP: 132/66 127/62 124/63   Pulse: 65 75 70   Resp: 20 20 18    Temp: 98 F (36.7 C) 98.1 F (36.7 C) 98.5 F (36.9 C)   TempSrc: Oral Oral Oral   SpO2:  91% (!) 88% 94%  Weight:      Height:        Intake/Output Summary (Last 24 hours) at 03/20/2024 1113 Last data filed at 03/20/2024 0400 Gross per 24 hour  Intake 10 ml  Output 125 ml  Net -115 ml     Wt Readings from Last 3 Encounters:  03/17/24 62 kg  02/08/24 61.7 kg  10/29/23 63.6 kg    Physical Exam General: Alert and awake, NAD, appears comfortable Cardiovascular: S1 S2 clear, RRR.  Respiratory: CTAB Gastrointestinal: Soft, nontender, nondistended, NBS Ext: no pedal edema bilaterally Psych: Still confused     Data Reviewed:  I have personally reviewed following labs    CBC Lab Results  Component Value Date   WBC 8.8 03/18/2024   RBC 3.58 (L) 03/18/2024   HGB 10.3 (L) 03/18/2024   HCT 31.2 (L) 03/18/2024   MCV 87.2 03/18/2024   MCH 28.8 03/18/2024   PLT 184 03/18/2024   MCHC 33.0 03/18/2024   RDW 13.5 03/18/2024   LYMPHSABS 0.9 02/08/2024   MONOABS 0.8 02/08/2024   EOSABS 0.1 02/08/2024   BASOSABS 0.0 02/08/2024     Last metabolic panel Lab Results  Component Value Date   NA 133 (L) 03/18/2024   K 5.7 (H) 03/18/2024   CL 94 (L) 03/18/2024   CO2 21 (L) 03/18/2024   BUN 137 (H) 03/18/2024   CREATININE 16.30 (H) 03/18/2024   GLUCOSE 84 03/18/2024   GFRNONAA 3 (L) 03/18/2024   GFRAA 44 (L) 02/22/2018   CALCIUM  8.3 (L) 03/18/2024   PHOS 2.3 (L) 03/05/2022   PROT 6.0 (L) 02/08/2024   ALBUMIN 3.8 02/08/2024   BILITOT 0.3 02/08/2024   ALKPHOS 116 02/08/2024   AST 22 02/08/2024    ALT 15 02/08/2024   ANIONGAP 19 (H) 03/18/2024    CBG (last 3)  Recent Labs    03/17/24 2203  GLUCAP 100*      Coagulation Profile: No results for input(s): INR, PROTIME in the last 168 hours.   Radiology Studies: I have personally reviewed the imaging studies  No  results found.      Nydia Distance M.D. [facility] 03/20/2024, 11:13 AM  Available via Epic secure chat 7am-7pm After 7 pm, please refer to night coverage provider listed on amion.

## 2024-03-21 DIAGNOSIS — N179 Acute kidney failure, unspecified: Secondary | ICD-10-CM | POA: Diagnosis not present

## 2024-03-21 DIAGNOSIS — R4182 Altered mental status, unspecified: Secondary | ICD-10-CM | POA: Diagnosis not present

## 2024-03-21 DIAGNOSIS — E875 Hyperkalemia: Secondary | ICD-10-CM | POA: Diagnosis not present

## 2024-03-21 NOTE — Progress Notes (Signed)
  Subjective: Sleeping, not interactive, wife present and provided interview.   Objective: Vital signs in last 24 hours: Temp:  [97.9 F (36.6 C)-99 F (37.2 C)] 99 F (37.2 C) (10/12 1925) Pulse Rate:  [61] 61 (10/12 1349) Resp:  [19] 19 (10/12 1349) BP: (132-150)/(63-122) 150/122 (10/12 1925) SpO2:  [95 %] 95 % (10/12 1349)  Intake/Output from previous day: 10/12 0701 - 10/13 0700 In: 120 [P.O.:120] Out: -  Intake/Output this shift: No intake/output data recorded.  Physical Exam:  General: Sleeping  Neurologic: sleeping does not awake for interview, making jerking movements while sleeping.  GU: catheter in place draining light pink urine.   Lab Results: No results for input(s): HGB, HCT in the last 72 hours. BMET No results for input(s): NA, K, CL, CO2, GLUCOSE, BUN, CREATININE, CALCIUM  in the last 72 hours.   Studies/Results: No results found.  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 Recommend continuing catheter as outpatient having hospice do the catheter changes.  - urology to sign off.    LOS: 4 days   Jackey Pea MD 03/21/2024, 7:32 AM [facility]

## 2024-03-21 NOTE — Plan of Care (Incomplete)
  Problem: Education: Goal: Knowledge of General Education information will improve Description: Including pain rating scale, medication(s)/side effects and non-pharmacologic comfort measures Outcome: Progressing   Problem: Clinical Measurements: Goal: Will remain free from infection Outcome: Progressing   Problem: Pain Managment: Goal: General experience of comfort will improve and/or be controlled Outcome: Progressing   Problem: Skin Integrity: Goal: Risk for impaired skin integrity will decrease Outcome: Progressing   Problem: Health Behavior/Discharge Planning: Goal: Ability to manage health-related needs will improve Outcome: Not Progressing   Problem: Clinical Measurements: Goal: Diagnostic test results will improve Outcome: Not Progressing   Problem: Activity: Goal: Risk for activity intolerance will decrease Outcome: Not Progressing   Problem: Nutrition: Goal: Adequate nutrition will be maintained Outcome: Not Progressing   Problem: Clinical Measurements: Goal: Respiratory complications will improve Outcome: Adequate for Discharge Goal: Cardiovascular complication will be avoided Outcome: Adequate for Discharge   Problem: Coping: Goal: Level of anxiety will decrease Outcome: Adequate for Discharge   Problem: Elimination: Goal: Will not experience complications related to bowel motility Outcome: Adequate for Discharge

## 2024-03-21 NOTE — Progress Notes (Signed)
 Daily Progress Note   Patient Name: Richard Stanley       Date: 03/21/2024 DOB: [birth year]  Age: 88 y.o. MRN#: [id number] Attending Physician: Davia Nydia POUR, MD Primary Care Physician: McClanahan, Kyra, NP Admit Date: 03/17/2024 Length of Stay: 4 days  Reason for Consultation/Follow-up: Establishing goals of care  Subjective:   CC: Patient notes pain improved.  Following up regarding complex medical decision making.  Subjective:  Reviewed EMR including recent documentation from hospitalist.     Presented to bedside to see patient.  Patient's wife sitting at bedside.  Patient lethargic    Patient still having  myoclonic jerking at times.  Wife states that plan is no longer residential hospice, they want home with hospice in daughter's home in [place], [place].   Continuing comfort focused care at this time.  Spent time providing emotional support via active listening.    Discussed end of life signs and symptoms.   Wife has questions regarding cremation upon his death, home hospice set up and also the process for making an anatomical donation of his body after death - answered her questions to the best of my ability and will also involve TOC for further assistance.  Noted palliative medicine team continue to follow along with patient's medical journey.  Objective:   Vital Signs:  BP (!) 150/122 (BP Location: Left Arm)   Pulse 61   Temp 99 F (37.2 C) (Oral)   Resp 19   Ht 6' (1.829 m)   Wt 62 kg   SpO2 95%   BMI 18.54 kg/m   Physical Exam: General: Ill-appearing, lethargic though will awake, hard of hearing, myoclonic jerking   Cardiovascular: RRR Respiratory: no increased work of breathing noted, not in respiratory distress Abdomen: not distended Skin: Multiple ecchymoses on upper extremities bilaterally Neuro:   lethargic at times  Assessment & Plan:   Assessment: Patient is a 88 year old male with past medical history of atrial fibrillation, hypertension, CKD stage III,  COPD, dementia, depression, hyperlipidemia, and aggressive bladder cancer who was admitted on 03/17/2024 for management of AMS and generalized weakness.  Patient has history of TURBT by urology in March 2025 which showed concern and was recommended for rebiopsy as well as radical cystectomy though patient and wife declined this intervention; noted they wanted to focus on quality of life with his advanced age.  Since admission, patient receiving management for acute renal failure superimposed on CKD, metabolic acidosis, severe hyperkalemia, acute metabolic encephalopathy and myoclonic jerking likely secondary to uremia from acute renal failure, and possible urinary tract infection.  Urology, neurology, and nephrology consulted for recommendations.  Palliative medicine team consulted to assist with complex medical decision making.   Recommendations/Plan: # Complex medical decision making/goals of care:   -Patient unable to participate in medical decision making secondary to mental status.                - Discussed care with patient's wife at bedside today as per HPI.  Had transitioned to comfort focused care on 03/18/2024 as per wife's wishes for the patient.  Continuing comfort focused care at this time.        Palliative medicine team continuing to follow along with patient's medical journey.                - Have discontinued interventions that are no longer focused on comfort such as IV fluids, imaging, or lab work.  Concentrating on symptom management of pain, dyspnea, and agitation in the setting of  end-of-life care.                  Code Status: Do not attempt resuscitation (DNR) - Comfort care   # Symptom management Patient is receiving these palliative interventions for symptom management with an intent to improve quality of life.                   -Pain/Dyspnea, acute in the setting of end-of-life care                               -Continue IV Dilaudid 0.5-2 mg IV every 30 minutes as needed.   Continue to adjust based on patient's symptom burden.  If patient needing frequent dosing, may need to consider continuous infusion.                  -Anxiety/agitation, in the setting of end-of-life care                               -Continue as needed Ativan.  Continue to adjust based on patient's symptom burden.                  -Secretions, in the setting of end-of-life care                               -Continue as needed glycopyrrolate.    # Psycho-social/Spiritual Support:  - Support System: Wife, adopted daughter, son who lives in [place]  - Desire for further Chaplain support:yes  # Discharge Planning: To Be Determined  - Continuing comfort focused care at this time.  Either anticipated in-hospital death or transfer to home with hospice at daughter's home in [place], [place].  Wife has questions regarding making an anatomical donation of his body after his death, she also asks about local hospice in [place], [place]. Will request TOC input for assistance.   Discussed with: Patient, patient's wife and TOC   Thank you for allowing the palliative care team to participate in the care [name].  High MDM Lonia Serve MD.  Palliative Care Provider PMT # [phone]  If patient remains symptomatic despite maximum doses, please call PMT at [phone] between 0700 and 1900. Outside of these hours, please call attending, as PMT does not have night coverage.

## 2024-03-21 NOTE — TOC Progression Note (Signed)
 Transition of Care [facility]) - Progression Note   Patient Details  Name: Richard Stanley MRN: [id number] Date of Birth: Jun 23, 1935  Transition of Care [facility]) CM/SW Contact  Duwaine GORMAN Aran, LCSW Phone Number: 03/21/2024, 1:32 PM  Clinical Narrative: CSW notified by palliative that wife is requesting [facility] for home hospice services at daughter's home in [place]. CSW met with wife; patient appeared to be resting. Wife confirmed the patient will discharge to her daughter's home in [place], but she was unable to provide the address as daughter will be moving in on 03/23/24. CSW made home hospice referral to [place] with [facility]. [facility] to follow up with spouse.  Expected Discharge Plan: Home w Hospice Care Barriers to Discharge: Other (must enter comment) (DME will need to be delivered by hospice.)  Expected Discharge Plan and Services In-house Referral: Clinical Social Work, Hospice / Palliative Care Post Acute Care Choice: Hospice Living arrangements for the past 2 months: Apartment           DME Arranged: N/A DME Agency: NA  Social Drivers of Health (SDOH) Interventions SDOH Screenings   Food Insecurity: No Food Insecurity (03/17/2024)  Housing: Low Risk  (03/17/2024)  Transportation Needs: No Transportation Needs (03/17/2024)  Utilities: Not At Risk (03/17/2024)  Financial Resource Strain: Low Risk  (02/17/2024)   Received from [facility]  Physical Activity: Unknown (05/13/2023)   Received from [facility]  Social Connections: Unknown (03/17/2024)  Stress: No Stress Concern Present (05/13/2023)   Received from [facility]  Tobacco Use: Medium Risk (03/17/2024)   Readmission Risk Interventions     No data to display

## 2024-03-21 NOTE — Progress Notes (Signed)
 I followed up with Richard Stanley, Richard Stanley's wife.  She appeared more at peace today and is providing him with comfort and peace.  She is grateful that he looks more comfortable than when I saw them on Friday. She has been concerned about her sister who is also in the hospital and had been trying to reach her.  Her sister called a few minutes into our visit and I left to give her privacy so she could speak with her sister.  We will continue to follow, but please also page as needs arise.

## 2024-03-21 NOTE — Progress Notes (Signed)
 [facility]                                                                              Richard Stanley, is a 88 y.o. male, DOB - 1935-12-02, FMW:[id number] Admit date - 03/17/2024    Outpatient Primary MD for the patient is McClanahan, Kyra, NP  LOS - 4  days  Chief Complaint  Patient presents with   Weakness       Brief summary   Patient is a 88 y.o. male with atrial fibrillation, hypertension, CKD stage III, COPD, dementia, depression, hyperlipidemia presented to ED via EMS from home. Family had reported to the EMS that he was not acting his self and was confused.  Due to altered mental status, patient was not able to provide good history.  He did report that he had no nausea vomiting or any diarrhea.   At baseline ambulates with a walker.  Per family, patient was very weak and not able to stand up on his own this morning.  EMS was concerned about UTI. Patient was not able to tell if he had any difficulty urinating.  On admission, Labs showed sodium 131, potassium 6.1, chloride 96, CO2 13, BUN 137, creatinine 15.9 Baseline creatinine 1.6 on 02/08/2024  Assessment & Plan    Severe AKI (acute kidney injury) superimposed on CKD stage IIIb with hyperkalemia, AG metabolic acidosis - Baseline creatinine 1.6 on 02/08/2024, on admission creatinine 15.9-> 18.0 - CT abdomen showed moderate bilateral hydroureteronephrosis, ureters are dilated to the bladder insertion, no renal or ureteral calculi.  Enlarged prostate. -Foley catheter placed, initially creatinine improved after Foley placement however trending up again to 16.3  - Urology consulted, patient has aggressive bladder CA, likely the cause of bladder outlet obstruction.  Patient and his family had declined workup, treatment or surgery in the past. -Discussed with urology, recommended to continue Foley.  If renal function did not improve, likely this would be terminal and palliative medicine consult for GOC. - Palliative  medicine following, GOC 10/10, comfort care  - No acute issues, awaiting disposition    Severe hyperkalemia - Received albuterol  nebs, IV calcium  gluconate, sodium bicarb, Lokelma, received insulin/D50  - comfort care    Acute metabolic encephalopathy, myoclonic jerking movements likely due to uremia, #1  - Likely due to #1 - CT head showed no acute intracranial abnormality - Urine culture showed no growth - Comfort care    Atrial fibrillation, chronic, hypertension - Had history of cardioversion on 11/12/2021, follows Dr. Ladona  - HR in 50-60s, hold amiodarone  - Not on any anticoagulation - Comfort care       COPD (chronic obstructive pulmonary disease) (HCC) -Currently no wheezing    Anemia of chronic disease, normocytic - Baseline hemoglobin 13.0 on 02/08/2024,  - no active bleeding   History of aggressive urothelial carcinoma of bladder (HCC) - Now comfort care   Pressure Injury Documentation: POA Wound 03/17/24 2115 Pressure Injury Ischial tuberosity Left Stage 2 -  Partial thickness loss of dermis presenting as a shallow open injury with a red, pink wound bed without slough. (Active)  -Continue wound care  Underweight Estimated body  mass index is 18.54 kg/m as calculated from the following:   Height as of this encounter: 6' (1.829 m).   Weight as of this encounter: 62 kg.  Code Status: DNR DVT Prophylaxis:     Level of Care: Level of care: Progressive Family Communication: Updated patient's wife at the bedside today Disposition Plan:      Remains inpatient appropriate: Awaiting disposition  Procedures:    Consultants:   Urology Nephrology Palliative medicine  Antimicrobials:   Anti-infectives (From admission, onward)    Start     Dose/Rate Route Frequency Ordered Stop   03/18/24 0900  cefTRIAXone  (ROCEPHIN ) 1 g in sodium chloride  0.9 % 100 mL IVPB  Status:  Discontinued        1 g 200 mL/hr over 30 Minutes Intravenous Every 24 hours 03/18/24 0800  03/18/24 1631          Medications      Subjective:   Richard Stanley was seen and examined today.  Alert and awake, appears comfortable.  Jerking movements much less now.  Pain controlled.  Objective:   Vitals:   03/20/24 0625 03/20/24 1349 03/20/24 1925 03/21/24 1325  BP:  132/63 (!) 150/122 127/61  Pulse:  61  66  Resp:  19  16  Temp:  97.9 F (36.6 C) 99 F (37.2 C) 98.2 F (36.8 C)  TempSrc:  Oral Oral Oral  SpO2: 94% 95%  91%  Weight:      Height:        Intake/Output Summary (Last 24 hours) at 03/21/2024 1403 Last data filed at 03/20/2024 2200 Gross per 24 hour  Intake 120 ml  Output --  Net 120 ml     Wt Readings from Last 3 Encounters:  03/17/24 62 kg  02/08/24 61.7 kg  10/29/23 63.6 kg   Physical Exam General: Alert and, comfortable. Cardiovascular: S1 S2 clear, RRR.  Respiratory: CTAB Gastrointestinal: Soft, nontender, nondistended, NBS Ext: no pedal edema bilaterally Neuro: Moving all 4 extremities    Data Reviewed:  I have personally reviewed following labs    CBC Lab Results  Component Value Date   WBC 8.8 03/18/2024   RBC 3.58 (L) 03/18/2024   HGB 10.3 (L) 03/18/2024   HCT 31.2 (L) 03/18/2024   MCV 87.2 03/18/2024   MCH 28.8 03/18/2024   PLT 184 03/18/2024   MCHC 33.0 03/18/2024   RDW 13.5 03/18/2024   LYMPHSABS 0.9 02/08/2024   MONOABS 0.8 02/08/2024   EOSABS 0.1 02/08/2024   BASOSABS 0.0 02/08/2024     Last metabolic panel Lab Results  Component Value Date   NA 133 (L) 03/18/2024   K 5.7 (H) 03/18/2024   CL 94 (L) 03/18/2024   CO2 21 (L) 03/18/2024   BUN 137 (H) 03/18/2024   CREATININE 16.30 (H) 03/18/2024   GLUCOSE 84 03/18/2024   GFRNONAA 3 (L) 03/18/2024   GFRAA 44 (L) 02/22/2018   CALCIUM  8.3 (L) 03/18/2024   PHOS 2.3 (L) 03/05/2022   PROT 6.0 (L) 02/08/2024   ALBUMIN 3.8 02/08/2024   BILITOT 0.3 02/08/2024   ALKPHOS 116 02/08/2024   AST 22 02/08/2024   ALT 15 02/08/2024   ANIONGAP 19 (H)  03/18/2024    CBG (last 3)  No results for input(s): GLUCAP in the last 72 hours.     Coagulation Profile: No results for input(s): INR, PROTIME in the last 168 hours.   Radiology Studies: I have personally reviewed the imaging studies  No results found.  Nydia Distance M.D. [facility] 03/21/2024, 2:03 PM  Available via Epic secure chat 7am-7pm After 7 pm, please refer to night coverage provider listed on amion.

## 2024-03-22 DIAGNOSIS — R4182 Altered mental status, unspecified: Secondary | ICD-10-CM | POA: Diagnosis not present

## 2024-03-22 DIAGNOSIS — N179 Acute kidney failure, unspecified: Secondary | ICD-10-CM | POA: Diagnosis not present

## 2024-03-22 DIAGNOSIS — E875 Hyperkalemia: Secondary | ICD-10-CM | POA: Diagnosis not present

## 2024-03-22 NOTE — Progress Notes (Signed)
 Daily Progress Note   Patient Name: Richard Stanley       Date: 03/22/2024 DOB: [birth year]  Age: 88 y.o. MRN#: [id number] Attending Physician: Davia Nydia POUR, MD Primary Care Physician: McClanahan, Kyra, NP Admit Date: 03/17/2024 Length of Stay: 5 days  Reason for Consultation/Follow-up: Establishing goals of care  Subjective:   CC: Patient resting in bed   Following up regarding complex medical decision making.  Subjective:  Reviewed EMR including recent documentation from hospitalist.     Presented to bedside to see patient.  Patient's wife sitting at bedside.  Patient lethargic    Patient still having  myoclonic jerking at times.    Discharge plan remains for home with hospice in daughter's home in [place], [place].   Continuing comfort focused care at this time.  Spent time providing emotional support via active listening.    Discussed end of life signs and symptoms.     Noted palliative medicine team continue to follow along with patient's medical journey.  Objective:   Vital Signs:  BP 127/61 (BP Location: Left Arm)   Pulse 66   Temp 98.2 F (36.8 C) (Oral)   Resp 16   Ht 6' (1.829 m)   Wt 62 kg   SpO2 91%   BMI 18.54 kg/m   Physical Exam: General: Ill-appearing, lethargic though will awake, hard of hearing, myoclonic jerking   Cardiovascular: RRR Respiratory: no increased work of breathing noted, not in respiratory distress Abdomen: not distended Skin: Multiple ecchymoses on upper extremities bilaterally Neuro:   lethargic at times  Assessment & Plan:   Assessment: Patient is a 88 year old male with past medical history of atrial fibrillation, hypertension, CKD stage III, COPD, dementia, depression, hyperlipidemia, and aggressive bladder cancer who was admitted on 03/17/2024 for management of AMS and generalized weakness.  Patient has history of TURBT by urology in March 2025 which showed concern and was recommended for rebiopsy as well as radical cystectomy though  patient and wife declined this intervention; noted they wanted to focus on quality of life with his advanced age.  Since admission, patient receiving management for acute renal failure superimposed on CKD, metabolic acidosis, severe hyperkalemia, acute metabolic encephalopathy and myoclonic jerking likely secondary to uremia from acute renal failure, and possible urinary tract infection.  Urology, neurology, and nephrology consulted for recommendations.  Palliative medicine team consulted to assist with complex medical decision making.   Recommendations/Plan: # Complex medical decision making/goals of care:   -Patient unable to participate in medical decision making secondary to mental status.                - Discussed care with patient's wife at bedside today as per HPI.  Had transitioned to comfort focused care on 03/18/2024 as per wife's wishes for the patient.  Continuing comfort focused care at this time.        Palliative medicine team continuing to follow along with patient's medical journey.                - Have discontinued interventions that are no longer focused on comfort such as IV fluids, imaging, or lab work.  Concentrating on symptom management of pain, dyspnea, and agitation in the setting of end-of-life care.                  Code Status: Do not attempt resuscitation (DNR) - Comfort care   # Symptom management Patient is receiving these palliative interventions for symptom management with an intent to improve  quality of life.                   -Pain/Dyspnea, acute in the setting of end-of-life care                               -Continue IV Dilaudid 0.5-2 mg IV every 30 minutes as needed.  Continue to adjust based on patient's symptom burden.  If patient needing frequent dosing, may need to consider continuous infusion.                  -Anxiety/agitation, in the setting of end-of-life care                               -Continue as needed Ativan.  Continue to adjust based on  patient's symptom burden.                  -Secretions, in the setting of end-of-life care                               -Continue as needed glycopyrrolate.    # Psycho-social/Spiritual Support:  - Support System: Wife, adopted daughter, son who lives in [place]  - Desire for further Chaplain support:yes Appreciate chaplain follow up.   # Discharge Planning: To Be Determined  - Continuing comfort focused care at this time.  Either anticipated in-hospital death or transfer to home with hospice at daughter's home in [place], [place].  Wife has questions regarding making an anatomical donation of his body after his death - online information handout given.  [facility] been consulted to help with the home with hospice arrangements.    Discussed with:  patient's wife    Thank you for allowing the palliative care team to participate in the care Richard Stanley.  High MDM Lonia Serve MD.  Palliative Care Provider PMT # [phone]  If patient remains symptomatic despite maximum doses, please call PMT at [phone] between 0700 and 1900. Outside of these hours, please call attending, as PMT does not have night coverage.

## 2024-03-22 NOTE — Plan of Care (Incomplete)
  Problem: Education: Goal: Knowledge of General Education information will improve Description: Including pain rating scale, medication(s)/side effects and non-pharmacologic comfort measures Outcome: Progressing   Problem: Clinical Measurements: Goal: Will remain free from infection Outcome: Progressing Goal: Respiratory complications will improve Outcome: Progressing Goal: Cardiovascular complication will be avoided Outcome: Progressing   Problem: Activity: Goal: Risk for activity intolerance will decrease Outcome: Progressing   Problem: Elimination: Goal: Will not experience complications related to bowel motility Outcome: Progressing Goal: Will not experience complications related to urinary retention Outcome: Progressing   Problem: Pain Managment: Goal: General experience of comfort will improve and/or be controlled Outcome: Progressing   Problem: Safety: Goal: Ability to remain free from injury will improve Outcome: Progressing   Problem: Skin Integrity: Goal: Risk for impaired skin integrity will decrease Outcome: Progressing   Problem: Health Behavior/Discharge Planning: Goal: Ability to manage health-related needs will improve Outcome: Not Progressing   Problem: Clinical Measurements: Goal: Ability to maintain clinical measurements within normal limits will improve Outcome: Not Progressing Goal: Diagnostic test results will improve Outcome: Not Progressing   Problem: Nutrition: Goal: Adequate nutrition will be maintained Outcome: Not Progressing

## 2024-03-22 NOTE — Progress Notes (Signed)
 [facility]                                                                              Richard Stanley, is a 88 y.o. male, DOB - 10/10/1935, FMW:[id number] Admit date - 03/17/2024    Outpatient Primary MD for the patient is McClanahan, Kyra, NP  LOS - 5  days  Chief Complaint  Patient presents with   Weakness       Brief summary   Patient is a 87 y.o. male with atrial fibrillation, hypertension, CKD stage III, COPD, dementia, depression, hyperlipidemia presented to ED via EMS from home. Family had reported to the EMS that he was not acting his self and was confused.  Due to altered mental status, patient was not able to provide good history.  He did report that he had no nausea vomiting or any diarrhea.   At baseline ambulates with a walker.  Per family, patient was very weak and not able to stand up on his own this morning.  EMS was concerned about UTI. Patient was not able to tell if he had any difficulty urinating.  On admission, Labs showed sodium 131, potassium 6.1, chloride 96, CO2 13, BUN 137, creatinine 15.9 Baseline creatinine 1.6 on 02/08/2024  Assessment & Plan    Severe AKI (acute kidney injury) superimposed on CKD stage IIIb with hyperkalemia, AG metabolic acidosis - Baseline creatinine 1.6 on 02/08/2024, on admission creatinine 15.9-> 18.0 - CT abdomen showed moderate bilateral hydroureteronephrosis, ureters are dilated to the bladder insertion, no renal or ureteral calculi.  Enlarged prostate. -Foley catheter placed, initially creatinine improved after Foley placement however trending up again to 16.3  - Urology consulted, patient has aggressive bladder CA, likely the cause of bladder outlet obstruction.  Patient and his family had declined workup, treatment or surgery in the past. -Discussed with urology, recommended to continue Foley.  If renal function did not improve, likely this would be terminal and palliative medicine consult for GOC. - Palliative  medicine following, GOC 10/10, comfort care  - Unclear disposition, initial plan was residential hospice, now patient will be moving to daughter's home with hospice however daughter is changing residence on 10/15, TOC aware and assisting.  Severe hyperkalemia - Received albuterol  nebs, IV calcium  gluconate, sodium bicarb, Lokelma, received insulin/D50  - comfort care    Acute metabolic encephalopathy, myoclonic jerking movements likely due to uremia, #1  - Likely due to #1 - CT head showed no acute intracranial abnormality - Urine culture showed no growth - Comfort care    Atrial fibrillation, chronic, hypertension - Had history of cardioversion on 11/12/2021, follows Dr. Ladona  - HR in 50-60s, hold amiodarone  - Not on any anticoagulation - Comfort care       COPD (chronic obstructive pulmonary disease) (HCC) -Currently no wheezing    Anemia of chronic disease, normocytic - Baseline hemoglobin 13.0 on 02/08/2024,  - no active bleeding   History of aggressive urothelial carcinoma of bladder (HCC) - Now comfort care   Pressure Injury Documentation: POA Wound 03/17/24 2115 Pressure Injury Ischial tuberosity Left Stage 2 -  Partial thickness loss of dermis presenting as  a shallow open injury with a red, pink wound bed without slough. (Active)  -Continue wound care  Underweight Estimated body mass index is 18.54 kg/m as calculated from the following:   Height as of this encounter: 6' (1.829 m).   Weight as of this encounter: 62 kg.  Code Status: DNR DVT Prophylaxis:     Level of Care: Level of care: Progressive Family Communication: Updated patient's wife at the bedside today Disposition Plan:      Remains inpatient appropriate: Awaiting disposition  Procedures:    Consultants:   Urology Nephrology Palliative medicine  Antimicrobials:   Anti-infectives (From admission, onward)    Start     Dose/Rate Route Frequency Ordered Stop   03/18/24 0900  cefTRIAXone   (ROCEPHIN ) 1 g in sodium chloride  0.9 % 100 mL IVPB  Status:  Discontinued        1 g 200 mL/hr over 30 Minutes Intravenous Every 24 hours 03/18/24 0800 03/18/24 1631          Medications      Subjective:   Richard Stanley was seen and examined today.  Awake, wife at the bedside.  States having some headache and pain, appears comfortable.  Jerking movements are much less now.   Objective:   Vitals:   03/20/24 0625 03/20/24 1349 03/20/24 1925 03/21/24 1325  BP:  132/63 (!) 150/122 127/61  Pulse:  61  66  Resp:  19  16  Temp:  97.9 F (36.6 C) 99 F (37.2 C) 98.2 F (36.8 C)  TempSrc:  Oral Oral Oral  SpO2: 94% 95%  91%  Weight:      Height:        Intake/Output Summary (Last 24 hours) at 03/22/2024 1216 Last data filed at 03/22/2024 1012 Gross per 24 hour  Intake 240 ml  Output 100 ml  Net 140 ml     Wt Readings from Last 3 Encounters:  03/17/24 62 kg  02/08/24 61.7 kg  10/29/23 63.6 kg   Physical Exam General: Alert and awake, appears comfortable Cardiovascular: S1 S2 clear, RRR.  Respiratory: CTAB, Gastrointestinal: Soft, nontender, nondistended, NBS Ext: no pedal edema bilaterally     Data Reviewed:  I have personally reviewed following labs    CBC Lab Results  Component Value Date   WBC 8.8 03/18/2024   RBC 3.58 (L) 03/18/2024   HGB 10.3 (L) 03/18/2024   HCT 31.2 (L) 03/18/2024   MCV 87.2 03/18/2024   MCH 28.8 03/18/2024   PLT 184 03/18/2024   MCHC 33.0 03/18/2024   RDW 13.5 03/18/2024   LYMPHSABS 0.9 02/08/2024   MONOABS 0.8 02/08/2024   EOSABS 0.1 02/08/2024   BASOSABS 0.0 02/08/2024     Last metabolic panel Lab Results  Component Value Date   NA 133 (L) 03/18/2024   K 5.7 (H) 03/18/2024   CL 94 (L) 03/18/2024   CO2 21 (L) 03/18/2024   BUN 137 (H) 03/18/2024   CREATININE 16.30 (H) 03/18/2024   GLUCOSE 84 03/18/2024   GFRNONAA 3 (L) 03/18/2024   GFRAA 44 (L) 02/22/2018   CALCIUM  8.3 (L) 03/18/2024   PHOS 2.3 (L)  03/05/2022   PROT 6.0 (L) 02/08/2024   ALBUMIN 3.8 02/08/2024   BILITOT 0.3 02/08/2024   ALKPHOS 116 02/08/2024   AST 22 02/08/2024   ALT 15 02/08/2024   ANIONGAP 19 (H) 03/18/2024    CBG (last 3)  No results for input(s): GLUCAP in the last 72 hours.     Coagulation Profile: No  results for input(s): INR, PROTIME in the last 168 hours.   Radiology Studies: I have personally reviewed the imaging studies  No results found.      Nydia Distance M.D. [facility] 03/22/2024, 12:16 PM  Available via Epic secure chat 7am-7pm After 7 pm, please refer to night coverage provider listed on amion.

## 2024-03-23 DIAGNOSIS — N179 Acute kidney failure, unspecified: Secondary | ICD-10-CM | POA: Diagnosis not present

## 2024-03-23 NOTE — Progress Notes (Signed)
 I attempted to follow up with Richard Stanley, Richard Stanley's wife, to provide support.  She was on the phone at time of visit.  Spiritual Care continues to follow Richard Stanley and his family.  Please page as needs arise.

## 2024-03-23 NOTE — Progress Notes (Signed)
 Daily Progress Note   Patient Name: Richard Stanley       Date: 03/23/2024 DOB: [birth year]  Age: 88 y.o. MRN#: [id number] Attending Physician: Richard Elsie BROCKS, MD Primary Care Physician: Richard Drafts, NP Admit Date: 03/17/2024 Length of Stay: 6 days  Reason for Consultation/Follow-up: Establishing goals of care  Subjective:   CC: Patient resting in bed Not in acute distress currently.    Following up regarding complex medical decision making.  Subjective:  Reviewed EMR including recent documentation from hospitalist.     Presented to bedside to see patient.  Patient's wife sitting at bedside.  Patient lethargic    Patient with less myoclonic jerking at times.    Discharge plan remains for home with hospice in daughter's home in [place], [place]. Appreciate TOC follow up.   Continuing comfort focused care at this time.  Spent time providing emotional support via active listening.    Discussed end of life signs and symptoms.     Noted palliative medicine team continue to follow along with patient's medical journey.  Objective:   Vital Signs:  BP 132/69 (BP Location: Left Arm)   Pulse 63   Temp 98.3 F (36.8 C) (Oral)   Resp 18   Ht 6' (1.829 m)   Wt 62 kg   SpO2 93%   BMI 18.54 kg/m   Physical Exam: General: Ill-appearing, lethargic though will awake, hard of hearing, fewer myoclonic jerking   Cardiovascular: RRR Respiratory: no increased work of breathing noted, not in respiratory distress Abdomen: not distended Skin: Multiple ecchymoses on upper extremities bilaterally Neuro:   lethargic at times  Assessment & Plan:   Assessment: Patient is a 88 year old male with past medical history of atrial fibrillation, hypertension, CKD stage III, COPD, dementia, depression, hyperlipidemia, and aggressive bladder cancer who was admitted on 03/17/2024 for management of AMS and generalized weakness.  Patient has history of TURBT by urology in March 2025 which showed concern  and was recommended for rebiopsy as well as radical cystectomy though patient and wife declined this intervention; noted they wanted to focus on quality of life with his advanced age.  Since admission, patient receiving management for acute renal failure superimposed on CKD, metabolic acidosis, severe hyperkalemia, acute metabolic encephalopathy and myoclonic jerking likely secondary to uremia from acute renal failure, and possible urinary tract infection.  Urology, neurology, and nephrology consulted for recommendations.  Palliative medicine team consulted to assist with complex medical decision making.   Recommendations/Plan: # Complex medical decision making/goals of care:   -Patient unable to participate in medical decision making secondary to mental status.                - Discussed care with patient's wife at bedside today    Had transitioned to comfort focused care on 03/18/2024 as per wife's wishes for the patient.  Continuing comfort focused care at this time.        Palliative medicine team continuing to follow along with patient's medical journey.                - Have discontinued interventions that are no longer focused on comfort such as IV fluids, imaging, or lab work.  Concentrating on symptom management of pain, dyspnea, and agitation in the setting of end-of-life care.                  Code Status: Do not attempt resuscitation (DNR) - Comfort care   # Symptom management Patient is receiving these palliative  interventions for symptom management with an intent to improve quality of life.                   -Pain/Dyspnea, acute in the setting of end-of-life care                               -Continue IV Dilaudid 0.5-2 mg IV every 30 minutes as needed.  Continue to adjust based on patient's symptom burden.  If patient needing frequent dosing, may need to consider continuous infusion.                  -Anxiety/agitation, in the setting of end-of-life care                                -Continue as needed Ativan.  Continue to adjust based on patient's symptom burden.                  -Secretions, in the setting of end-of-life care                               -Continue as needed glycopyrrolate.    # Psycho-social/Spiritual Support:  - Support System: Wife, adopted daughter, son who lives in [place]  - Desire for further Chaplain support:yes Appreciate chaplain follow up.   # Discharge Planning: To Be Determined  - Continuing comfort focused care at this time.  Either anticipated in-hospital death or transfer to home with hospice at daughter's home in [place], [place].  Wife has questions regarding making an anatomical donation of his body after his death - online information handout given.  [facility] to help with the home with hospice arrangements.    Discussed with:  patient's wife    Thank you for allowing the palliative care team to participate in the care Richard Stanley.  High MDM Richard Serve MD.  Palliative Care Provider PMT # [phone]  If patient remains symptomatic despite maximum doses, please call PMT at [phone] between 0700 and 1900. Outside of these hours, please call attending, as PMT does not have night coverage.

## 2024-03-23 NOTE — TOC Progression Note (Signed)
 Transition of Care [facility]) - Progression Note   Patient Details  Name: Richard Stanley MRN: [id number] Date of Birth: [birth year]  [facility]) CM/SW Contact  Duwaine GORMAN Aran, LCSW Phone Number: 03/23/2024, 10:27 AM  Clinical Narrative: CSW met with wife this morning to discuss discharge plan. Per wife, her daughter will now not get the keys to the home until tomorrow, so [employer] has not been able to schedule DME delivery yet. Wife reported she is hoping to know more tomorrow, but has been unable to reach her daughter.  Expected Discharge Plan: Home w Hospice Care Barriers to Discharge: Other (must enter comment) (DME will need to be delivered by hospice.)  Expected Discharge Plan and Services In-house Referral: Clinical Social Work, Hospice / Palliative Care Post Acute Care Choice: Hospice Living arrangements for the past 2 months: Apartment           DME Arranged: N/A DME Agency: NA  Social Drivers of Health (SDOH) Interventions SDOH Screenings   Food Insecurity: No Food Insecurity (03/17/2024)  Housing: Low Risk  (03/17/2024)  Transportation Needs: No Transportation Needs (03/17/2024)  Utilities: Not At Risk (03/17/2024)  Financial Resource Strain: Low Risk  (02/17/2024)   Received from [facility]  Physical Activity: Unknown (05/13/2023)   Received from [facility]  Social Connections: Unknown (03/17/2024)  Stress: No Stress Concern Present (05/13/2023)   Received from [facility]  Tobacco Use: Medium Risk (03/17/2024)   Readmission Risk Interventions     No data to display

## 2024-03-23 NOTE — Plan of Care (Signed)
  Problem: Pain Managment: Goal: General experience of comfort will improve and/or be controlled Outcome: Progressing   Problem: Role Relationship: Goal: Family's ability to cope with current situation will improve Outcome: Progressing Goal: Ability to verbalize concerns, feelings, and thoughts to partner or family member will improve Outcome: Progressing   Problem: Pain Management: Goal: Satisfaction with pain management regimen will improve Outcome: Progressing   Problem: Education: Goal: Knowledge of General Education information will improve Description: Including pain rating scale, medication(s)/side effects and non-pharmacologic comfort measures Outcome: Not Applicable   Problem: Health Behavior/Discharge Planning: Goal: Ability to manage health-related needs will improve Outcome: Not Applicable   Problem: Clinical Measurements: Goal: Ability to maintain clinical measurements within normal limits will improve Outcome: Not Applicable Goal: Will remain free from infection Outcome: Not Applicable Goal: Diagnostic test results will improve Outcome: Not Applicable Goal: Respiratory complications will improve Outcome: Not Applicable Goal: Cardiovascular complication will be avoided Outcome: Not Applicable   Problem: Activity: Goal: Risk for activity intolerance will decrease Outcome: Not Applicable   Problem: Nutrition: Goal: Adequate nutrition will be maintained Outcome: Not Applicable   Problem: Coping: Goal: Level of anxiety will decrease Outcome: Not Applicable   Problem: Elimination: Goal: Will not experience complications related to bowel motility Outcome: Not Applicable Goal: Will not experience complications related to urinary retention Outcome: Not Applicable   Problem: Safety: Goal: Ability to remain free from injury will improve Outcome: Not Applicable   Problem: Skin Integrity: Goal: Risk for impaired skin integrity will decrease Outcome: Not  Applicable   Problem: Education: Goal: Knowledge of the prescribed therapeutic regimen will improve Outcome: Not Applicable   Problem: Coping: Goal: Ability to identify and develop effective coping behavior will improve Outcome: Not Applicable   Problem: Clinical Measurements: Goal: Quality of life will improve Outcome: Not Applicable   Problem: Respiratory: Goal: Verbalizations of increased ease of respirations will increase Outcome: Not Applicable

## 2024-03-23 NOTE — Progress Notes (Signed)
 PROGRESS NOTE    Richard Stanley  FMW:[id number] DOB: 10-29-1935 DOA: 03/17/2024 PCP: Wallie Drafts, NP   Brief Narrative:  Patient is a 88 y.o. male with atrial fibrillation, hypertension, CKD stage III, COPD, dementia, depression, hyperlipidemia presented to ED via EMS from home. Family had reported to the EMS that he was not acting his self and was confused. Due to altered mental status, patient was not able to provide good history.  He did report that he had no nausea vomiting or any diarrhea. At baseline ambulates with a walker.  Per family, patient was very weak and not able to stand up on his own.  EMS was concerned about UTI. Patient was not able to tell if he had any difficulty urinating.  Patient remains medically stable at this time, as patient approaches end-of-life family and multifaceted health care team continue to assess daily for possible disposition.  Current plan is for discharge to family's residence(currently moving on 10/15 -hopeful for discharge in the next few days),     Assessment & Plan:   Principal Problem:   AKI (acute kidney injury) Active Problems:   CKD (chronic kidney disease), stage III (HCC)   HLD (hyperlipidemia)   COPD (chronic obstructive pulmonary disease) (HCC)   Essential hypertension   Atrial fibrillation, chronic (HCC)   Urothelial carcinoma of bladder (HCC)   Acute metabolic encephalopathy   Pain   High risk medication use   Hyperkalemia   Altered mental status   DNR (do not resuscitate)   Concern about end of life   ACP (advance care planning)   Counseling and coordination of care   Acute renal failure   Medication management   Palliative care encounter   Goals of care, counseling/discussion   Need for emotional support   Goals of care, currently under comfort measures - Palliative medicine following, GOC 10/10, comfort care  - Unclear disposition, initial plan was residential hospice, now patient will be moving to daughter's  home with hospice however daughter is changing residence on 10/15, TOC aware and assisting.  Severe AKI (acute kidney injury) superimposed on CKD stage IIIb with hyperkalemia, AG metabolic acidosis - Baseline creatinine 1.6 on 02/08/2024, on admission creatinine 15.9-> 18.0 - CT abdomen showed moderate bilateral hydroureteronephrosis, ureters are dilated to the bladder insertion, no renal or ureteral calculi.  Enlarged prostate. -Foley catheter placed, initially creatinine improved after Foley placement however trending up again to 16.3  - Urology consulted, patient has aggressive bladder CA, likely the cause of bladder outlet obstruction.  Patient and his family had declined workup, treatment or surgery in the past. -Discussed with urology, recommended to continue Foley - will continue now for comfort   Severe hyperkalemia - Received albuterol  nebs, IV calcium  gluconate, sodium bicarb, Lokelma, received insulin/D50  - comfort care     Acute metabolic encephalopathy, myoclonic jerking movements likely due to uremia, #1  - Likely due to #1 - CT head showed no acute intracranial abnormality - Urine culture showed no growth - Comfort care     Atrial fibrillation, chronic, hypertension - Had history of cardioversion on 11/12/2021, follows Dr. Ladona  - HR in 50-60s, hold amiodarone  - Not on any anticoagulation - Comfort care     COPD (chronic obstructive pulmonary disease) (HCC) -Currently no wheezing, continue to follow    Anemia of chronic disease, normocytic - Baseline hemoglobin 13.0 on 02/08/2024,  - no active bleeding   History of aggressive urothelial carcinoma of bladder (HCC) - Now comfort care  Pressure Injury Documentation: POA Wound 03/17/24 2115 Pressure Injury Ischial tuberosity Left Stage 2 -  Partial thickness loss of dermis presenting as a shallow open injury with a red, pink wound bed without slough. (Active)  -Continue wound care   Underweight Estimated body mass  index is 18.54 kg/m as calculated from the following:   Height as of this encounter: 6' (1.829 m).   Weight as of this encounter: 62 kg.   DVT prophylaxis: None - continue comfort measures Code Status:   Code Status: Do not attempt resuscitation (DNR) - Comfort care Family Communication: At bedside  Status is: Inpatient  Dispo: The patient is from: Home              Anticipated d/c is to: Home with family              Anticipated d/c date is: Imminent pending safe disposition              Patient currently is medically stable for discharge  Consultants:  Urology, palliative care, neurology, nephrology  Antimicrobials:  None  Subjective: No acute issues or events reported overnight  Objective: Vitals:   03/20/24 1349 03/20/24 1925 03/21/24 1325 03/22/24 1343  BP: 132/63 (!) 150/122 127/61 132/69  Pulse: 61  66 63  Resp: 19  16 18   Temp: 97.9 F (36.6 C) 99 F (37.2 C) 98.2 F (36.8 C) 98.3 F (36.8 C)  TempSrc: Oral Oral Oral Oral  SpO2: 95%  91% 93%  Weight:      Height:        Intake/Output Summary (Last 24 hours) at 03/23/2024 0722 Last data filed at 03/22/2024 1012 Gross per 24 hour  Intake 240 ml  Output --  Net 240 ml   Filed Weights   03/17/24 1230  Weight: 62 kg    Examination:  General:  Pleasantly resting in bed, No acute distress. HEENT:  Normocephalic atraumatic.  Sclerae nonicteric, noninjected.  Extraocular movements intact bilaterally. Neck:  Without mass or deformity.  Trachea is midline. Lungs:  Clear to auscultate bilaterally without rhonchi, wheeze, or rales. Heart:  Regular rate and rhythm.  Without murmurs, rubs, or gallops. Abdomen:  Soft, nontender, nondistended.  Without guarding or rebound. Extremities: Without cyanosis, clubbing, edema, or obvious deformity.  Data Reviewed: I have personally reviewed following labs and imaging studies  CBC: Recent Labs  Lab 03/17/24 1258 03/17/24 1448 03/18/24 0623  WBC 7.3  --  8.8   HGB 11.2* 10.5* 10.3*  HCT 35.2* 31.0* 31.2*  MCV 89.3  --  87.2  PLT 208  --  184   Basic Metabolic Panel: Recent Labs  Lab 03/17/24 1258 03/17/24 1448 03/17/24 1743 03/18/24 0623  NA 131* 130* 133* 133*  K 6.1* 6.3* 4.9 5.7*  CL 96* 103 90* 94*  CO2 13*  --  25 21*  GLUCOSE 106* 95 467* 84  BUN 137* >130* 121* 137*  CREATININE 15.90* 18.00* 13.90* 16.30*  CALCIUM  8.5*  --  7.4* 8.3*   GFR: Estimated Creatinine Clearance: 2.9 mL/min (A) (by C-G formula based on SCr of 16.3 mg/dL (H)). Liver Function Tests: No results for input(s): AST, ALT, ALKPHOS, BILITOT, PROT, ALBUMIN in the last 168 hours. No results for input(s): LIPASE, AMYLASE in the last 168 hours. No results for input(s): AMMONIA in the last 168 hours. Coagulation Profile: No results for input(s): INR, PROTIME in the last 168 hours. Cardiac Enzymes: No results for input(s): CKTOTAL, CKMB, CKMBINDEX, TROPONINI in the last  168 hours. BNP (last 3 results) No results for input(s): PROBNP in the last 8760 hours. HbA1C: No results for input(s): HGBA1C in the last 72 hours. CBG: Recent Labs  Lab 03/17/24 2203  GLUCAP 100*   Lipid Profile: No results for input(s): CHOL, HDL, LDLCALC, TRIG, CHOLHDL, LDLDIRECT in the last 72 hours. Thyroid  Function Tests: No results for input(s): TSH, T4TOTAL, FREET4, T3FREE, THYROIDAB in the last 72 hours. Anemia Panel: No results for input(s): VITAMINB12, FOLATE, FERRITIN, TIBC, IRON, RETICCTPCT in the last 72 hours. Sepsis Labs: Recent Labs  Lab 03/17/24 1306 03/17/24 1743  LATICACIDVEN 1.2 1.6    Recent Results (from the past 240 hours)  Urine Culture     Status: None   Collection Time: 03/17/24  3:06 PM   Specimen: Urine, Clean Catch  Result Value Ref Range Status   Specimen Description   Final    URINE, CLEAN CATCH Performed at [facility], [address], [place], [place] [address]     Special Requests   Final    NONE Reflexed from [id number] Performed at [facility], [address], [place], [place] [address]    Culture   Final    NO GROWTH Performed at [facility], [address], [place], [place] [address]    Report Status 03/19/2024 FINAL  Final  Resp panel by RT-PCR (RSV, Flu A&B, Covid) Anterior Nasal Swab     Status: None   Collection Time: 03/17/24  3:39 PM   Specimen: Anterior Nasal Swab  Result Value Ref Range Status   SARS Coronavirus 2 by RT PCR NEGATIVE NEGATIVE Final    Comment: (NOTE) SARS-CoV-2 target nucleic acids are NOT DETECTED.  The SARS-CoV-2 RNA is generally detectable in upper respiratory specimens during the acute phase of infection. The lowest concentration of SARS-CoV-2 viral copies this assay can detect is 138 copies/mL. A negative result does not preclude SARS-Cov-2 infection and should not be used as the sole basis for treatment or other patient management decisions. A negative result may occur with  improper specimen collection/handling, submission of specimen other than nasopharyngeal swab, presence of viral mutation(s) within the areas targeted by this assay, and inadequate number of viral copies(<138 copies/mL). A negative result must be combined with clinical observations, patient history, and epidemiological information. The expected result is Negative.  Fact Sheet for Patients:  BloggerCourse.com  Fact Sheet for Healthcare Providers:  SeriousBroker.it  This test is no t yet approved or cleared by the [place]  FDA and  has been authorized for detection and/or diagnosis of SARS-CoV-2 by FDA under an Emergency Use Authorization (EUA). This EUA will remain  in effect (meaning this test can be used) for the duration of the COVID-19 declaration under Section 564(b)(1) of the Act, 21 U.S.C.section 360bbb-3(b)(1), unless the authorization is terminated   or revoked sooner.       Influenza A by PCR NEGATIVE NEGATIVE Final   Influenza B by PCR NEGATIVE NEGATIVE Final    Comment: (NOTE) The Xpert Xpress SARS-CoV-2/FLU/RSV plus assay is intended as an aid in the diagnosis of influenza from Nasopharyngeal swab specimens and should not be used as a sole basis for treatment. Nasal washings and aspirates are unacceptable for Xpert Xpress SARS-CoV-2/FLU/RSV testing.  Fact Sheet for Patients: BloggerCourse.com  Fact Sheet for Healthcare Providers: SeriousBroker.it  This test is not yet approved or cleared by the [place]  FDA and has been authorized for detection and/or diagnosis of SARS-CoV-2 by FDA under an  Emergency Use Authorization (EUA). This EUA will remain in effect (meaning this test can be used) for the duration of the COVID-19 declaration under Section 564(b)(1) of the Act, 21 U.S.C. section 360bbb-3(b)(1), unless the authorization is terminated or revoked.     Resp Syncytial Virus by PCR NEGATIVE NEGATIVE Final    Comment: (NOTE) Fact Sheet for Patients: BloggerCourse.com  Fact Sheet for Healthcare Providers: SeriousBroker.it  This test is not yet approved or cleared by the [place]  FDA and has been authorized for detection and/or diagnosis of SARS-CoV-2 by FDA under an Emergency Use Authorization (EUA). This EUA will remain in effect (meaning this test can be used) for the duration of the COVID-19 declaration under Section 564(b)(1) of the Act, 21 U.S.C. section 360bbb-3(b)(1), unless the authorization is terminated or revoked.  Performed at [facility], [address] [place], [place] [address]    Radiology Studies: No results found.  Scheduled Meds: Continuous Infusions:   LOS: 6 days   Time spent:  Elsie JAYSON Montclair, DO [facility]  If 7PM-7AM, please  contact night-coverage [email or web address]  03/23/2024, 7:22 AM

## 2024-03-24 DIAGNOSIS — N179 Acute kidney failure, unspecified: Secondary | ICD-10-CM | POA: Diagnosis not present

## 2024-03-24 MED ORDER — MORPHINE SULFATE (CONCENTRATE) 10 MG /0.5 ML PO SOLN: 5.0000 mg | ORAL | 0 refills | Status: DC | PRN

## 2024-03-24 MED ORDER — LORAZEPAM 2 MG/ML PO CONC: 1.0000 mg | ORAL | 0 refills | Status: DC | PRN

## 2024-03-24 NOTE — Discharge Summary (Signed)
 Physician Discharge Summary  LARY ECKARDT FMW:[id number] DOB: February 04, 1936 DOA: 03/17/2024  PCP: McClanahan, Kyra, NP  Admit date: 03/17/2024 Discharge date: 03/24/2024  Admitted From: Home Disposition: Home with hospice  Discharge Condition: Grim CODE STATUS: DNR/comfort Diet recommendation: Comfort feeding as tolerated  Brief/Interim Summary: Patient is a 88 y.o. male with atrial fibrillation, hypertension, CKD stage III, COPD, dementia, depression, hyperlipidemia presented to ED via EMS from home. Family had reported to the EMS that he was not acting his self and was confused. Due to altered mental status, patient was not able to provide good history.  He did report that he had no nausea vomiting or any diarrhea. At baseline ambulates with a walker.  Per family, patient was very weak and not able to stand up on his own.  EMS was concerned about UTI. Patient was not able to tell if he had any difficulty urinating.   Patient remains medically stable at this time, as patient approaches end-of-life family and multifaceted health care team continue to assess daily for possible disposition.  Current plan is for discharge to family's residence(new residence as of 10/15 and will need equipment delivered prior to discharge),  Discharge Diagnoses:  Principal Problem:   AKI (acute kidney injury) Active Problems:   CKD (chronic kidney disease), stage III (HCC)   HLD (hyperlipidemia)   COPD (chronic obstructive pulmonary disease) (HCC)   Essential hypertension   Atrial fibrillation, chronic (HCC)   Urothelial carcinoma of bladder (HCC)   Acute metabolic encephalopathy   Pain   High risk medication use   Hyperkalemia   Altered mental status   DNR (do not resuscitate)   Concern about end of life   ACP (advance care planning)   Counseling and coordination of care   Acute renal failure   Medication management   Palliative care encounter   Goals of care, counseling/discussion   Need for  emotional support   Goals of care, currently under comfort measures - Palliative medicine following, GOC 10/10, comfort care  - Unclear disposition, initial plan was residential hospice, now patient will be moving to daughter's home with hospice however daughter is changing residence on 10/15, TOC aware and assisting.   Severe AKI (acute kidney injury) superimposed on CKD stage IIIb with hyperkalemia, AG metabolic acidosis - Baseline creatinine 1.6 on 02/08/2024, on admission creatinine 15.9-> 18.0 - CT abdomen showed moderate bilateral hydroureteronephrosis, ureters are dilated to the bladder insertion, no renal or ureteral calculi.  Enlarged prostate. -Foley catheter placed, initially creatinine improved after Foley placement however trending up again to 16.3  - Urology consulted, patient has aggressive bladder CA, likely the cause of bladder outlet obstruction.  Patient and his family had declined workup, treatment or surgery in the past. -Discussed with urology, recommended to continue Foley - will continue now for comfort   Severe hyperkalemia - Received albuterol  nebs, IV calcium  gluconate, sodium bicarb, Lokelma, received insulin/D50  - comfort care     Acute metabolic encephalopathy, myoclonic jerking movements likely due to uremia, #1  - Likely due to #1 - CT head showed no acute intracranial abnormality - Urine culture showed no growth - Comfort care     Atrial fibrillation, chronic, hypertension - Had history of cardioversion on 11/12/2021, follows Dr. Ladona  - HR in 50-60s, hold amiodarone  - Not on any anticoagulation - Comfort care     COPD (chronic obstructive pulmonary disease) (HCC) -Currently no wheezing, continue to follow    Anemia of chronic disease, normocytic - Baseline  hemoglobin 13.0 on 02/08/2024,  - no active bleeding   History of aggressive urothelial carcinoma of bladder (HCC) - Now comfort care   Pressure Injury Documentation: POA Wound 03/17/24 2115  Pressure Injury Ischial tuberosity Left Stage 2 -  Partial thickness loss of dermis presenting as a shallow open injury with a red, pink wound bed without slough. (Active)  -Continue wound care   Underweight Estimated body mass index is 18.54 kg/m as calculated from the following:   Height as of this encounter: 6' (1.829 m).   Weight as of this encounter: 62 kg.  Discharge Instructions   Allergies as of 03/24/2024   No Known Allergies      Medication List     STOP taking these medications    amiodarone  100 MG tablet Commonly known as: PACERONE    cephALEXin  500 MG capsule Commonly known as: KEFLEX    FLUoxetine  20 MG capsule Commonly known as: PROZAC        TAKE these medications    LORazepam 2 MG/ML concentrated solution Commonly known as: LORazepam Intensol Take 0.5 mLs (1 mg total) by mouth every 4 (four) hours as needed for anxiety, seizure, sedation or sleep (comfort measures).   morphine CONCENTRATE 10 mg / 0.5 ml concentrated solution Place 0.25-0.5 mLs (5-10 mg total) under the tongue every 3 (three) hours as needed for up to 3 days for severe pain (pain score 7-10), anxiety or shortness of breath (Comfort measures).        No Known Allergies  Consultations: Palliative care Urology Nephrology Neurology  Procedures/Studies: CT ABDOMEN PELVIS WO CONTRAST Result Date: 03/17/2024 CLINICAL DATA:  New onset renal failure. EXAM: CT ABDOMEN AND PELVIS WITHOUT CONTRAST TECHNIQUE: Multidetector CT imaging of the abdomen and pelvis was performed following the standard protocol without IV contrast. RADIATION DOSE REDUCTION: This exam was performed according to the departmental dose-optimization program which includes automated exposure control, adjustment of the mA and/or kV according to patient size and/or use of iterative reconstruction technique. COMPARISON:  CT 07/10/2023 FINDINGS: Lower chest: Lower lobe bronchial thickening. Trace bilateral pleural effusions  with adjacent atelectasis. The heart is normal in size. Hepatobiliary: No evidence of focal liver abnormality on this unenhanced exam. Gallstone is well as high density material within the gallbladder lumen. No pericholecystic inflammation. No biliary dilatation. Pancreas: No ductal dilatation or inflammation. Spleen: Normal in size without focal abnormality. Adrenals/Urinary Tract: No adrenal nodule. Moderate bilateral hydroureteronephrosis. Ureters are dilated to the bladder insertion. No renal or ureteral calculi. Simple cyst in the left kidney. The urinary bladder is decompressed by Foley catheter. Elevation of the Foley balloon in the bladder may be due to wall thickening or heterogeneous contents. Stomach/Bowel: The stomach is decompressed. No small bowel obstruction or inflammation. Proximal colon is decompressed. Moderate stool within the left colon. Sigmoid colonic diverticula without diverticulitis. Vascular/Lymphatic: Aortic atherosclerosis. No aortic aneurysm. No bulky lymphadenopathy. Reproductive: Enlarged prostate. Other: Generalized retroperitoneal stranding with trace fluid. No free air. Mild generalized body wall edema. Musculoskeletal: Severe L1 compression fracture has progressive loss of height from January. Severe L3 compression fracture is unchanged. Mild superior endplate compression fracture of L5 is new from prior. Pronounced osteoporosis. Remote bilateral rib fractures. IMPRESSION: 1. Moderate bilateral hydroureteronephrosis. Ureters are dilated to the bladder insertion. No renal or ureteral calculi. 2. The urinary bladder is decompressed by Foley catheter. Elevation of the Foley balloon in the bladder may be due to wall thickening or heterogeneous contents. 3. Enlarged prostate. 4. Severe L1 compression fracture has progressive loss  of height from January. Severe L3 compression fracture is chronic. Mild superior endplate compression fracture of L5 is new from prior. 5. Cholelithiasis  without gallbladder inflammation. 6. Trace bilateral pleural effusions with adjacent atelectasis. Aortic Atherosclerosis (ICD10-I70.0). Electronically Signed   By: Andrea Gasman M.D.   On: 03/17/2024 16:52   CT Head Wo Contrast Result Date: 03/17/2024 CLINICAL DATA:  Altered mental status.  Weakness. EXAM: CT HEAD WITHOUT CONTRAST TECHNIQUE: Contiguous axial images were obtained from the base of the skull through the vertex without intravenous contrast. RADIATION DOSE REDUCTION: This exam was performed according to the departmental dose-optimization program which includes automated exposure control, adjustment of the mA and/or kV according to patient size and/or use of iterative reconstruction technique. COMPARISON:  02/08/2024 FINDINGS: Brain: No intracranial hemorrhage, mass effect, or midline shift. Stable atrophy and chronic small vessel ischemia. No hydrocephalus. The basilar cisterns are patent. No evidence of territorial infarct or acute ischemia. No extra-axial or intracranial fluid collection. Vascular: Atherosclerosis of skullbase vasculature without hyperdense vessel or abnormal calcification. Skull: No fracture or focal lesion. Sinuses/Orbits: No acute finding. Other: None. IMPRESSION: 1. No acute intracranial abnormality. 2. Stable atrophy and chronic small vessel ischemia. Electronically Signed   By: Andrea Gasman M.D.   On: 03/17/2024 14:30   DG Chest 1 View Result Date: 03/17/2024 CLINICAL DATA:  ams Patient BIB [facility] from home. Beginning last night has had generalized weakness. EXAM: CHEST  1 VIEW COMPARISON:  Chest XR, 06/11/2023.  CT chest, 03/20/2023. FINDINGS: Cardiac silhouette is within normal limits. Aortic arch atherosclerosis. Lungs are well inflated. No focal consolidation or mass. No pleural effusion or pneumothorax. Chronic-appearing RIGHT distal clavicular and multilevel RIGHT posterior rib fracture abnormalities. No acute displaced fracture. IMPRESSION: No acute  cardiopulmonary process. Electronically Signed   By: Thom Hall M.D.   On: 03/17/2024 14:11     Subjective: No acute issues or events overnight   Discharge Exam: Vitals:   03/23/24 1300 03/24/24 0228  BP: 130/86 135/72  Pulse: 60 72  Resp: 14 15  Temp: 98 F (36.7 C) 97.9 F (36.6 C)  SpO2: 94% 94%   Vitals:   03/21/24 1325 03/22/24 1343 03/23/24 1300 03/24/24 0228  BP: 127/61 132/69 130/86 135/72  Pulse: 66 63 60 72  Resp: 16 18 14 15   Temp: 98.2 F (36.8 C) 98.3 F (36.8 C) 98 F (36.7 C) 97.9 F (36.6 C)  TempSrc: Oral Oral Oral Oral  SpO2: 91% 93% 94% 94%  Weight:      Height:        General: Pt is alert, awake, not in acute distress Cardiovascular: RRR, S1/S2 +, no rubs, no gallops Respiratory: CTA bilaterally, no wheezing, no rhonchi Abdominal: Soft, NT, ND, bowel sounds +; Foley catheter intact Extremities: no edema, no cyanosis    The results of significant diagnostics from this hospitalization (including imaging, microbiology, ancillary and laboratory) are listed below for reference.     Microbiology: Recent Results (from the past 240 hours)  Urine Culture     Status: None   Collection Time: 03/17/24  3:06 PM   Specimen: Urine, Clean Catch  Result Value Ref Range Status   Specimen Description   Final    URINE, CLEAN CATCH Performed at [facility], [address], [place], [place] [address]    Special Requests   Final    NONE Reflexed from [id number] Performed at [facility], [address], [place], [place] [address]    Culture  Final    NO GROWTH Performed at [facility], [address] [place], [place] [address]    Report Status 03/19/2024 FINAL  Final  Resp panel by RT-PCR (RSV, Flu A&B, Covid) Anterior Nasal Swab     Status: None   Collection Time: 03/17/24  3:39 PM   Specimen: Anterior Nasal Swab  Result Value Ref Range Status   SARS Coronavirus 2 by RT PCR NEGATIVE NEGATIVE Final    Comment:  (NOTE) SARS-CoV-2 target nucleic acids are NOT DETECTED.  The SARS-CoV-2 RNA is generally detectable in upper respiratory specimens during the acute phase of infection. The lowest concentration of SARS-CoV-2 viral copies this assay can detect is 138 copies/mL. A negative result does not preclude SARS-Cov-2 infection and should not be used as the sole basis for treatment or other patient management decisions. A negative result may occur with  improper specimen collection/handling, submission of specimen other than nasopharyngeal swab, presence of viral mutation(s) within the areas targeted by this assay, and inadequate number of viral copies(<138 copies/mL). A negative result must be combined with clinical observations, patient history, and epidemiological information. The expected result is Negative.  Fact Sheet for Patients:  BloggerCourse.com  Fact Sheet for Healthcare Providers:  SeriousBroker.it  This test is no t yet approved or cleared by the [place]  FDA and  has been authorized for detection and/or diagnosis of SARS-CoV-2 by FDA under an Emergency Use Authorization (EUA). This EUA will remain  in effect (meaning this test can be used) for the duration of the COVID-19 declaration under Section 564(b)(1) of the Act, 21 U.S.C.section 360bbb-3(b)(1), unless the authorization is terminated  or revoked sooner.       Influenza A by PCR NEGATIVE NEGATIVE Final   Influenza B by PCR NEGATIVE NEGATIVE Final    Comment: (NOTE) The Xpert Xpress SARS-CoV-2/FLU/RSV plus assay is intended as an aid in the diagnosis of influenza from Nasopharyngeal swab specimens and should not be used as a sole basis for treatment. Nasal washings and aspirates are unacceptable for Xpert Xpress SARS-CoV-2/FLU/RSV testing.  Fact Sheet for Patients: BloggerCourse.com  Fact Sheet for Healthcare  Providers: SeriousBroker.it  This test is not yet approved or cleared by the [place]  FDA and has been authorized for detection and/or diagnosis of SARS-CoV-2 by FDA under an Emergency Use Authorization (EUA). This EUA will remain in effect (meaning this test can be used) for the duration of the COVID-19 declaration under Section 564(b)(1) of the Act, 21 U.S.C. section 360bbb-3(b)(1), unless the authorization is terminated or revoked.     Resp Syncytial Virus by PCR NEGATIVE NEGATIVE Final    Comment: (NOTE) Fact Sheet for Patients: BloggerCourse.com  Fact Sheet for Healthcare Providers: SeriousBroker.it  This test is not yet approved or cleared by the [place]  FDA and has been authorized for detection and/or diagnosis of SARS-CoV-2 by FDA under an Emergency Use Authorization (EUA). This EUA will remain in effect (meaning this test can be used) for the duration of the COVID-19 declaration under Section 564(b)(1) of the Act, 21 U.S.C. section 360bbb-3(b)(1), unless the authorization is terminated or revoked.  Performed at [facility], [address], [place], [place] [address]      Labs: BNP (last 3 results) No results for input(s): BNP in the last 8760 hours. Basic Metabolic Panel: Recent Labs  Lab 03/17/24 1258 03/17/24 1448 03/17/24 1743 03/18/24 0623  NA 131* 130* 133* 133*  K 6.1* 6.3* 4.9 5.7*  CL 96* 103 90*  94*  CO2 13*  --  25 21*  GLUCOSE 106* 95 467* 84  BUN 137* >130* 121* 137*  CREATININE 15.90* 18.00* 13.90* 16.30*  CALCIUM  8.5*  --  7.4* 8.3*   Liver Function Tests: No results for input(s): AST, ALT, ALKPHOS, BILITOT, PROT, ALBUMIN in the last 168 hours. No results for input(s): LIPASE, AMYLASE in the last 168 hours. No results for input(s): AMMONIA in the last 168 hours. CBC: Recent Labs  Lab 03/17/24 1258 03/17/24 1448  03/18/24 0623  WBC 7.3  --  8.8  HGB 11.2* 10.5* 10.3*  HCT 35.2* 31.0* 31.2*  MCV 89.3  --  87.2  PLT 208  --  184   Cardiac Enzymes: No results for input(s): CKTOTAL, CKMB, CKMBINDEX, TROPONINI in the last 168 hours. BNP: Invalid input(s): POCBNP CBG: Recent Labs  Lab 03/17/24 2203  GLUCAP 100*   D-Dimer No results for input(s): DDIMER in the last 72 hours. Hgb A1c No results for input(s): HGBA1C in the last 72 hours. Lipid Profile No results for input(s): CHOL, HDL, LDLCALC, TRIG, CHOLHDL, LDLDIRECT in the last 72 hours. Thyroid  function studies No results for input(s): TSH, T4TOTAL, T3FREE, THYROIDAB in the last 72 hours.  Invalid input(s): FREET3 Anemia work up No results for input(s): VITAMINB12, FOLATE, FERRITIN, TIBC, IRON, RETICCTPCT in the last 72 hours. Urinalysis    Component Value Date/Time   COLORURINE YELLOW 03/17/2024 1506   APPEARANCEUR CLOUDY (A) 03/17/2024 1506   APPEARANCEUR Clear 09/19/2021 1337   LABSPEC 1.011 03/17/2024 1506   PHURINE 8.0 03/17/2024 1506   GLUCOSEU 50 (A) 03/17/2024 1506   GLUCOSEU NEGATIVE 06/18/2009 1347   HGBUR MODERATE (A) 03/17/2024 1506   BILIRUBINUR NEGATIVE 03/17/2024 1506   BILIRUBINUR Negative 09/19/2021 1337   KETONESUR NEGATIVE 03/17/2024 1506   PROTEINUR >=300 (A) 03/17/2024 1506   UROBILINOGEN 0.2 06/18/2009 1347   NITRITE NEGATIVE 03/17/2024 1506   LEUKOCYTESUR TRACE (A) 03/17/2024 1506   Sepsis Labs Recent Labs  Lab 03/17/24 1258 03/18/24 0623  WBC 7.3 8.8   Microbiology Recent Results (from the past 240 hours)  Urine Culture     Status: None   Collection Time: 03/17/24  3:06 PM   Specimen: Urine, Clean Catch  Result Value Ref Range Status   Specimen Description   Final    URINE, CLEAN CATCH Performed at [facility], [address], [place], [place] [address]    Special Requests   Final    NONE Reflexed from [id number] Performed at [facility], [address] [place], [place] [address]    Culture   Final    NO GROWTH Performed at [facility], [address] [place], [place] [address]    Report Status 03/19/2024 FINAL  Final  Resp panel by RT-PCR (RSV, Flu A&B, Covid) Anterior Nasal Swab     Status: None   Collection Time: 03/17/24  3:39 PM   Specimen: Anterior Nasal Swab  Result Value Ref Range Status   SARS Coronavirus 2 by RT PCR NEGATIVE NEGATIVE Final    Comment: (NOTE) SARS-CoV-2 target nucleic acids are NOT DETECTED.  The SARS-CoV-2 RNA is generally detectable in upper respiratory specimens during the acute phase of infection. The lowest concentration of SARS-CoV-2 viral copies this assay can detect is 138 copies/mL. A negative result does not preclude SARS-Cov-2 infection and should not be used as the sole basis for treatment or other patient management decisions. A negative result may occur with  improper  specimen collection/handling, submission of specimen other than nasopharyngeal swab, presence of viral mutation(s) within the areas targeted by this assay, and inadequate number of viral copies(<138 copies/mL). A negative result must be combined with clinical observations, patient history, and epidemiological information. The expected result is Negative.  Fact Sheet for Patients:  BloggerCourse.com  Fact Sheet for Healthcare Providers:  SeriousBroker.it  This test is no t yet approved or cleared by the [place]  FDA and  has been authorized for detection and/or diagnosis of SARS-CoV-2 by FDA under an Emergency Use Authorization (EUA). This EUA will remain  in effect (meaning this test can be used) for the duration of the COVID-19 declaration under Section 564(b)(1) of the Act, 21 U.S.C.section 360bbb-3(b)(1), unless the authorization is terminated  or revoked sooner.       Influenza A by PCR NEGATIVE NEGATIVE Final    Influenza B by PCR NEGATIVE NEGATIVE Final    Comment: (NOTE) The Xpert Xpress SARS-CoV-2/FLU/RSV plus assay is intended as an aid in the diagnosis of influenza from Nasopharyngeal swab specimens and should not be used as a sole basis for treatment. Nasal washings and aspirates are unacceptable for Xpert Xpress SARS-CoV-2/FLU/RSV testing.  Fact Sheet for Patients: BloggerCourse.com  Fact Sheet for Healthcare Providers: SeriousBroker.it  This test is not yet approved or cleared by the [place]  FDA and has been authorized for detection and/or diagnosis of SARS-CoV-2 by FDA under an Emergency Use Authorization (EUA). This EUA will remain in effect (meaning this test can be used) for the duration of the COVID-19 declaration under Section 564(b)(1) of the Act, 21 U.S.C. section 360bbb-3(b)(1), unless the authorization is terminated or revoked.     Resp Syncytial Virus by PCR NEGATIVE NEGATIVE Final    Comment: (NOTE) Fact Sheet for Patients: BloggerCourse.com  Fact Sheet for Healthcare Providers: SeriousBroker.it  This test is not yet approved or cleared by the [place]  FDA and has been authorized for detection and/or diagnosis of SARS-CoV-2 by FDA under an Emergency Use Authorization (EUA). This EUA will remain in effect (meaning this test can be used) for the duration of the COVID-19 declaration under Section 564(b)(1) of the Act, 21 U.S.C. section 360bbb-3(b)(1), unless the authorization is terminated or revoked.  Performed at [facility], [address], [place], [place] [address]      Time coordinating discharge: Over 30 minutes  SIGNED:   Elsie JAYSON Montclair, DO [facility] 03/24/2024, 12:34 PM Pager   If 7PM-7AM, please contact night-coverage [email or web address]

## 2024-03-24 NOTE — Progress Notes (Signed)
 Daily Progress Note   Patient Name: Richard Stanley       Date: 03/24/2024 DOB: 1936-01-15  Age: 88 y.o. MRN#: [id number] Attending Physician: Lue Elsie BROCKS, MD Primary Care Physician: Wallie Drafts, NP Admit Date: 03/17/2024 Length of Stay: 7 days  Reason for Consultation/Follow-up: Establishing goals of care  Subjective:   CC: Patient resting in bed Not in acute distress currently.    Following up regarding complex medical decision making.  Subjective:  Reviewed EMR including recent documentation from hospitalist.     Presented to bedside to see patient.  Patient's wife sitting at bedside.  Patient lethargic but awakens at times Not showing any non verbal gestures of distress or discomfort.    Patient with less myoclonic jerking at times.    Discharge plan remains for home with hospice in daughter's home in [place], [place]. Appreciate TOC follow up.   Continuing comfort focused care at this time.  Spent time providing emotional support via active listening.    Discussed end of life signs and symptoms.     Noted palliative medicine team continue to follow along with patient's medical journey.  Objective:   Vital Signs:  BP 135/72 (BP Location: Left Arm)   Pulse 72   Temp 97.9 F (36.6 C) (Oral)   Resp 15   Ht 6' (1.829 m)   Wt 62 kg   SpO2 94%   BMI 18.54 kg/m   Physical Exam: General: Ill-appearing, lethargic though will awake, hard of hearing, fewer myoclonic jerking   Cardiovascular: RRR Respiratory: no increased work of breathing noted, not in respiratory distress Abdomen: not distended Skin: Multiple ecchymoses on upper extremities bilaterally Neuro:   lethargic at times  Assessment & Plan:   Assessment: Patient is a 88 year old male with past medical history of atrial fibrillation, hypertension, CKD stage III, COPD, dementia, depression, hyperlipidemia, and aggressive bladder cancer who was admitted on 03/17/2024 for management of AMS and generalized  weakness.  Patient has history of TURBT by urology in March 2025 which showed concern and was recommended for rebiopsy as well as radical cystectomy though patient and wife declined this intervention; noted they wanted to focus on quality of life with his advanced age.  Since admission, patient receiving management for acute renal failure superimposed on CKD, metabolic acidosis, severe hyperkalemia, acute metabolic encephalopathy and myoclonic jerking likely secondary to uremia from acute renal failure, and possible urinary tract infection.  Urology, neurology, and nephrology consulted for recommendations.  Palliative medicine team consulted to assist with complex medical decision making.   Recommendations/Plan: # Complex medical decision making/goals of care:   -Patient unable to participate in medical decision making secondary to mental status.                - Discussed care with patient's wife at bedside today    Had transitioned to comfort focused care on 03/18/2024 as per wife's wishes for the patient.  Continuing comfort focused care at this time.        Palliative medicine team continuing to follow along with patient's medical journey.                - Have discontinued interventions that are no longer focused on comfort such as IV fluids, imaging, or lab work.  Concentrating on symptom management of pain, dyspnea, and agitation in the setting of end-of-life care.                  Code Status: Do not attempt resuscitation (  DNR) - Comfort care   # Symptom management Patient is receiving these palliative interventions for symptom management with an intent to improve quality of life.                   -Pain/Dyspnea, acute in the setting of end-of-life care                               -Continue IV Dilaudid 0.5-2 mg IV every 30 minutes as needed.  Continue to adjust based on patient's symptom burden.  If patient needing frequent dosing, may need to consider continuous infusion.                   -Anxiety/agitation, in the setting of end-of-life care                               -Continue as needed Ativan.  Continue to adjust based on patient's symptom burden.                  -Secretions, in the setting of end-of-life care                               -Continue as needed glycopyrrolate.    # Psycho-social/Spiritual Support:  - Support System: Wife, adopted daughter, son who lives in [place]  - Desire for further Chaplain support:yes Appreciate chaplain follow up.   # Discharge Planning: To Be Determined  - Continuing comfort focused care at this time.  Either anticipated in-hospital death or transfer to home with hospice at daughter's home in [place], [place].  Wife has questions regarding making an anatomical donation of his body after his death - online information handout given.  [facility] has been consulted to help with the home with hospice arrangements.    Discussed with:  patient's wife    Thank you for allowing the palliative care team to participate in the care [name]. Mod MDM Lonia Serve MD.  Palliative Care Provider PMT # [phone]  If patient remains symptomatic despite maximum doses, please call PMT at [phone] between 0700 and 1900. Outside of these hours, please call attending, as PMT does not have night coverage.

## 2024-03-24 NOTE — TOC Progression Note (Addendum)
 Transition of Care [facility]) - Progression Note    Patient Details  Name: Richard Stanley MRN: [id number] Date of Birth: [birth year]  [facility] CM/SW Contact  Ravneet Spilker, Nathanel, RN Phone Number: 03/24/2024, 3:45 PM  Clinical Narrative: Beatris to Susan(spouse) about d/c plans-for d/c home w/hospice with [facility]-awaiting dtr's address prior dme delivery for safe d/c. [facility] rep Kwante has also contacted Susan(spouse) who is awaiting confirmation of address for dme delivery to home in [place]. [facility] rep is able to pull d/c summary from epic.Per spouse likely in am her dtr will have info available.      Expected Discharge Plan: Home w Hospice Care Barriers to Discharge: Other (must enter comment) (awaiting dtr's address where patient will d/c to home in [place])               Expected Discharge Plan and Services In-house Referral: Clinical Social Work, Hospice / Palliative Care   Post Acute Care Choice: Hospice Living arrangements for the past 2 months: Apartment                 DME Arranged: N/A DME Agency: NA                   Social Drivers of Health (SDOH) Interventions SDOH Screenings   Food Insecurity: No Food Insecurity (03/17/2024)  Housing: Low Risk  (03/17/2024)  Transportation Needs: No Transportation Needs (03/17/2024)  Utilities: Not At Risk (03/17/2024)  Financial Resource Strain: Low Risk  (02/17/2024)   Received from [facility]  Physical Activity: Unknown (05/13/2023)   Received from [facility]  Social Connections: Unknown (03/17/2024)  Stress: No Stress Concern Present (05/13/2023)   Received from [facility]  Tobacco Use: Medium Risk (03/17/2024)    Readmission Risk Interventions     No data to display

## 2024-03-25 DIAGNOSIS — N179 Acute kidney failure, unspecified: Secondary | ICD-10-CM | POA: Diagnosis not present

## 2024-03-25 NOTE — Discharge Summary (Signed)
 Physician Discharge Summary  Richard Stanley FMW:[id number] DOB: February 20, 1936 DOA: 03/17/2024  PCP: McClanahan, Kyra, NP  Admit date: 03/17/2024 Discharge date: 03/25/2024  Admitted From: Home Disposition: Home with hospice  Discharge Condition: Grim CODE STATUS: DNR/comfort Diet recommendation: Comfort feeding as tolerated  Brief/Interim Summary: Patient is a 88 y.o. male with atrial fibrillation, hypertension, CKD stage III, COPD, dementia, depression, hyperlipidemia presented to ED via EMS from home. Family had reported to the EMS that he was not acting his self and was confused. Due to altered mental status, patient was not able to provide good history.  He did report that he had no nausea vomiting or any diarrhea. At baseline ambulates with a walker.  Per family, patient was very weak and not able to stand up on his own.  EMS was concerned about UTI. Patient was not able to tell if he had any difficulty urinating.   Patient remains medically stable at this time, as patient approaches end-of-life family and multifaceted health care team continue to assess daily for possible disposition.  Current plan is for discharge to family's residence(new residence as of 10/15 and will need equipment delivered prior to discharge),  Discharge Diagnoses:  Principal Problem:   AKI (acute kidney injury) Active Problems:   CKD (chronic kidney disease), stage III (HCC)   HLD (hyperlipidemia)   COPD (chronic obstructive pulmonary disease) (HCC)   Essential hypertension   Atrial fibrillation, chronic (HCC)   Urothelial carcinoma of bladder (HCC)   Acute metabolic encephalopathy   Pain   High risk medication use   Hyperkalemia   Altered mental status   DNR (do not resuscitate)   Concern about end of life   ACP (advance care planning)   Counseling and coordination of care   Acute renal failure   Medication management   Palliative care encounter   Goals of care, counseling/discussion   Need for  emotional support   Goals of care, currently under comfort measures - Palliative medicine following, GOC 10/10, comfort care  - Unclear disposition, initial plan was residential hospice, now patient will be moving to daughter's home with hospice however daughter is changing residence on 10/15, TOC aware and assisting.   Severe AKI (acute kidney injury) superimposed on CKD stage IIIb with hyperkalemia, AG metabolic acidosis - Baseline creatinine 1.6 on 02/08/2024, on admission creatinine 15.9-> 18.0 - CT abdomen showed moderate bilateral hydroureteronephrosis, ureters are dilated to the bladder insertion, no renal or ureteral calculi.  Enlarged prostate. -Foley catheter placed, initially creatinine improved after Foley placement however trending up again to 16.3  - Urology consulted, patient has aggressive bladder CA, likely the cause of bladder outlet obstruction.  Patient and his family had declined workup, treatment or surgery in the past. -Discussed with urology, recommended to continue Foley - will continue now for comfort   Severe hyperkalemia - Received albuterol  nebs, IV calcium  gluconate, sodium bicarb, Lokelma, received insulin/D50  - comfort care     Acute metabolic encephalopathy, myoclonic jerking movements likely due to uremia, #1  - Likely due to #1 - CT head showed no acute intracranial abnormality - Urine culture showed no growth - Comfort care     Atrial fibrillation, chronic, hypertension - Had history of cardioversion on 11/12/2021, follows Dr. Ladona  - HR in 50-60s, hold amiodarone  - Not on any anticoagulation - Comfort care     COPD (chronic obstructive pulmonary disease) (HCC) -Currently no wheezing, continue to follow    Anemia of chronic disease, normocytic - Baseline  hemoglobin 13.0 on 02/08/2024,  - no active bleeding   History of aggressive urothelial carcinoma of bladder (HCC) - Now comfort care   Pressure Injury Documentation: POA Wound 03/17/24 2115  Pressure Injury Ischial tuberosity Left Stage 2 -  Partial thickness loss of dermis presenting as a shallow open injury with a red, pink wound bed without slough. (Active)  -Continue wound care   Underweight Estimated body mass index is 18.54 kg/m as calculated from the following:   Height as of this encounter: 6' (1.829 m).   Weight as of this encounter: 62 kg.  Discharge Instructions   Allergies as of 03/25/2024   No Known Allergies      Medication List     STOP taking these medications    amiodarone  100 MG tablet Commonly known as: PACERONE    cephALEXin  500 MG capsule Commonly known as: KEFLEX    FLUoxetine  20 MG capsule Commonly known as: PROZAC        TAKE these medications    LORazepam 2 MG/ML concentrated solution Commonly known as: LORazepam Intensol Take 0.5 mLs (1 mg total) by mouth every 4 (four) hours as needed for anxiety, seizure, sedation or sleep (comfort measures).   morphine CONCENTRATE 10 mg / 0.5 ml concentrated solution Place 0.25-0.5 mLs (5-10 mg total) under the tongue every 3 (three) hours as needed for up to 3 days for severe pain (pain score 7-10), anxiety or shortness of breath (Comfort measures).        No Known Allergies  Consultations: Palliative care Urology Nephrology Neurology  Procedures/Studies: CT ABDOMEN PELVIS WO CONTRAST Result Date: 03/17/2024 CLINICAL DATA:  New onset renal failure. EXAM: CT ABDOMEN AND PELVIS WITHOUT CONTRAST TECHNIQUE: Multidetector CT imaging of the abdomen and pelvis was performed following the standard protocol without IV contrast. RADIATION DOSE REDUCTION: This exam was performed according to the departmental dose-optimization program which includes automated exposure control, adjustment of the mA and/or kV according to patient size and/or use of iterative reconstruction technique. COMPARISON:  CT 07/10/2023 FINDINGS: Lower chest: Lower lobe bronchial thickening. Trace bilateral pleural effusions  with adjacent atelectasis. The heart is normal in size. Hepatobiliary: No evidence of focal liver abnormality on this unenhanced exam. Gallstone is well as high density material within the gallbladder lumen. No pericholecystic inflammation. No biliary dilatation. Pancreas: No ductal dilatation or inflammation. Spleen: Normal in size without focal abnormality. Adrenals/Urinary Tract: No adrenal nodule. Moderate bilateral hydroureteronephrosis. Ureters are dilated to the bladder insertion. No renal or ureteral calculi. Simple cyst in the left kidney. The urinary bladder is decompressed by Foley catheter. Elevation of the Foley balloon in the bladder may be due to wall thickening or heterogeneous contents. Stomach/Bowel: The stomach is decompressed. No small bowel obstruction or inflammation. Proximal colon is decompressed. Moderate stool within the left colon. Sigmoid colonic diverticula without diverticulitis. Vascular/Lymphatic: Aortic atherosclerosis. No aortic aneurysm. No bulky lymphadenopathy. Reproductive: Enlarged prostate. Other: Generalized retroperitoneal stranding with trace fluid. No free air. Mild generalized body wall edema. Musculoskeletal: Severe L1 compression fracture has progressive loss of height from January. Severe L3 compression fracture is unchanged. Mild superior endplate compression fracture of L5 is new from prior. Pronounced osteoporosis. Remote bilateral rib fractures. IMPRESSION: 1. Moderate bilateral hydroureteronephrosis. Ureters are dilated to the bladder insertion. No renal or ureteral calculi. 2. The urinary bladder is decompressed by Foley catheter. Elevation of the Foley balloon in the bladder may be due to wall thickening or heterogeneous contents. 3. Enlarged prostate. 4. Severe L1 compression fracture has progressive loss  of height from January. Severe L3 compression fracture is chronic. Mild superior endplate compression fracture of L5 is new from prior. 5. Cholelithiasis  without gallbladder inflammation. 6. Trace bilateral pleural effusions with adjacent atelectasis. Aortic Atherosclerosis (ICD10-I70.0). Electronically Signed   By: Andrea Gasman M.D.   On: 03/17/2024 16:52   CT Head Wo Contrast Result Date: 03/17/2024 CLINICAL DATA:  Altered mental status.  Weakness. EXAM: CT HEAD WITHOUT CONTRAST TECHNIQUE: Contiguous axial images were obtained from the base of the skull through the vertex without intravenous contrast. RADIATION DOSE REDUCTION: This exam was performed according to the departmental dose-optimization program which includes automated exposure control, adjustment of the mA and/or kV according to patient size and/or use of iterative reconstruction technique. COMPARISON:  02/08/2024 FINDINGS: Brain: No intracranial hemorrhage, mass effect, or midline shift. Stable atrophy and chronic small vessel ischemia. No hydrocephalus. The basilar cisterns are patent. No evidence of territorial infarct or acute ischemia. No extra-axial or intracranial fluid collection. Vascular: Atherosclerosis of skullbase vasculature without hyperdense vessel or abnormal calcification. Skull: No fracture or focal lesion. Sinuses/Orbits: No acute finding. Other: None. IMPRESSION: 1. No acute intracranial abnormality. 2. Stable atrophy and chronic small vessel ischemia. Electronically Signed   By: Andrea Gasman M.D.   On: 03/17/2024 14:30   DG Chest 1 View Result Date: 03/17/2024 CLINICAL DATA:  ams Patient BIB [facility] from home. Beginning last night has had generalized weakness. EXAM: CHEST  1 VIEW COMPARISON:  Chest XR, 06/11/2023.  CT chest, 03/20/2023. FINDINGS: Cardiac silhouette is within normal limits. Aortic arch atherosclerosis. Lungs are well inflated. No focal consolidation or mass. No pleural effusion or pneumothorax. Chronic-appearing RIGHT distal clavicular and multilevel RIGHT posterior rib fracture abnormalities. No acute displaced fracture. IMPRESSION: No acute  cardiopulmonary process. Electronically Signed   By: Thom Hall M.D.   On: 03/17/2024 14:11     Subjective: No acute issues or events overnight   Discharge Exam: Vitals:   03/25/24 0539 03/25/24 0542  BP: 131/62 131/62  Pulse: 71 71  Resp:  16  Temp: 98 F (36.7 C) 98 F (36.7 C)  SpO2: 91% 91%   Vitals:   03/24/24 2312 03/24/24 2315 03/25/24 0539 03/25/24 0542  BP: (!) 139/56 (!) 139/56 131/62 131/62  Pulse: 62 62 71 71  Resp:  16  16  Temp:  97.9 F (36.6 C) 98 F (36.7 C) 98 F (36.7 C)  TempSrc:  Oral Oral Oral  SpO2: 90% 90% 91% 91%  Weight:      Height:        General: Pt is alert, awake, not in acute distress Cardiovascular: RRR, S1/S2 +, no rubs, no gallops Respiratory: CTA bilaterally, no wheezing, no rhonchi Abdominal: Soft, NT, ND, bowel sounds +; Foley catheter intact Extremities: no edema, no cyanosis    The results of significant diagnostics from this hospitalization (including imaging, microbiology, ancillary and laboratory) are listed below for reference.     Microbiology: Recent Results (from the past 240 hours)  Urine Culture     Status: None   Collection Time: 03/17/24  3:06 PM   Specimen: Urine, Clean Catch  Result Value Ref Range Status   Specimen Description   Final    URINE, CLEAN CATCH Performed at [facility], [address], [place], [place] [address]    Special Requests   Final    NONE Reflexed from [id number] Performed at [facility], [address], [place], [place] [address]    Culture  Final    NO GROWTH Performed at [facility], [address] [place], [place] [address]    Report Status 03/19/2024 FINAL  Final  Resp panel by RT-PCR (RSV, Flu A&B, Covid) Anterior Nasal Swab     Status: None   Collection Time: 03/17/24  3:39 PM   Specimen: Anterior Nasal Swab  Result Value Ref Range Status   SARS Coronavirus 2 by RT PCR NEGATIVE NEGATIVE Final    Comment: (NOTE) SARS-CoV-2 target  nucleic acids are NOT DETECTED.  The SARS-CoV-2 RNA is generally detectable in upper respiratory specimens during the acute phase of infection. The lowest concentration of SARS-CoV-2 viral copies this assay can detect is 138 copies/mL. A negative result does not preclude SARS-Cov-2 infection and should not be used as the sole basis for treatment or other patient management decisions. A negative result may occur with  improper specimen collection/handling, submission of specimen other than nasopharyngeal swab, presence of viral mutation(s) within the areas targeted by this assay, and inadequate number of viral copies(<138 copies/mL). A negative result must be combined with clinical observations, patient history, and epidemiological information. The expected result is Negative.  Fact Sheet for Patients:  BloggerCourse.com  Fact Sheet for Healthcare Providers:  SeriousBroker.it  This test is no t yet approved or cleared by the [place]  FDA and  has been authorized for detection and/or diagnosis of SARS-CoV-2 by FDA under an Emergency Use Authorization (EUA). This EUA will remain  in effect (meaning this test can be used) for the duration of the COVID-19 declaration under Section 564(b)(1) of the Act, 21 U.S.C.section 360bbb-3(b)(1), unless the authorization is terminated  or revoked sooner.       Influenza A by PCR NEGATIVE NEGATIVE Final   Influenza B by PCR NEGATIVE NEGATIVE Final    Comment: (NOTE) The Xpert Xpress SARS-CoV-2/FLU/RSV plus assay is intended as an aid in the diagnosis of influenza from Nasopharyngeal swab specimens and should not be used as a sole basis for treatment. Nasal washings and aspirates are unacceptable for Xpert Xpress SARS-CoV-2/FLU/RSV testing.  Fact Sheet for Patients: BloggerCourse.com  Fact Sheet for Healthcare  Providers: SeriousBroker.it  This test is not yet approved or cleared by the [place]  FDA and has been authorized for detection and/or diagnosis of SARS-CoV-2 by FDA under an Emergency Use Authorization (EUA). This EUA will remain in effect (meaning this test can be used) for the duration of the COVID-19 declaration under Section 564(b)(1) of the Act, 21 U.S.C. section 360bbb-3(b)(1), unless the authorization is terminated or revoked.     Resp Syncytial Virus by PCR NEGATIVE NEGATIVE Final    Comment: (NOTE) Fact Sheet for Patients: BloggerCourse.com  Fact Sheet for Healthcare Providers: SeriousBroker.it  This test is not yet approved or cleared by the [place]  FDA and has been authorized for detection and/or diagnosis of SARS-CoV-2 by FDA under an Emergency Use Authorization (EUA). This EUA will remain in effect (meaning this test can be used) for the duration of the COVID-19 declaration under Section 564(b)(1) of the Act, 21 U.S.C. section 360bbb-3(b)(1), unless the authorization is terminated or revoked.  Performed at [facility], [address], [place], [place] [address]      Labs: BNP (last 3 results) No results for input(s): BNP in the last 8760 hours. Basic Metabolic Panel: No results for input(s): NA, K, CL, CO2, GLUCOSE, BUN, CREATININE, CALCIUM , MG, PHOS in the last 168 hours.  Liver Function Tests: No results for input(s): AST, ALT,  ALKPHOS, BILITOT, PROT, ALBUMIN in the last 168 hours. No results for input(s): LIPASE, AMYLASE in the last 168 hours. No results for input(s): AMMONIA in the last 168 hours. CBC: No results for input(s): WBC, NEUTROABS, HGB, HCT, MCV, PLT in the last 168 hours.  Cardiac Enzymes: No results for input(s): CKTOTAL, CKMB, CKMBINDEX, TROPONINI in the last 168  hours. BNP: Invalid input(s): POCBNP CBG: No results for input(s): GLUCAP in the last 168 hours.  D-Dimer No results for input(s): DDIMER in the last 72 hours. Hgb A1c No results for input(s): HGBA1C in the last 72 hours. Lipid Profile No results for input(s): CHOL, HDL, LDLCALC, TRIG, CHOLHDL, LDLDIRECT in the last 72 hours. Thyroid  function studies No results for input(s): TSH, T4TOTAL, T3FREE, THYROIDAB in the last 72 hours.  Invalid input(s): FREET3 Anemia work up No results for input(s): VITAMINB12, FOLATE, FERRITIN, TIBC, IRON, RETICCTPCT in the last 72 hours. Urinalysis    Component Value Date/Time   COLORURINE YELLOW 03/17/2024 1506   APPEARANCEUR CLOUDY (A) 03/17/2024 1506   APPEARANCEUR Clear 09/19/2021 1337   LABSPEC 1.011 03/17/2024 1506   PHURINE 8.0 03/17/2024 1506   GLUCOSEU 50 (A) 03/17/2024 1506   GLUCOSEU NEGATIVE 06/18/2009 1347   HGBUR MODERATE (A) 03/17/2024 1506   BILIRUBINUR NEGATIVE 03/17/2024 1506   BILIRUBINUR Negative 09/19/2021 1337   KETONESUR NEGATIVE 03/17/2024 1506   PROTEINUR >=300 (A) 03/17/2024 1506   UROBILINOGEN 0.2 06/18/2009 1347   NITRITE NEGATIVE 03/17/2024 1506   LEUKOCYTESUR TRACE (A) 03/17/2024 1506   Sepsis Labs No results for input(s): WBC in the last 168 hours.  Invalid input(s): PROCALCITONIN, LACTICIDVEN  Microbiology Recent Results (from the past 240 hours)  Urine Culture     Status: None   Collection Time: 03/17/24  3:06 PM   Specimen: Urine, Clean Catch  Result Value Ref Range Status   Specimen Description   Final    URINE, CLEAN CATCH Performed at [facility], [address] [place], [place] [address]    Special Requests   Final    NONE Reflexed from [id number] Performed at [facility], [address] [place], [place] [address]    Culture   Final    NO GROWTH Performed at [facility], [address] [place], [place] [address]     Report Status 03/19/2024 FINAL  Final  Resp panel by RT-PCR (RSV, Flu A&B, Covid) Anterior Nasal Swab     Status: None   Collection Time: 03/17/24  3:39 PM   Specimen: Anterior Nasal Swab  Result Value Ref Range Status   SARS Coronavirus 2 by RT PCR NEGATIVE NEGATIVE Final    Comment: (NOTE) SARS-CoV-2 target nucleic acids are NOT DETECTED.  The SARS-CoV-2 RNA is generally detectable in upper respiratory specimens during the acute phase of infection. The lowest concentration of SARS-CoV-2 viral copies this assay can detect is 138 copies/mL. A negative result does not preclude SARS-Cov-2 infection and should not be used as the sole basis for treatment or other patient management decisions. A negative result may occur with  improper specimen collection/handling, submission of specimen other than nasopharyngeal swab, presence of viral mutation(s) within the areas targeted by this assay, and inadequate number of viral copies(<138 copies/mL). A negative result must be combined with clinical observations, patient history, and epidemiological information. The expected result is Negative.  Fact Sheet for Patients:  BloggerCourse.com  Fact Sheet for Healthcare Providers:  SeriousBroker.it  This test is no t yet approved or cleared  by the [place]  FDA and  has been authorized for detection and/or diagnosis of SARS-CoV-2 by FDA under an Emergency Use Authorization (EUA). This EUA will remain  in effect (meaning this test can be used) for the duration of the COVID-19 declaration under Section 564(b)(1) of the Act, 21 U.S.C.section 360bbb-3(b)(1), unless the authorization is terminated  or revoked sooner.       Influenza A by PCR NEGATIVE NEGATIVE Final   Influenza B by PCR NEGATIVE NEGATIVE Final    Comment: (NOTE) The Xpert Xpress SARS-CoV-2/FLU/RSV plus assay is intended as an aid in the diagnosis of influenza from  Nasopharyngeal swab specimens and should not be used as a sole basis for treatment. Nasal washings and aspirates are unacceptable for Xpert Xpress SARS-CoV-2/FLU/RSV testing.  Fact Sheet for Patients: BloggerCourse.com  Fact Sheet for Healthcare Providers: SeriousBroker.it  This test is not yet approved or cleared by the [place]  FDA and has been authorized for detection and/or diagnosis of SARS-CoV-2 by FDA under an Emergency Use Authorization (EUA). This EUA will remain in effect (meaning this test can be used) for the duration of the COVID-19 declaration under Section 564(b)(1) of the Act, 21 U.S.C. section 360bbb-3(b)(1), unless the authorization is terminated or revoked.     Resp Syncytial Virus by PCR NEGATIVE NEGATIVE Final    Comment: (NOTE) Fact Sheet for Patients: BloggerCourse.com  Fact Sheet for Healthcare Providers: SeriousBroker.it  This test is not yet approved or cleared by the [place]  FDA and has been authorized for detection and/or diagnosis of SARS-CoV-2 by FDA under an Emergency Use Authorization (EUA). This EUA will remain in effect (meaning this test can be used) for the duration of the COVID-19 declaration under Section 564(b)(1) of the Act, 21 U.S.C. section 360bbb-3(b)(1), unless the authorization is terminated or revoked.  Performed at [facility], [address] [place], [place] [address]      Time coordinating discharge: Over 30 minutes  SIGNED:   Elsie JAYSON [place], DO [facility] 03/25/2024, 7:19 AM Pager   If 7PM-7AM, please contact night-coverage [email or web address]

## 2024-03-25 NOTE — TOC Progression Note (Signed)
 Transition of Care [facility]) - Progression Note   Patient Details  Name: Richard Stanley MRN: [id number] Date of Birth: Sep 25, 1935  Transition of [facility]) CM/SW Contact  Duwaine GORMAN Aran, LCSW Phone Number: 03/25/2024, 11:04 AM  Clinical Narrative: CSW met with wife to discuss discharging to daughter's home with hospice. Wife confirmed daughter moved in yesterday and she has been trying to reach her daughter to get the address. CSW followed up with Quantae at [facility] and she confirmed she is still waiting on the daughter's address so that DME can be delivered. CSW followed back up with wife and informed her she will need to call [employer] once she has the address.  Expected Discharge Plan: Home w Hospice Care Barriers to Discharge: Other (must enter comment) (awaiting dtr's address where patient will d/c to home in [place])  Expected Discharge Plan and Services In-house Referral: Clinical Social Work, Hospice / Palliative Care Post Acute Care Choice: Hospice Living arrangements for the past 2 months: Apartment           DME Arranged: N/A DME Agency: NA  Social Drivers of Health (SDOH) Interventions SDOH Screenings   Food Insecurity: No Food Insecurity (03/17/2024)  Housing: Low Risk  (03/17/2024)  Transportation Needs: No Transportation Needs (03/17/2024)  Utilities: Not At Risk (03/17/2024)  Financial Resource Strain: Low Risk  (02/17/2024)   Received from [facility]  Physical Activity: Unknown (05/13/2023)   Received from [facility]  Social Connections: Unknown (03/17/2024)  Stress: No Stress Concern Present (05/13/2023)   Received from [facility]  Tobacco Use: Medium Risk (03/17/2024)   Readmission Risk Interventions     No data to display

## 2024-03-25 NOTE — Progress Notes (Signed)
 Daily Progress Note   Patient Name: Richard Stanley       Date: 03/25/2024 DOB: [birth year]  Age: 88 y.o. MRN#: [id number] Attending Physician: Lue Elsie BROCKS, MD Primary Care Physician: Wallie Drafts, NP Admit Date: 03/17/2024 Length of Stay: 8 days  Reason for Consultation/Follow-up: Establishing goals of care  Subjective:   CC: Patient resting in bed Not in acute distress currently.    Following up regarding complex medical decision making.  Subjective:  Reviewed EMR including recent documentation from hospitalist.  Discharge Summary has been prepared.   Presented to bedside to see patient.  Patient's wife sitting at bedside.  Patient lethargic but awakens at times Not showing any non verbal gestures of distress or discomfort.    Patient with less myoclonic jerking at times.    Discharge plan remains for home with hospice in daughter's home in [place], [place]. Appreciate TOC follow up.   Continuing comfort focused care at this time.  Spent time providing emotional support via active listening.    Discussed end of life signs and symptoms.     Noted palliative medicine team continue to follow along with patient's medical journey.  Objective:   Vital Signs:  BP 131/62 (BP Location: Right Arm)   Pulse 71   Temp 98 F (36.7 C) (Oral)   Resp 16   Ht 6' (1.829 m)   Wt 62 kg   SpO2 91%   BMI 18.54 kg/m   Physical Exam: General: Ill-appearing, lethargic though will awake, hard of hearing, fewer myoclonic jerking   Cardiovascular: RRR Respiratory: no increased work of breathing noted, not in respiratory distress Abdomen: not distended Skin: Multiple ecchymoses on upper extremities bilaterally Neuro:   lethargic at times  Assessment & Plan:   Assessment: Patient is a 88 year old male with past medical history of atrial fibrillation, hypertension, CKD stage III, COPD, dementia, depression, hyperlipidemia, and aggressive bladder cancer who was admitted on 03/17/2024 for  management of AMS and generalized weakness.  Patient has history of TURBT by urology in March 2025 which showed concern and was recommended for rebiopsy as well as radical cystectomy though patient and wife declined this intervention; noted they wanted to focus on quality of life with his advanced age.  Since admission, patient receiving management for acute renal failure superimposed on CKD, metabolic acidosis, severe hyperkalemia, acute metabolic encephalopathy and myoclonic jerking likely secondary to uremia from acute renal failure, and possible urinary tract infection.  Urology, neurology, and nephrology consulted for recommendations.  Palliative medicine team consulted to assist with complex medical decision making.   Recommendations/Plan: # Complex medical decision making/goals of care:   -Patient unable to participate in medical decision making secondary to mental status.                - Discussed care with patient's wife at bedside today    Had transitioned to comfort focused care on 03/18/2024 as per wife's wishes for the patient.  Continuing comfort focused care at this time.        Palliative medicine team continuing to follow along with patient's medical journey.                - Have discontinued interventions that are no longer focused on comfort such as IV fluids, imaging, or lab work.  Concentrating on symptom management of pain, dyspnea, and agitation in the setting of end-of-life care.                  Code Status:  Do not attempt resuscitation (DNR) - Comfort care   # Symptom management Patient is receiving these palliative interventions for symptom management with an intent to improve quality of life.                   -Pain/Dyspnea, acute in the setting of end-of-life care                               -Continue IV Dilaudid 0.5-2 mg IV every 30 minutes as needed.  Continue to adjust based on patient's symptom burden.  If patient needing frequent dosing, may need to consider  continuous infusion.                  -Anxiety/agitation, in the setting of end-of-life care                               -Continue as needed Ativan.  Continue to adjust based on patient's symptom burden.                  -Secretions, in the setting of end-of-life care                               -Continue as needed glycopyrrolate.    # Psycho-social/Spiritual Support:  - Support System: Wife, adopted daughter, son who lives in [place]  - Desire for further Chaplain support:yes Appreciate chaplain follow up.   # Discharge Planning: To Be Determined  - Continuing comfort focused care at this time.    transfer to home with hospice at daughter's home in [place], [place].  [facility] note reviewed.  [facility] has been consulted to help with the home with hospice arrangements.    Discussed with:  patient's wife    Thank you for allowing the palliative care team to participate in the care [name]. Mod MDM Lonia Serve MD.  Palliative Care Provider PMT # [phone]  If patient remains symptomatic despite maximum doses, please call PMT at [phone] between 0700 and 1900. Outside of these hours, please call attending, as PMT does not have night coverage.

## 2024-03-26 DIAGNOSIS — N179 Acute kidney failure, unspecified: Secondary | ICD-10-CM | POA: Diagnosis not present

## 2024-03-28 NOTE — Progress Notes (Signed)
 I followed up with Devere, Santiel's wife, and provided support.  She appeared more at peace today and stated that she has accepted that he is at the end of his life and affirmed that she just wants him to be comfortable and at peace.  She is grateful for the care he has received here and will continue to stay beside him.

## 2024-03-29 NOTE — Progress Notes (Signed)
 TCM NURSING DOCUMENTATION              TCM Requirements for Post-Discharge Contact Deadlines:  Discharge Date:: 03-30-2024 7 calendar days post-discharge:: 04/02/2024 14 calendar days post-discharge:: 04/09/2024    Patient Name: Richard Stanley Patient DOB: [birth year] Patient Current Location:   Discharge diagnoses:  Primary discharge diagnosis:: Weakness UTR. Called and left VM.  Hello Campbell Kray, this is Schuyler Irving, RN from [employer]. I tried reaching you to check in on your progress after your recent visit, but I was unable to get in touch. Please give us  a call back at [phone] at your earliest convenience. Unfortunately, you cannot respond to this message at this time.  Thank you!

## 2024-04-09 NOTE — Progress Notes (Signed)
  Daily Progress Note   Patient Name: Richard Stanley       Date: 04/01/24 DOB: April 21, 1936  Age: 88 y.o. MRN#: [id number] Attending Physician: Lue Elsie BROCKS, MD Primary Care Physician: McClanahan, Kyra, NP Admit Date: 03/17/2024 Length of Stay: 9 days  Reason for Consultation/Follow-up: Establishing goals of care  Subjective:   CC: Patient pronounced dead earlier this morning, around 7:40 AM  Subjective:  Reviewed EMR including recent documentation from hospitalist.  Discharge Summary has been prepared.  TOC note reviewed 2 mg IV Dilaudid in the past 24 hours.   Presented to bedside to see patient.  Patient's wife sitting at bedside.  Patient has been pronounced dead, wife on phone making arrangements, briefly offered condolences.   Objective:  Pronounced dead Proper post mortem care is being arranged for.   Assessment & Plan:   Assessment: Patient is a 88 year old male with past medical history of atrial fibrillation, hypertension, CKD stage III, COPD, dementia, depression, hyperlipidemia, and aggressive bladder cancer who was admitted on 03/17/2024 for management of AMS and generalized weakness.  Patient has history of TURBT by urology in March 2025 which showed concern and was recommended for rebiopsy as well as radical cystectomy though patient and wife declined this intervention; noted they wanted to focus on quality of life with his advanced age.  Since admission, patient receiving management for acute renal failure superimposed on CKD, metabolic acidosis, severe hyperkalemia, acute metabolic encephalopathy and myoclonic jerking likely secondary to uremia from acute renal failure, and possible urinary tract infection.  Urology, neurology, and nephrology consulted for recommendations.  Palliative medicine team consulted to assist with complex medical decision making.   Recommendations/Plan:  Proper post mortem care Support for wife with assistance in making funeral home  arrangements and cremation   # Discharge Planning: in hospital death occurred earlier this am.     Discussed with:  patient's wife    Thank you for allowing the palliative care team to participate in the care Richard Stanley. Mod MDM Lonia Serve MD.  Palliative Care Provider PMT # [phone]  If patient remains symptomatic despite maximum doses, please call PMT at [phone] between 0700 and 1900. Outside of these hours, please call attending, as PMT does not have night coverage.

## 2024-04-09 NOTE — Death Summary Note (Signed)
 DEATH SUMMARY   Patient Details  Name: Richard Stanley MRN: [id number] DOB: 26-Oct-1935 ERE:FrRojwjyjw, Efrain, NP Admission/Discharge Information   Admit Date:  Mar 26, 2024  Date of Death: Date of Death: 04/04/2024  Time of Death: Time of Death: 0740  Length of Stay: 9   Principle Cause of death: Severe AKI, metabolic acidosis, hyperkalemia  Hospital Diagnoses: Principal Problem:   AKI (acute kidney injury) Active Problems:   CKD (chronic kidney disease), stage III (HCC)   HLD (hyperlipidemia)   COPD (chronic obstructive pulmonary disease) (HCC)   Essential hypertension   Atrial fibrillation, chronic (HCC)   Urothelial carcinoma of bladder (HCC)   Acute metabolic encephalopathy   Pain   High risk medication use   Hyperkalemia   Altered mental status   DNR (do not resuscitate)   Concern about end of life   ACP (advance care planning)   Counseling and coordination of care   Acute renal failure   Medication management   Palliative care encounter   Goals of care, counseling/discussion   Need for emotional support   Hospital Course: Patient admitted as above with worsening confusion at home found to have convulsive jerks.  At intake patient noted to have severe AKI on advanced CKD 3B with anion gap metabolic acidosis.  Patient also had profound hyperkalemia at intake.  Unfortunately patient's encephalopathy continue to be an ongoing issue as was his multiple lab abnormalities.  Patient has numerous chronic comorbid conditions as above and given his advanced age, comorbid conditions and worsening acute situation decision was made to transition patient to comfort measures for plan to discharge to outpatient hospice.  Unfortunately while patient was awaiting disposition to hospice he passed at our facility as above on 04/04/2024 at 0740.  Assessment and Plan: No notes have been filed under this hospital service. Service: Hospitalist  Severe AKI (acute kidney injury) superimposed  on CKD stage IIIb with hyperkalemia, AG metabolic acidosis - Baseline creatinine 1.6 on 02/08/2024, on admission creatinine 15.9-> 18.0 - CT abdomen showed moderate bilateral hydroureteronephrosis, ureters are dilated to the bladder insertion, no renal or ureteral calculi.  Enlarged prostate. -Foley catheter placed, initially creatinine improved after Foley placement however trending up again to 16.3  - Urology consulted, patient has aggressive bladder CA, likely the cause of bladder outlet obstruction.  Patient and his family had declined workup, treatment or surgery in the past. -Discussed with urology, recommended to continue Foley - will continue now for comfort   Severe hyperkalemia - Received albuterol  nebs, IV calcium  gluconate, sodium bicarb, Lokelma, received insulin/D50  - comfort care     Acute metabolic encephalopathy, myoclonic jerking movements likely due to uremia, #1  - Likely due to #1 - CT head showed no acute intracranial abnormality - Urine culture showed no growth - Comfort care     Atrial fibrillation, chronic, hypertension - Had history of cardioversion on 11/12/2021, follows Dr. Ladona  - HR in 50-60s, hold amiodarone  - Not on any anticoagulation - Comfort care     COPD (chronic obstructive pulmonary disease) (HCC) -Currently no wheezing, continue to follow    Anemia of chronic disease, normocytic - Baseline hemoglobin 13.0 on 02/08/2024,  - no active bleeding   History of aggressive urothelial carcinoma of bladder (HCC) - Now comfort care   Pressure Injury Documentation: POA Wound 03/21/2024 04-14-14 Pressure Injury Ischial tuberosity Left Stage 2 -  Partial thickness loss of dermis presenting as a shallow open injury with a red, pink wound bed without slough. (  Active)  -Continue wound care   Underweight Estimated body mass index is 18.54 kg/m as calculated from the following:   Height as of this encounter: 6' (1.829 m).   Weight as of this encounter: 62  kg.   The results of significant diagnostics from this hospitalization (including imaging, microbiology, ancillary and laboratory) are listed below for reference.   Significant Diagnostic Studies: CT ABDOMEN PELVIS WO CONTRAST Result Date: 03/17/2024 CLINICAL DATA:  New onset renal failure. EXAM: CT ABDOMEN AND PELVIS WITHOUT CONTRAST TECHNIQUE: Multidetector CT imaging of the abdomen and pelvis was performed following the standard protocol without IV contrast. RADIATION DOSE REDUCTION: This exam was performed according to the departmental dose-optimization program which includes automated exposure control, adjustment of the mA and/or kV according to patient size and/or use of iterative reconstruction technique. COMPARISON:  CT 07/10/2023 FINDINGS: Lower chest: Lower lobe bronchial thickening. Trace bilateral pleural effusions with adjacent atelectasis. The heart is normal in size. Hepatobiliary: No evidence of focal liver abnormality on this unenhanced exam. Gallstone is well as high density material within the gallbladder lumen. No pericholecystic inflammation. No biliary dilatation. Pancreas: No ductal dilatation or inflammation. Spleen: Normal in size without focal abnormality. Adrenals/Urinary Tract: No adrenal nodule. Moderate bilateral hydroureteronephrosis. Ureters are dilated to the bladder insertion. No renal or ureteral calculi. Simple cyst in the left kidney. The urinary bladder is decompressed by Foley catheter. Elevation of the Foley balloon in the bladder may be due to wall thickening or heterogeneous contents. Stomach/Bowel: The stomach is decompressed. No small bowel obstruction or inflammation. Proximal colon is decompressed. Moderate stool within the left colon. Sigmoid colonic diverticula without diverticulitis. Vascular/Lymphatic: Aortic atherosclerosis. No aortic aneurysm. No bulky lymphadenopathy. Reproductive: Enlarged prostate. Other: Generalized retroperitoneal stranding with trace  fluid. No free air. Mild generalized body wall edema. Musculoskeletal: Severe L1 compression fracture has progressive loss of height from January. Severe L3 compression fracture is unchanged. Mild superior endplate compression fracture of L5 is new from prior. Pronounced osteoporosis. Remote bilateral rib fractures. IMPRESSION: 1. Moderate bilateral hydroureteronephrosis. Ureters are dilated to the bladder insertion. No renal or ureteral calculi. 2. The urinary bladder is decompressed by Foley catheter. Elevation of the Foley balloon in the bladder may be due to wall thickening or heterogeneous contents. 3. Enlarged prostate. 4. Severe L1 compression fracture has progressive loss of height from January. Severe L3 compression fracture is chronic. Mild superior endplate compression fracture of L5 is new from prior. 5. Cholelithiasis without gallbladder inflammation. 6. Trace bilateral pleural effusions with adjacent atelectasis. Aortic Atherosclerosis (ICD10-I70.0). Electronically Signed   By: Andrea Gasman M.D.   On: 03/17/2024 16:52   CT Head Wo Contrast Result Date: 03/17/2024 CLINICAL DATA:  Altered mental status.  Weakness. EXAM: CT HEAD WITHOUT CONTRAST TECHNIQUE: Contiguous axial images were obtained from the base of the skull through the vertex without intravenous contrast. RADIATION DOSE REDUCTION: This exam was performed according to the departmental dose-optimization program which includes automated exposure control, adjustment of the mA and/or kV according to patient size and/or use of iterative reconstruction technique. COMPARISON:  02/08/2024 FINDINGS: Brain: No intracranial hemorrhage, mass effect, or midline shift. Stable atrophy and chronic small vessel ischemia. No hydrocephalus. The basilar cisterns are patent. No evidence of territorial infarct or acute ischemia. No extra-axial or intracranial fluid collection. Vascular: Atherosclerosis of skullbase vasculature without hyperdense vessel or  abnormal calcification. Skull: No fracture or focal lesion. Sinuses/Orbits: No acute finding. Other: None. IMPRESSION: 1. No acute intracranial abnormality. 2. Stable atrophy and chronic  small vessel ischemia. Electronically Signed   By: Andrea Gasman M.D.   On: 03/17/2024 14:30   DG Chest 1 View Result Date: 03/17/2024 CLINICAL DATA:  ams Patient BIB [facility] from home. Beginning last night has had generalized weakness. EXAM: CHEST  1 VIEW COMPARISON:  Chest XR, 06/11/2023.  CT chest, 03/20/2023. FINDINGS: Cardiac silhouette is within normal limits. Aortic arch atherosclerosis. Lungs are well inflated. No focal consolidation or mass. No pleural effusion or pneumothorax. Chronic-appearing RIGHT distal clavicular and multilevel RIGHT posterior rib fracture abnormalities. No acute displaced fracture. IMPRESSION: No acute cardiopulmonary process. Electronically Signed   By: Thom Hall M.D.   On: 03/17/2024 14:11    Microbiology: Recent Results (from the past 240 hours)  Urine Culture     Status: None   Collection Time: 03/17/24  3:06 PM   Specimen: Urine, Clean Catch  Result Value Ref Range Status   Specimen Description   Final    URINE, CLEAN CATCH Performed at [facility], [address] [place], [place] [address]    Special Requests   Final    NONE Reflexed from [id number] Performed at [facility], [address], [place], [place] [address]    Culture   Final    NO GROWTH Performed at [facility], [address], [place], [place] [address]    Report Status 03/19/2024 FINAL  Final  Resp panel by RT-PCR (RSV, Flu A&B, Covid) Anterior Nasal Swab     Status: None   Collection Time: 03/17/24  3:39 PM   Specimen: Anterior Nasal Swab  Result Value Ref Range Status   SARS Coronavirus 2 by RT PCR NEGATIVE NEGATIVE Final    Comment: (NOTE) SARS-CoV-2 target nucleic acids are NOT DETECTED.  The SARS-CoV-2 RNA is generally detectable in upper  respiratory specimens during the acute phase of infection. The lowest concentration of SARS-CoV-2 viral copies this assay can detect is 138 copies/mL. A negative result does not preclude SARS-Cov-2 infection and should not be used as the sole basis for treatment or other patient management decisions. A negative result may occur with  improper specimen collection/handling, submission of specimen other than nasopharyngeal swab, presence of viral mutation(s) within the areas targeted by this assay, and inadequate number of viral copies(<138 copies/mL). A negative result must be combined with clinical observations, patient history, and epidemiological information. The expected result is Negative.  Fact Sheet for Patients:  BloggerCourse.com  Fact Sheet for Healthcare Providers:  SeriousBroker.it  This test is no t yet approved or cleared by the [place]  FDA and  has been authorized for detection and/or diagnosis of SARS-CoV-2 by FDA under an Emergency Use Authorization (EUA). This EUA will remain  in effect (meaning this test can be used) for the duration of the COVID-19 declaration under Section 564(b)(1) of the Act, 21 U.S.C.section 360bbb-3(b)(1), unless the authorization is terminated  or revoked sooner.       Influenza A by PCR NEGATIVE NEGATIVE Final   Influenza B by PCR NEGATIVE NEGATIVE Final    Comment: (NOTE) The Xpert Xpress SARS-CoV-2/FLU/RSV plus assay is intended as an aid in the diagnosis of influenza from Nasopharyngeal swab specimens and should not be used as a sole basis for treatment. Nasal washings and aspirates are unacceptable for Xpert Xpress SARS-CoV-2/FLU/RSV testing.  Fact Sheet for Patients: BloggerCourse.com  Fact Sheet for Healthcare Providers: SeriousBroker.it  This test is not yet approved or cleared by the [place]  FDA and has been  authorized for detection and/or diagnosis of SARS-CoV-2 by FDA under an Emergency Use Authorization (EUA). This EUA will remain in effect (meaning this test can be used) for the duration of the COVID-19 declaration under Section 564(b)(1) of the Act, 21 U.S.C. section 360bbb-3(b)(1), unless the authorization is terminated or revoked.     Resp Syncytial Virus by PCR NEGATIVE NEGATIVE Final    Comment: (NOTE) Fact Sheet for Patients: BloggerCourse.com  Fact Sheet for Healthcare Providers: SeriousBroker.it  This test is not yet approved or cleared by the [place]  FDA and has been authorized for detection and/or diagnosis of SARS-CoV-2 by FDA under an Emergency Use Authorization (EUA). This EUA will remain in effect (meaning this test can be used) for the duration of the COVID-19 declaration under Section 564(b)(1) of the Act, 21 U.S.C. section 360bbb-3(b)(1), unless the authorization is terminated or revoked.  Performed at [facility], [address], [place], [place] [address]     Time spent: 55 min  Signed: Elsie Montclair DO  04-05-2024

## 2024-04-09 NOTE — Progress Notes (Signed)
 Patient passed away at 0740 this am w/ wife at bedside. TOD 0740-confirmed w/ Rexene CORDOBA RN. MD Lue made aware. Support provided to wife.

## 2024-04-09 DEATH — deceased
